# Patient Record
Sex: Female | Born: 1985 | Race: White | Hispanic: No | Marital: Married | Smoking: Never smoker
Health system: Southern US, Community
[De-identification: ages and names within clinical notes are randomized; demographics above are authoritative.]

## PROBLEM LIST (undated history)

## (undated) ENCOUNTER — Inpatient Hospital Stay: Payer: Self-pay

## (undated) DIAGNOSIS — G43909 Migraine, unspecified, not intractable, without status migrainosus: Secondary | ICD-10-CM

## (undated) DIAGNOSIS — Z9889 Other specified postprocedural states: Secondary | ICD-10-CM

## (undated) DIAGNOSIS — T7840XA Allergy, unspecified, initial encounter: Secondary | ICD-10-CM

## (undated) DIAGNOSIS — F419 Anxiety disorder, unspecified: Secondary | ICD-10-CM

## (undated) DIAGNOSIS — K219 Gastro-esophageal reflux disease without esophagitis: Secondary | ICD-10-CM

## (undated) DIAGNOSIS — C801 Malignant (primary) neoplasm, unspecified: Secondary | ICD-10-CM

## (undated) DIAGNOSIS — Z8614 Personal history of Methicillin resistant Staphylococcus aureus infection: Secondary | ICD-10-CM

## (undated) HISTORY — DX: Allergy, unspecified, initial encounter: T78.40XA

## (undated) HISTORY — PX: CHOLECYSTECTOMY: SHX55

## (undated) HISTORY — DX: Other specified postprocedural states: Z98.890

## (undated) HISTORY — PX: TONSILLECTOMY: SUR1361

---

## 2007-02-02 ENCOUNTER — Emergency Department: Payer: Self-pay | Admitting: Emergency Medicine

## 2007-05-24 ENCOUNTER — Observation Stay: Payer: Self-pay | Admitting: Obstetrics and Gynecology

## 2007-05-31 ENCOUNTER — Inpatient Hospital Stay (HOSPITAL_COMMUNITY): Admission: AD | Admit: 2007-05-31 | Discharge: 2007-05-31 | Payer: Self-pay | Admitting: Obstetrics and Gynecology

## 2007-05-31 ENCOUNTER — Encounter: Payer: Self-pay | Admitting: Emergency Medicine

## 2007-06-01 ENCOUNTER — Observation Stay: Payer: Self-pay | Admitting: Obstetrics and Gynecology

## 2007-06-25 ENCOUNTER — Observation Stay: Payer: Self-pay | Admitting: Obstetrics and Gynecology

## 2007-07-03 ENCOUNTER — Observation Stay: Payer: Self-pay

## 2007-07-05 ENCOUNTER — Observation Stay: Payer: Self-pay | Admitting: Certified Nurse Midwife

## 2007-07-06 ENCOUNTER — Observation Stay: Payer: Self-pay | Admitting: Certified Nurse Midwife

## 2007-07-10 ENCOUNTER — Inpatient Hospital Stay: Payer: Self-pay

## 2008-05-22 ENCOUNTER — Emergency Department: Payer: Self-pay | Admitting: Emergency Medicine

## 2008-05-24 ENCOUNTER — Emergency Department: Payer: Self-pay | Admitting: Emergency Medicine

## 2008-07-02 ENCOUNTER — Inpatient Hospital Stay: Payer: Self-pay | Admitting: Internal Medicine

## 2008-07-17 ENCOUNTER — Emergency Department: Payer: Self-pay | Admitting: Emergency Medicine

## 2008-09-14 IMAGING — CR DG KNEE 1-2V*L*
2 series · 2 of 2 positions shown · non-contrast
Comparison: none

CLINICAL DATA: Multiple trauma.
 LEFT KNEE ? 2 VIEW:

[view not recorded (1 of 2)]
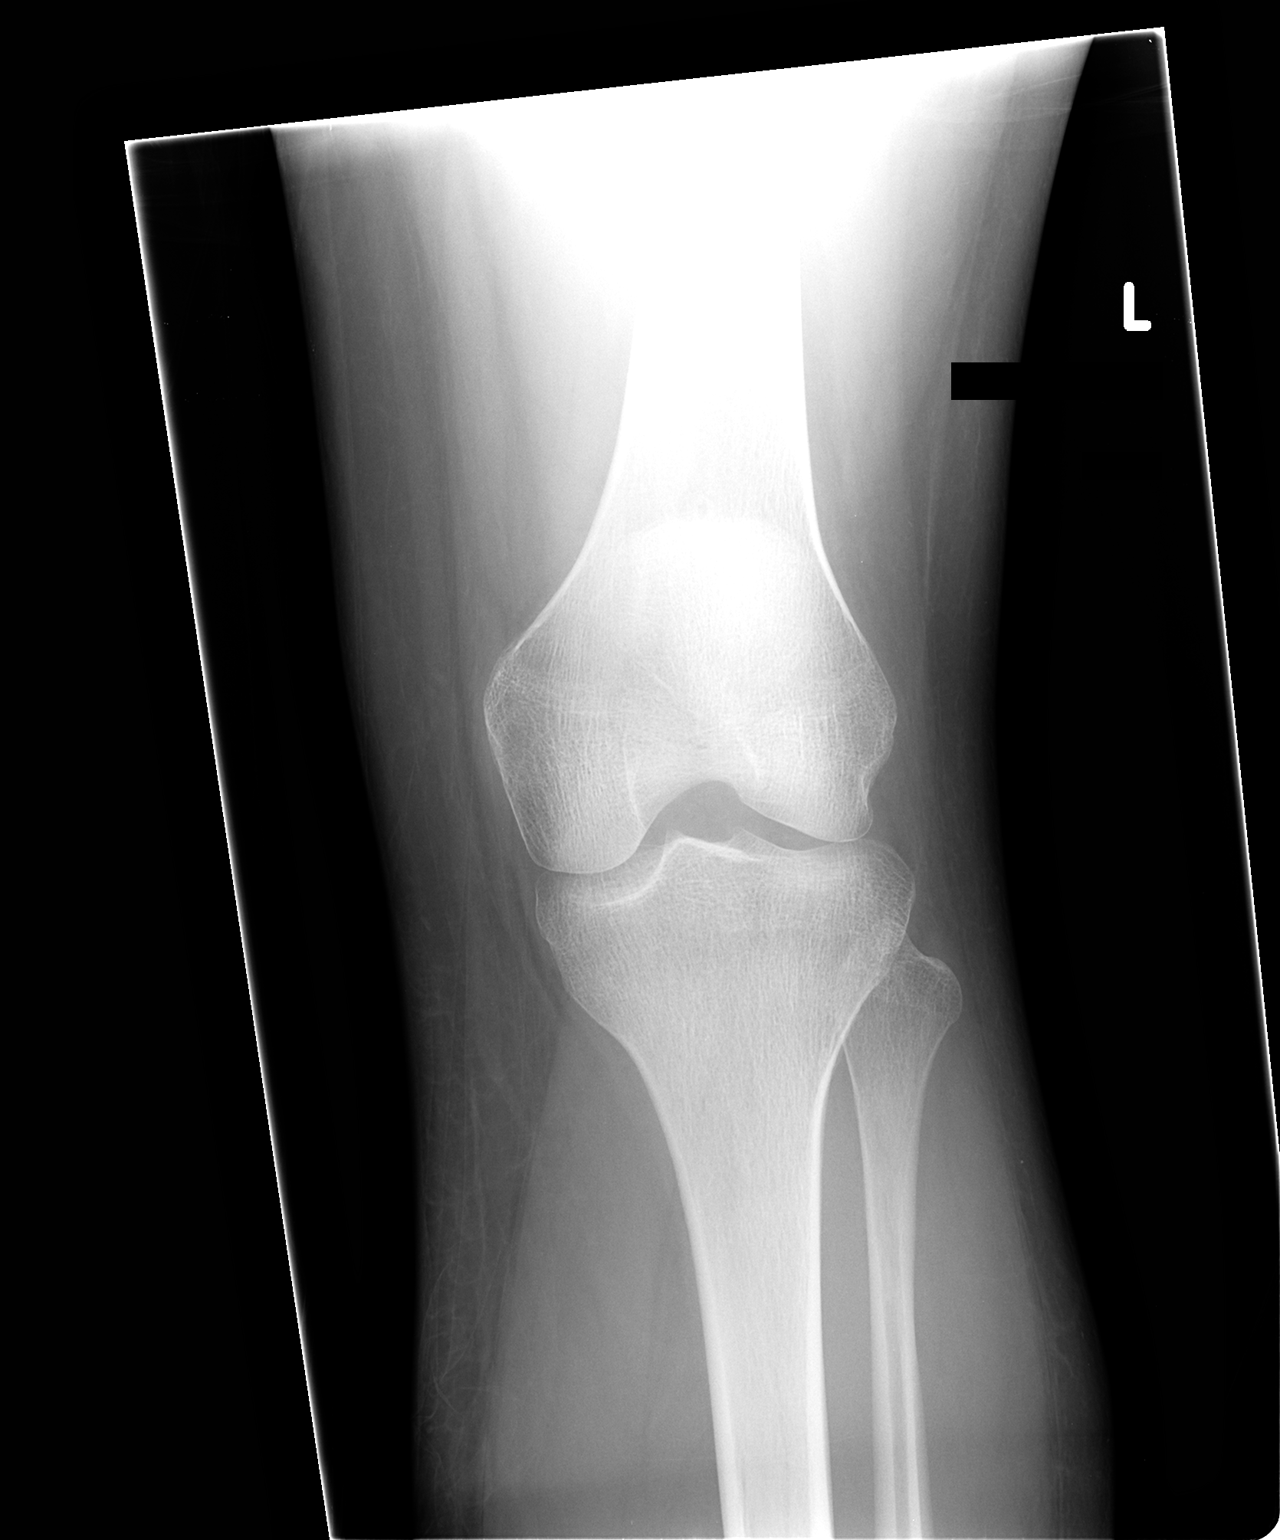

[view not recorded (2 of 2)]
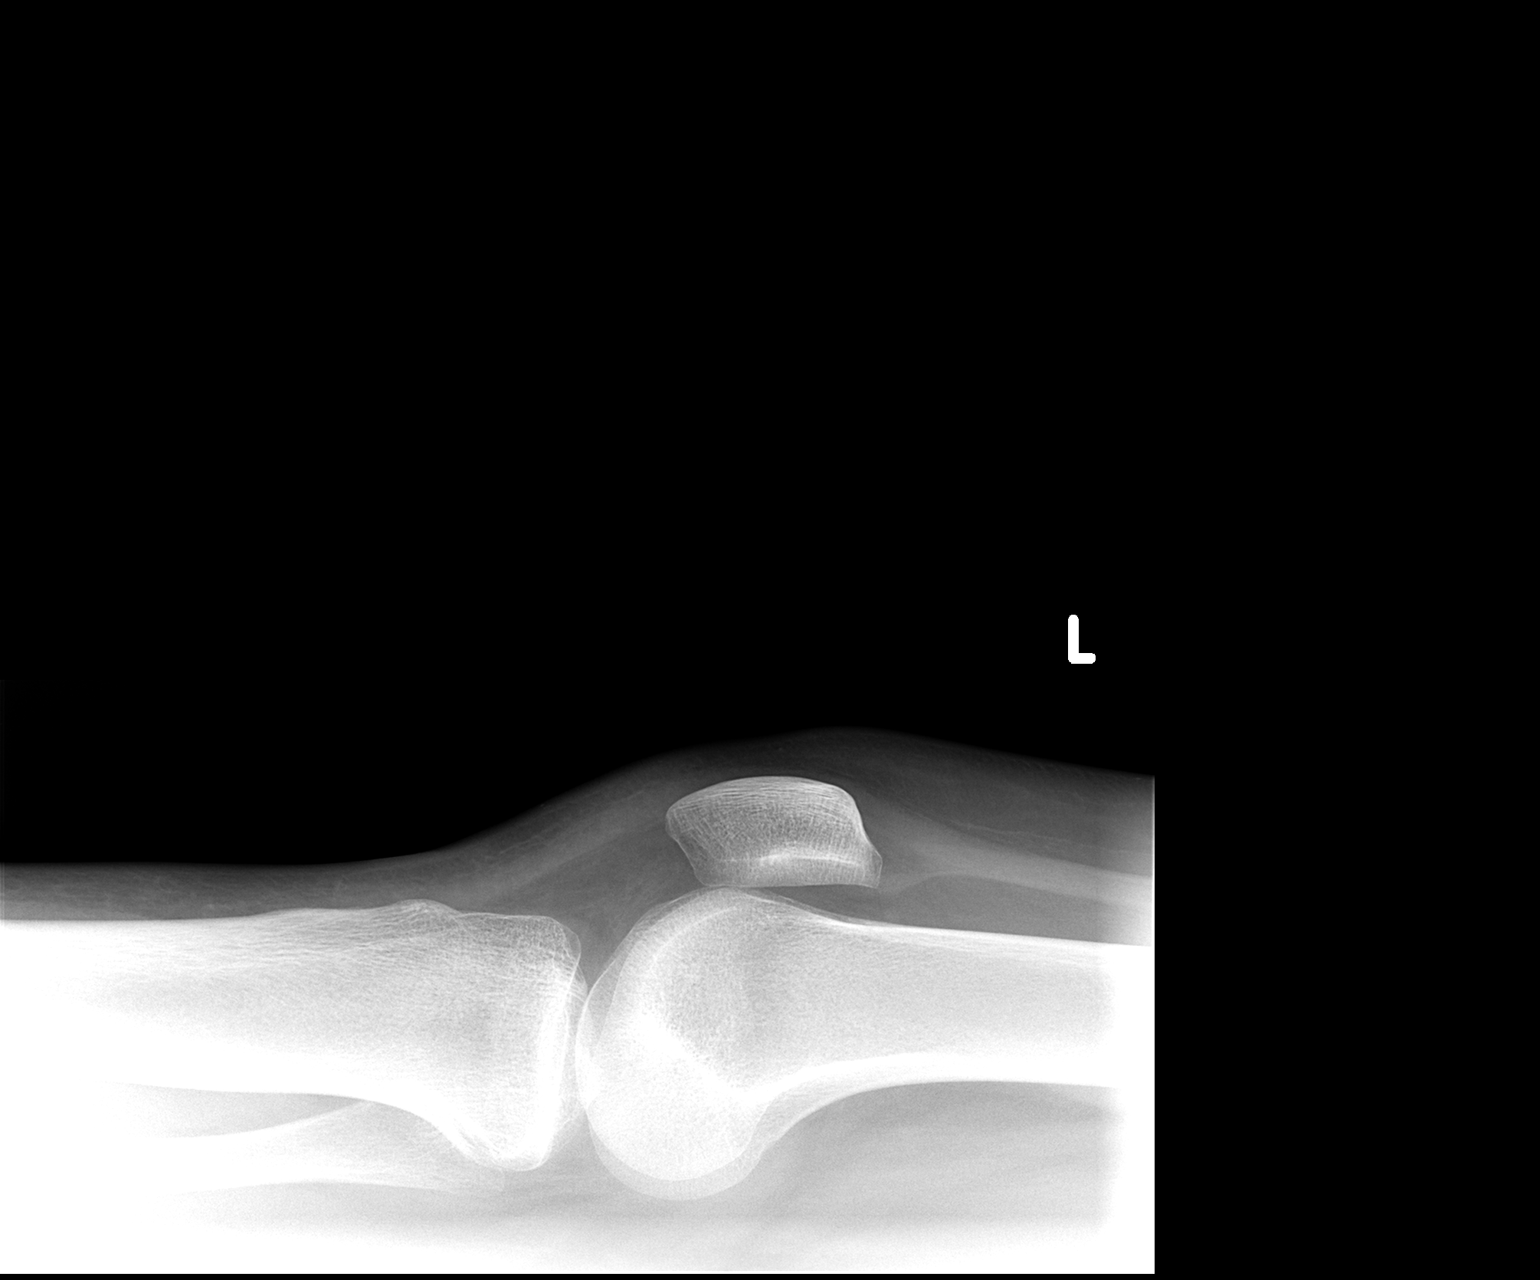

[2 of 2 positions shown; findings below may reference images not displayed]

FINDINGS: Two portable views of the right knee demonstrate joint spaces to be maintained.  No fractures are seen.  No joint effusion.
IMPRESSION: No acute bony findings.

## 2008-10-30 ENCOUNTER — Inpatient Hospital Stay: Payer: Self-pay | Admitting: Internal Medicine

## 2009-02-16 ENCOUNTER — Emergency Department: Payer: Self-pay | Admitting: Emergency Medicine

## 2011-06-28 LAB — CBC
MCHC: 34.4
Platelets: 303
RDW: 12.6

## 2011-06-28 LAB — I-STAT 8, (EC8 V) (CONVERTED LAB)
Acid-base deficit: 4 — ABNORMAL HIGH
Bicarbonate: 19.7 — ABNORMAL LOW
TCO2: 21
pCO2, Ven: 31.5 — ABNORMAL LOW
pH, Ven: 7.404 — ABNORMAL HIGH

## 2011-06-28 LAB — POCT I-STAT CREATININE: Operator id: 151321

## 2011-06-28 LAB — PROTIME-INR
INR: 0.9
Prothrombin Time: 12.5

## 2011-08-02 ENCOUNTER — Emergency Department: Payer: Self-pay | Admitting: Emergency Medicine

## 2012-03-29 ENCOUNTER — Emergency Department: Payer: Self-pay | Admitting: *Deleted

## 2014-02-04 ENCOUNTER — Emergency Department: Payer: Self-pay | Admitting: Emergency Medicine

## 2014-02-07 LAB — BETA STREP CULTURE(ARMC)

## 2014-10-20 ENCOUNTER — Emergency Department: Payer: Self-pay | Admitting: Emergency Medicine

## 2014-11-06 ENCOUNTER — Inpatient Hospital Stay: Payer: Self-pay | Admitting: Internal Medicine

## 2015-01-15 NOTE — Consult Note (Signed)
PATIENT NAME:  Alexandria Orozco, Alexandria Orozco MR#:  [id number] DATE OF BIRTH:  09-30-1985  DATE OF CONSULTATION:  11/06/2014  REFERRING PHYSICIAN:   CONSULTING PHYSICIAN:  Lupita Dawn. Koralyn Prestage, MD  REASON FOR REFERRAL: Acute abdominal pain with dilated common bile duct.   DESCRIPTION: The patient is a 29 year old white female with a known history of gallstone pancreatitis in 2009 followed by gallbladder surgery, then by ERCP after gallbladder surgery because of retained common bile duct stones. Biliary sphincterotomy with multiple stone extractions were done. The patient has been stable until now when she developed epigastric pain with nausea and vomiting for the past 2-3 days. Because of the worsening of symptoms the patient came to the Emergency Room for evaluation. During the workup ultrasound was done which showed a dilated common bile duct at 13 mm. White count was elevated at 11.7. I was asked to see the patient because of the symptoms. The patient remains nauseous and having persistent pain.   PAST MEDICAL HISTORY: Notable for gallstone pancreatitis, history of migraine headaches.   PAST SURGICAL HISTORY: Cholecystectomy and tonsillectomy.   ALLERGIES: SHE IS ALLERGIC TO PENICILLIN, SULFA, AND MORPHINE.   HOME MEDICATIONS: Imitrex.   FAMILY HISTORY: Negative for diabetes and heart condition.   SOCIAL HISTORY: Negative.   REVIEW OF SYSTEMS: No change from the admission. Please refer to this.   PHYSICAL EXAMINATION:  GENERAL: The patient is in no acute distress, but she is uncomfortable because of the pain.  VITAL SIGNS:  She is afebrile. Vital signs are stable except her heart rate is a little tachycardic. HEAD AND NECK: Within normal limits.  CARDIAC: Regular rhythm and rate.  LUNGS: Clear bilaterally.  ABDOMEN: Normoactive bowel sounds, soft. There is tenderness in the epigastrium. She had active bowel sounds. There is no hepatomegaly.  EXTREMITIES: No clubbing, cyanosis, or edema.     LABORATORY DATA:  In terms of her laboratories, electrolytes were completely normal except for chloride of 109 and glucose 119. Liver enzymes were completely normal. Troponin level is normal. Lipase was normal at 110. White count is slightly elevated at 11.7, hemoglobin 13.2, platelet count is 258,000.  An ultrasound did show dilated bile duct at 13 mm.   ASSESSMENT AND PLAN: This is a patient with acute epigastric pain, nausea, vomiting, who has had gallstones in the common bile duct before. The bile duct is dilated at 13 mm. Even though the liver enzymes are normal, I am concerned that there may be a retained stone present in the bile duct that is causing her symptoms. We will schedule an MRCP for tomorrow morning. If the MRCP is abnormal then we will proceed with ERCP.  I discussed the ERCP in detail with the patient.  Also discussed potential risks including recurrent pancreatitis, bleeding, reaction to sedation, etc.   Thank you for the referral.    ____________________________ Lupita Dawn. Candace Cruise, MD pyo:bu D: 11/09/2014 13:16:00 ET T: 11/09/2014 13:46:51 ET JOB#: [id number]  cc: Lupita Dawn. Candace Cruise, MD, <Dictator> Lupita Dawn Idonna Heeren MD ELECTRONICALLY SIGNED 11/14/2014 7:54

## 2015-01-15 NOTE — Consult Note (Signed)
Pt seen and examined. Full consult to follow. Pt with hx of gallstone pancreatitis in 2009, S/P GB surgery, and S/P ERCP after surgery to remove multiple CBD stones. Stable until few days ago, started having worsening epigastric pain with nausea. Thought LFT normal, CDB dilated to 41mm without obvious stones. Abdomen tender. Concern for possible retained CBD stone. Will order MRCP. Plan ERCP tomorrow afternoon if MRCP abnormal. Pt with PCN/sulfa allergy. Having pruritis from dilaudid? Will follow. thanks.  Electronic Signatures: Verdie Shire (MD)  (Signed on 21-Feb-16 10:29)  Authored  Last Updated: 21-Feb-16 10:29 by Verdie Shire (MD)

## 2015-01-15 NOTE — Consult Note (Signed)
MRCP showed no stone. However, pain persists. ERCP done. 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 Previous sphinterotomy  site has closed. No stone. Sphinterotomy done with good bile flow. Will see if pain subsides. Full liquid diet ordered. thanks.  Electronic Signatures: Verdie Shire (MD)  (Signed on 22-Feb-16 14:39)  Authored  Last Updated: 22-Feb-16 14:39 by Verdie Shire (MD)

## 2015-01-15 NOTE — Discharge Summary (Signed)
PATIENT NAME:  Alexandria Orozco, Alexandria Orozco MR#:  [id number] DATE OF BIRTH:  09/16/1986  DATE OF ADMISSION:  11/06/2014 DATE OF DISCHARGE:  11/12/2014  ADMITTING PHYSICIAN:  Juluis Mire, MD  DISCHARGING PHYSICIAN:   Gladstone Lighter, MD  PRIMARY CARE PHYSICIAN: None.   CONSULTATIONS IN THE HOSPITAL: GI consultation by Lupita Dawn. Candace Cruise, MD  DISCHARGE DIAGNOSES: 1.  Sepsis.  2.  Cholangitis.  3.  Post ERCP pancreatitis.  4.  Narrowed common bile duct, status post sphincterotomy.  5.  Migraine headaches.   DISCHARGE HOME MEDICATIONS:  1.  Imitrex 50 mg p.o. once a day as needed for migraines.  2.  Fioricet 1 tablet q. 8 hours p.r.n. for migraine prophylaxis.  3.  Levaquin 500 mg p.o. daily for 4 more days.  4.  Tramadol 50 mg p.o. q. 6 hours as needed for pain.   DISCHARGE DIET: Advised to start full liquid diet for the next 1-2 days before advancing to a regular diet.   DISCHARGE ACTIVITY: As tolerated.   FOLLOWUP INSTRUCTIONS: 1.  GI followup in 2-3 weeks if any concerns. 2.  PCP followup in 2 weeks.   LABORATORIES AND IMAGING STUDIES PRIOR TO DISCHARGE: Lipase was 526 prior to discharge.  WBC was normalized at 9.9, hemoglobin 10.6, hematocrit 31.8, platelet count 213,000. Sodium 143, potassium 3.5, chloride 107, bicarbonate 25, BUN 8, creatinine 0.68, glucose of 69, calcium of 7.9. ALT of 12, AST 24, alkaline phosphatase 49, total bilirubin 0.4, albumin of 2.8. Blood cultures remain negative. The patient had an MRCP done on 11/07/2014 showing no findings explaining the patient's extrahepatic biliary distention. No stones were seen, though the common bile duct was dilated at 13 mm and gradual tapering near the head of the pancreas noted. Lipase went as high as greater than 10,000 after her ERCP.   BRIEF HOSPITAL COURSE: Ms. Alexandria Orozco is a 28 year old young Caucasian female with past medical history significant for only migraine headaches, who presents to the hospital secondary to  worsening nausea, vomiting, and epigastric pain.  1.  CBD dilatation noted on ultrasound and also MRCP. The patient was status post cholecystectomy in the past. So, she was seen by GI and had an ERCP done on 11/07/2014, which showed that of unknown etiology, the bile duct was dilated and narrowed near the head of the pancreas, so she had sphincterotomy done without any stent placement. No stones were noted. Cholangiogram was normal.  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  Her symptoms, however, got worse after her procedure secondary to her pancreatitis from the procedure. She never had obstructive symptoms and her bilirubin and LFTs were within normal limits.  2.  Post ERCP pancreatitis. Developed pancreatitis post ERCP procedure. Lipase went as high as greater than 10,000 and now improved. The patient was able to tolerate a full liquid diet prior to discharge.  Lipase down to 500, but clinically much better.  3.  Possible sepsis secondary to cholangitis. The patient developed post ERCP pancreatitis while she was being treated.  She developed fevers, chills, tachycardia, and elevated white count. She was started on antibiotics, which improved her symptoms along with treatment of her pancreatitis. She developed likely cholangitis. Blood cultures remained negative. She is being discharged on Levaquin to finish up the course. 4.  Migraine headaches on Imitrex and Fioricet p.r.n.   Her course has been otherwise uneventful in the hospital.   DISCHARGE CONDITION: Stable.   DISCHARGE DISPOSITION: Home.   TIME SPENT ON DISCHARGE: 45 minutes.  ____________________________ Gladstone Lighter, MD rk:LT D: 11/13/2014 11:53:00 ET T: 11/13/2014 21:10:30 ET JOB#: [id number]  cc: Gladstone Lighter, MD, <Dictator> Lupita Dawn. Candace Cruise, MD Gladstone Lighter MD ELECTRONICALLY SIGNED 12/01/2014 17:50

## 2015-01-15 NOTE — Consult Note (Signed)
Pt improving, still mildly elevated lipase, some epigastric abd pain, not as bad as yesterday.  Starting clear liquids today, using incentive spirometry well.  I discussed recommended things she should do and not do after discharge.  I think she likely could go home tomorrow on full liquid diet and oral pain meds and nausea meds and call Monday for follow up appt with Dr. Candace Cruise.  She has seen Dr. Allen Norris in the past and I mentioned she could go with either doctor and she will see Dr. Candace Cruise since he is the last one she saw.  Electronic Signatures: Manya Silvas (MD)  (Signed on 26-Feb-16 14:14)  Authored  Last Updated: 26-Feb-16 14:14 by Manya Silvas (MD)

## 2015-01-15 NOTE — Consult Note (Signed)
Pt with post ERCP pancreatitis with drop in lipase from 10,000 to 1000.  Still with pain but trying not to take pain meds.  She has abd pain with deep breathing or coughing.  PE with clear lung base and nl heart exam, tender in upper abdomen.  Still not taking anything except sips of water.  I would not be too quick about discharge until she can eat without pain.  Doubt very much she will be ready for discharge tomorrow .Marland KitchenI am covering for Dr. Candace Cruise today.  Electronic Signatures: Manya Silvas (MD)  (Signed on 25-Feb-16 15:43)  Authored  Last Updated: 25-Feb-16 15:43 by Manya Silvas (MD)

## 2015-01-15 NOTE — Consult Note (Signed)
Chief Complaint:  Subjective/Chief Complaint Still with abdominal pain and nausea. Fever as well.   VITAL SIGNS/ANCILLARY NOTES: **Vital Signs.:   24-Feb-16 08:10  Vital Signs Type Q 8hr  Temperature Temperature (F) 102.7  Celsius 39.2  Temperature Source oral  Pulse Pulse 122  Respirations Respirations 22  Systolic BP Systolic BP 462  Diastolic BP (mmHg) Diastolic BP (mmHg) 70  Mean BP 80  Pulse Ox % Pulse Ox % 64  Pulse Ox Activity Level  At rest  Oxygen Delivery Room Air/ 21 %    10:32  Temperature Temperature (F) 98.8  Celsius 37.1  Temperature Source oral  Pulse Pulse 93  Pulse Ox % Pulse Ox % 95  Pulse Ox Activity Level  At rest  Oxygen Delivery 2L; Nasal Cannula   Brief Assessment:  GEN no acute distress   Cardiac Regular   Respiratory clear BS   Gastrointestinal decreased bowel sounds. Mildly tender.   Lab Results: Routine Chem:  23-Feb-16 20:03   Lipase  > 10000 (Result(s) reported on 08 Nov 2014 at 08:37PM.)  24-Feb-16 05:20   Lipase  9922 (Result(s) reported on 09 Nov 2014 at 06:36AM.)   Assessment/Plan:  Assessment/Plan:  Assessment Post ERCP pancreatitis. Unusual since pancreatic duct was never cannulated.   Plan Anyway, continue NPO. Increase IVF. Pain meds. Moniter lipase. Consider CT of pancreas if no clinical improvement over next few days. I have to be out next few days. Will have Dr. Vira Agar check on patient tomorrow. Thanks.   Electronic Signatures: Verdie Shire (MD)  (Signed 24-Feb-16 11:50)  Authored: Chief Complaint, VITAL SIGNS/ANCILLARY NOTES, Brief Assessment, Lab Results, Assessment/Plan   Last Updated: 24-Feb-16 11:50 by Verdie Shire (MD)

## 2015-01-15 NOTE — H&P (Signed)
PATIENT NAME:  Alexandria Orozco, Alexandria Orozco MR#:  [id number] DATE OF BIRTH:  23-Aug-1986  DATE OF ADMISSION:  11/06/2014  REFERRING PHYSICIAN: Elta Guadeloupe R. Jacqualine Code, M.D.   PRIMARY CARE PRACTITIONER: Nonlocal.   ADMITTING DOCTOR: Juluis Mire, M.D.   CHIEF COMPLAINT: Epigastric pain with associated nausea and vomiting of 2 days' duration.   HISTORY OF PRESENT ILLNESS: A 29 year old Caucasian female with a past medical history of gallstone pancreatitis status post cholecystectomy in the remote past, presents with the complaints of ongoing epigastric pain with associated nausea and vomiting of 2 days' duration. No history of any blood in the vomitus. No fever. No chills. Denies any chest pain. No shortness of breath. No dysuria, frequency, or urgency. In the Emergency Room, the patient was evaluated by the ED physician and was found to have  blood work which was essentially negative  except for a mildly elevated white blood cell count of 11.7. She underwent an ultrasound of the abdomen, which revealed dilated common bile duct and dilated at extrahepatic bile ducts. Surgery on call was contacted by the ED physician and advised recommendation to admit the patient to the medical team, and have a gastroenterology consultation. Hence, the hospitalist was consulted for further evaluation and management. The patient received  IV pain medications in the Emergency Room and currently her pain is under control. Her nausea is also under control with IV Zofran.   PAST MEDICAL HISTORY:  1.  Gallstone pancreatitis status post cholecystectomy in the past.  2.  Migraine headaches.   PAST SURGICAL HISTORY:  1.  Cholecystectomy.  2.  Tonsillectomy.   ALLERGIES: PENICILLIN, MORPHINE, AND SULFA.   HOME MEDICATIONS: Imitrex as needed for migraine headaches.   FAMILY HISTORY: Mom with diabetes mellitus and father with heart problems.   SOCIAL HISTORY: She is single and works at [employer]. She denies any history of smoking,  alcohol, or substance abuse.   REVIEW OF SYSTEMS:  CONSTITUTIONAL: Negative for fever, chills, fatigue, or generalized weakness.  EYES: Negative for blurred vision, double vision. No pain. No redness. No discharge.  EARS, NOSE, AND THROAT: Negative for any pain, hearing loss, epistaxis, or nasal discharge. RESPIRATORY: Negative for cough, wheezing, dyspnea, hemoptysis, or painful respiration.  CARDIOVASCULAR: Negative for chest pain, palpitations, dizziness, syncopal episodes, orthopnea, dyspnea on exertion, or pedal edema.  GASTROINTESTINAL: Positive for nausea, vomiting with epigastric pain as noted in the history of present illness of 2 day duration. No hematemesis. No diarrhea. No rectal bleeding.  GENITOURINARY: Negative for dysuria, frequency, urgency, or hematuria.  ENDOCRINE: Negative for polyuria, nocturia, heat or cold intolerance.  HEMATOLOGIC AND LYMPHATIC: Negative for anemia, easy bruising, bleeding, or swollen glands.  INTEGUMENTARY: Negative for acne, skin lesions, or rash.  MUSCULOSKELETAL: Negative for arthritis and gout.  NEUROLOGICAL: Negative for focal weakness, numbness. No history of CVA, transient ischemic attack, or seizure disorder.  PSYCHIATRIC: Negative for anxiety, insomnia, or depression.   PHYSICAL EXAMINATION:  VITAL SIGNS: Temperature 99.2, pulse rate 108 per minute, respirations 18 per minute, blood pressure 140/70.  GENERAL: Well-developed, well-nourished, alert, in no acute distress, comfortably resting in the bed at this time.  HEENT: Head atraumatic and normocephalic.  EYES: Pupils are equal, react to light and accommodation. No conjunctival pallor. No icterus. Extraocular movements intact.  NOSE: No drainage. No lesions.  EARS: No drainage. No external lesions.  ORAL CAVITY: No mucositis. No exudates.  NECK: Supple. No JVD. No thyromegaly. No carotid bruit. Range of motion of neck within normal limits.  RESPIRATORY:  Good respiratory effort. Not using  accessory muscles of respiration. Bilateral vesicular breath sounds. No rales or rhonchi.  CARDIOVASCULAR: S1, S2 regular. No murmurs, gallops, or clicks. Peripheral pulses are equal.  Carotid femoral pedal pulses, no peripheral edema.  GASTROINTESTINAL: Abdomen soft. Mild epigastric tenderness present. No guarding. No rigidity. Bowel sounds present and now clean and equal in all 4 quadrants.  GENITOURINARY: Deferred.  MUSCULOSKELETAL: No joint tenderness or effusion. Range of motion is adequate. Strength and tone equal bilaterally.  SKIN: Inspection within normal limits. No obvious wounds.  LYMPHATIC: No cervical lymphadenopathy.  VASCULAR: Good dorsalis pedis and posterior tibial pulses.  NEUROLOGICAL: Alert, awake, and oriented x3. Cranial nerves II-XII grossly intact. No sensory deficits.  Motor strength 5/5 in both upper and lower extremities. Plantars downgoing.  PSYCHIATRIC: Alert, awake, and oriented x3. Judgment and insight adequate. Memory and mood within normal limits.   ANCILLARY DATA:  LABORATORY DATA: Serum glucose 119, BUN 14, creatinine 0.93, sodium 141, potassium 3.6, chloride 109, bicarbonate 24, total calcium 8.4, lipase 110, total protein 7.5, albumin 3.6, total bilirubin 0.2, alkaline phosphatase 67, AST 26, ALT 18. Troponin less than 0.02. WBC 11.7, hemoglobin 13.2, hematocrit 38.1, platelet count 258,000. Urinalysis: Bacteria none, leukocyte esterase 1+, nitrite negative. WBC is 2.  IMAGING STUDIES: Ultrasound of the abdomen and right upper quadrant shows cholecystectomy, common bile duct enlargement of 13 mm, dilated extrahepatic bile duct of 13 mm.    EKG: Normal sinus rhythm with ventricular rate of 94 beats per minute, incomplete right bundle branch block.  ASSESSMENT AND PLAN: The patient is a 29 year old Caucasian female with a history of gallstone pancreatitis status post cholecystectomy in the remote past, now presents with the complaints of nausea, vomiting,  epigastric pain of 2 day duration, found to have common bile duct dilatation on ultrasound of abdomen 1.  Epigastric pain secondary to common bile duct stones with  dilation found on ultrasound of the abdomen.  Plan: Admit to medical floor, keep her n.p.o. except medications, IV fluids, IV pain control medications, and GI consultation for further evaluation.  2.  Deep vein thrombosis prophylaxis. Subcutaneous heparin. 3.  Gastrointestinal prophylaxis: Proton pump inhibitor.   CODE STATUS: Full code.   TIME SPENT: 45 minutes.    ____________________________ Juluis Mire, MD enr:mc D: 11/06/2014 07:13:40 ET T: 11/06/2014 11:15:45 ET JOB#: [id number]  cc: Juluis Mire, MD, <Dictator> Juluis Mire MD ELECTRONICALLY SIGNED 11/06/2014 14:27

## 2015-09-01 ENCOUNTER — Ambulatory Visit
Admission: EM | Admit: 2015-09-01 | Discharge: 2015-09-01 | Disposition: A | Discharge status: Home / Self Care | Payer: 59 | Attending: Family Medicine | Admitting: Family Medicine

## 2015-09-01 DIAGNOSIS — J069 Acute upper respiratory infection, unspecified: Secondary | ICD-10-CM | POA: Diagnosis not present

## 2015-09-01 LAB — HCG, QUANTITATIVE, PREGNANCY

## 2015-09-01 LAB — PREGNANCY, URINE: PREG TEST UR: NEGATIVE

## 2015-09-01 MED ORDER — BENZONATATE 100 MG PO CAPS: 100.0000 mg | ORAL_CAPSULE | Freq: Three times a day (TID) | ORAL | Status: DC | PRN

## 2015-09-01 MED ORDER — LORATADINE-PSEUDOEPHEDRINE ER 5-120 MG PO TB12: 1.0000 | ORAL_TABLET | Freq: Two times a day (BID) | ORAL | Status: AC

## 2015-09-01 NOTE — ED Notes (Signed)
Started Tuesday with cough. Yesterday noticed intermittent shooting pain just below left ear.

## 2015-09-01 NOTE — Discharge Instructions (Signed)
Rest, eat and drink regularly.   Follow up with your primary care physician this week as needed. Return to Urgent care for new or worsening concerns.   Upper Respiratory Infection, Adult Most upper respiratory infections (URIs) are a viral infection of the air passages leading to the lungs. A URI affects the nose, throat, and upper air passages. The most common type of URI is nasopharyngitis and is typically referred to as "the common cold." URIs run their course and usually go away on their own. Most of the time, a URI does not require medical attention, but sometimes a bacterial infection in the upper airways can follow a viral infection. This is called a secondary infection. Sinus and middle ear infections are common types of secondary upper respiratory infections. Bacterial pneumonia can also complicate a URI. A URI can worsen asthma and chronic obstructive pulmonary disease (COPD). Sometimes, these complications can require emergency medical care and may be life threatening.  CAUSES Almost all URIs are caused by viruses. A virus is a type of germ and can spread from one person to another.  RISKS FACTORS You may be at risk for a URI if:   You smoke.   You have chronic heart or lung disease.  You have a weakened defense (immune) system.   You are very young or very old.   You have nasal allergies or asthma.  You work in crowded or poorly ventilated areas.  You work in health care facilities or schools. SIGNS AND SYMPTOMS  Symptoms typically develop 2-3 days after you come in contact with a cold virus. Most viral URIs last 7-10 days. However, viral URIs from the influenza virus (flu virus) can last 14-18 days and are typically more severe. Symptoms may include:   Runny or stuffy (congested) nose.   Sneezing.   Cough.   Sore throat.   Headache.   Fatigue.   Fever.   Loss of appetite.   Pain in your forehead, behind your eyes, and over your cheekbones (sinus  pain).  Muscle aches.  DIAGNOSIS  Your health care provider may diagnose a URI by:  Physical exam.  Tests to check that your symptoms are not due to another condition such as:  Strep throat.  Sinusitis.  Pneumonia.  Asthma. TREATMENT  A URI goes away on its own with time. It cannot be cured with medicines, but medicines may be prescribed or recommended to relieve symptoms. Medicines may help:  Reduce your fever.  Reduce your cough.  Relieve nasal congestion. HOME CARE INSTRUCTIONS   Take medicines only as directed by your health care provider.   Gargle warm saltwater or take cough drops to comfort your throat as directed by your health care provider.  Use a warm mist humidifier or inhale steam from a shower to increase air moisture. This may make it easier to breathe.  Drink enough fluid to keep your urine clear or pale yellow.   Eat soups and other clear broths and maintain good nutrition.   Rest as needed.   Return to work when your temperature has returned to normal or as your health care provider advises. You may need to stay home longer to avoid infecting others. You can also use a face mask and careful hand washing to prevent spread of the virus.  Increase the usage of your inhaler if you have asthma.   Do not use any tobacco products, including cigarettes, chewing tobacco, or electronic cigarettes. If you need help quitting, ask your health care  provider. PREVENTION  The best way to protect yourself from getting a cold is to practice good hygiene.   Avoid oral or hand contact with people with cold symptoms.   Wash your hands often if contact occurs.  There is no clear evidence that vitamin C, vitamin E, echinacea, or exercise reduces the chance of developing a cold. However, it is always recommended to get plenty of rest, exercise, and practice good nutrition.  SEEK MEDICAL CARE IF:   You are getting worse rather than better.   Your symptoms are  not controlled by medicine.   You have chills.  You have worsening shortness of breath.  You have brown or red mucus.  You have yellow or brown nasal discharge.  You have pain in your face, especially when you bend forward.  You have a fever.  You have swollen neck glands.  You have pain while swallowing.  You have white areas in the back of your throat. SEEK IMMEDIATE MEDICAL CARE IF:   You have severe or persistent:  Headache.  Ear pain.  Sinus pain.  Chest pain.  You have chronic lung disease and any of the following:  Wheezing.  Prolonged cough.  Coughing up blood.  A change in your usual mucus.  You have a stiff neck.  You have changes in your:  Vision.  Hearing.  Thinking.  Mood. MAKE SURE YOU:   Understand these instructions.  Will watch your condition.  Will get help right away if you are not doing well or get worse.   This information is not intended to replace advice given to you by your health care provider. Make sure you discuss any questions you have with your health care provider.   Document Released: 02/26/2001 Document Revised: 01/17/2015 Document Reviewed: 12/08/2013 Elsevier Interactive Patient Education [employer].

## 2015-09-01 NOTE — ED Provider Notes (Signed)
[facility]  ____________________________________________  Time seen: Approximately 1:22 PM  I have reviewed the triage vital signs and the nursing notes.   HISTORY  Chief Complaint URI   HPI Alexandria Orozco is a 29 y.o. female  presents with spouse at bedside for the complaints of 3 days of runny nose, and nasal congestion, cough. Patient reports continues to eat and drink well. Denies and fevers. Denies current pain. Patient does report intermittent pain in bilateral ears left greater than right. States ear pain at max is 5 out of 10. Reports has not taken any over-the-counter medications for this.  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 Denies chest pain, shortness breath, headache, dizziness, neck or back pain. Denies dysuria or vaginal complaints. Denies abdominal pain.  Patient does report that her last menstrual was October 21 of this year, and reports not currently on any means to also control menstruals. Patient does report her and her spouse are actively trying to conceive. Reports she normally has a menstrual cycle every month but occasionally in the past her menstruals will be "off" and will not have a menstrual every month. Reports that she has had negative home pregnancy test.  History reviewed. No pertinent past medical history.  There are no active problems to display for this patient.  PCP: [facility]  Past Surgical History  Procedure Laterality Date  . Cholecystectomy    . Tonsillectomy      No current outpatient prescriptions on file.  Allergies Morphine and related; Penicillins; and Sulfa antibiotics  History reviewed. No pertinent family history.  Social History Social History  Substance Use Topics  . Smoking status: Never Smoker   . Smokeless tobacco: None  . Alcohol Use: No     Review of Systems Constitutional: No fever/chills Eyes: No visual changes. ENT:  positive runny nose, nasal congestion, cough. Denies sore throat.    Cardiovascular: Denies chest pain. Respiratory: Denies shortness of breath. Gastrointestinal: No abdominal pain.  No nausea, no vomiting.  No diarrhea.  No constipation. Genitourinary: Negative for dysuria. Musculoskeletal: Negative for back pain. Skin: Negative for rash. Neurological: Negative for headaches, focal weakness or numbness.  10-point ROS otherwise negative.  ____________________________________________   PHYSICAL EXAM:  VITAL SIGNS: ED Triage Vitals  Enc Vitals Group     BP 09/01/15 1251 127/80 mmHg     Pulse Rate 09/01/15 1251 98     Resp 09/01/15 1251 16     Temp 09/01/15 1251 99.4 F (37.4 C)     Temp Source 09/01/15 1251 Tympanic     SpO2 09/01/15 1251 100 %     Weight 09/01/15 1251 165 lb (74.844 kg)     Height 09/01/15 1251 5' (1.524 m)     Head Cir --      Peak Flow --      Pain Score 09/01/15 1255 5     Pain Loc --      Pain Edu? --      Excl. in [place]? --     Constitutional: Alert and oriented. Well appearing and in no acute distress. Eyes: Conjunctivae are normal. PERRL. EOMI. Head: Atraumatic. No frontal or maxillary sinus tenderness to palpation. No swelling. No erythema.  Ears: no erythema, normal TMs bilaterally. No exudate or drainage bilaterally.  Nose: Mild clear rhinorrhea with bilateral nasal turbinate erythema. Nares patent.  Mouth/Throat: Mucous membranes are moist.  Oropharynx non-erythematous. No swelling. No exudate.  No uvular shift or deviation. Neck: No stridor.  No cervical spine tenderness to palpation. Hematological/Lymphatic/Immunilogical: No cervical lymphadenopathy. Cardiovascular: Normal rate, regular rhythm. Grossly normal heart sounds.  Good peripheral circulation. Respiratory: Normal respiratory effort.  No retractions. Lungs CTAB. No wheezes, rales or rhonchi. Good air movement. Gastrointestinal: Soft and nontender. No distention. Normal Bowel sounds.    Musculoskeletal: No lower or upper extremity tenderness nor edema. Bilateral pedal pulses equal and easily palpated.  Neurologic:  Normal speech and language. No gross focal neurologic deficits are appreciated. No gait instability. Skin:  Skin is warm, dry and intact. No rash noted. Psychiatric: Mood and affect are normal. Speech and behavior are normal.  ____________________________________________   LABS (all labs ordered are listed, but only abnormal results are displayed)  Labs Reviewed  PREGNANCY, URINE  HCG, QUANTITATIVE, PREGNANCY     INITIAL IMPRESSION / [place] / ED COURSE  Pertinent labs & imaging results that were available during my care of the patient were reviewed by me and considered in my medical decision making (see chart for details).  Very well-appearing patient. No acute distress. Presents for complaint of 3 days of runny nose, nasal congestion and intermittent cough. Denies chest pain, shortness of breath or other complaints. Moist mucous membranes. Denies fevers. Lungs clear throughout. Abdomen soft and nontender. Suspect viral upper respiratory infection. Will plan to treat supportively and symptomatically. Patient also reports no menstrual since October and states that this is atypical for her. Will also evaluate hCG as patient reports she is actively trying to conceive.  Patient reports that she has to return to work prior to being able to wait for results of hCG. Patient states that she will call back for result.  Discussed follow up with Primary care physician this week. Discussed follow up and return parameters including no resolution or any worsening concerns. Encouraged patient to follow-up with her PCP or OB/GYN regarding abnormal menstrual. Patient verbalized understanding and agreed to plan. .  ____________________________________________   FINAL CLINICAL IMPRESSION(S) / ED DIAGNOSES  Final diagnoses:  URI (upper respiratory infection)     Addendum: 09/01/2015 1500  Serum hCG less than 1.   Patient called to follow-up on lab results, discuss lab results with patient. Also as discussed suspect viral upper respiratory infection and will treat symptomatically with oral Claritin D as well as when necessary Tessalon Perles. Encouraged food, fluids, rest and PCP follow-up.Discussed follow up with Primary care physician this week. Discussed follow up and return parameters including no resolution or any worsening concerns. Patient verbalized understanding and agreed to plan.     Marylene Land, NP 09/01/15 [place], NP 09/01/15 1536

## 2015-09-17 NOTE — L&D Delivery Note (Signed)
Delivery Note At 6:09 PM a viable female was delivered via Vaginal, Spontaneous Delivery (Presentation:ROA  ).  APGAR:pending; weight  pending.   Placenta status: spontaneous, intact.  Cord: 3VC without complications: .  Cord pH: N/A  Anesthesia:  Epidural Episiotomy:  none Lacerations:  none Suture Repair: N/A Est. Blood Loss (mL):  430mL  828mcg of cytotec placed rectally for some mild atony  Mom to postpartum.  Baby to Couplet care / Skin to Skin.  Alexandria Orozco, [place] 08/08/2016, 6:26 PM

## 2015-12-27 LAB — OB RESULTS CONSOLE HIV ANTIBODY (ROUTINE TESTING): HIV: NONREACTIVE

## 2015-12-28 LAB — OB RESULTS CONSOLE VARICELLA ZOSTER ANTIBODY, IGG: VARICELLA IGG: IMMUNE

## 2015-12-28 LAB — OB RESULTS CONSOLE ANTIBODY SCREEN: ANTIBODY SCREEN: NEGATIVE

## 2015-12-28 LAB — OB RESULTS CONSOLE ABO/RH: RH TYPE: POSITIVE

## 2015-12-28 LAB — OB RESULTS CONSOLE RUBELLA ANTIBODY, IGM: RUBELLA: IMMUNE

## 2015-12-28 LAB — OB RESULTS CONSOLE HEPATITIS B SURFACE ANTIGEN: HEP B S AG: NEGATIVE

## 2016-03-05 ENCOUNTER — Encounter: Payer: Self-pay | Admitting: Emergency Medicine

## 2016-03-05 ENCOUNTER — Emergency Department
Admission: EM | Admit: 2016-03-05 | Discharge: 2016-03-05 | Disposition: A | Discharge status: Home / Self Care | Payer: 59 | Attending: Emergency Medicine | Admitting: Emergency Medicine

## 2016-03-05 DIAGNOSIS — O2390 Unspecified genitourinary tract infection in pregnancy, unspecified trimester: Secondary | ICD-10-CM | POA: Diagnosis not present

## 2016-03-05 DIAGNOSIS — O26811 Pregnancy related exhaustion and fatigue, first trimester: Secondary | ICD-10-CM | POA: Diagnosis present

## 2016-03-05 DIAGNOSIS — B9689 Other specified bacterial agents as the cause of diseases classified elsewhere: Secondary | ICD-10-CM | POA: Insufficient documentation

## 2016-03-05 DIAGNOSIS — R8271 Bacteriuria: Secondary | ICD-10-CM

## 2016-03-05 DIAGNOSIS — Z3A17 17 weeks gestation of pregnancy: Secondary | ICD-10-CM | POA: Insufficient documentation

## 2016-03-05 DIAGNOSIS — O9989 Other specified diseases and conditions complicating pregnancy, childbirth and the puerperium: Secondary | ICD-10-CM

## 2016-03-05 DIAGNOSIS — R42 Dizziness and giddiness: Secondary | ICD-10-CM | POA: Insufficient documentation

## 2016-03-05 HISTORY — DX: Migraine, unspecified, not intractable, without status migrainosus: G43.909

## 2016-03-05 LAB — URINALYSIS COMPLETE WITH MICROSCOPIC (ARMC ONLY)
BILIRUBIN URINE: NEGATIVE
Glucose, UA: NEGATIVE mg/dL
Hgb urine dipstick: NEGATIVE
KETONES UR: NEGATIVE mg/dL
NITRITE: NEGATIVE
PH: 8 (ref 5.0–8.0)
PROTEIN: NEGATIVE mg/dL
SPECIFIC GRAVITY, URINE: 1.006 (ref 1.005–1.030)

## 2016-03-05 LAB — BASIC METABOLIC PANEL
ANION GAP: 6 (ref 5–15)
BUN: 7 mg/dL (ref 6–20)
CALCIUM: 9.4 mg/dL (ref 8.9–10.3)
CO2: 23 mmol/L (ref 22–32)
CREATININE: 0.6 mg/dL (ref 0.44–1.00)
Chloride: 108 mmol/L (ref 101–111)
Glucose, Bld: 103 mg/dL — ABNORMAL HIGH (ref 65–99)
Potassium: 3.4 mmol/L — ABNORMAL LOW (ref 3.5–5.1)
SODIUM: 137 mmol/L (ref 135–145)

## 2016-03-05 LAB — CBC
HCT: 35.4 % (ref 35.0–47.0)
HEMOGLOBIN: 12.5 g/dL (ref 12.0–16.0)
MCH: 31.6 pg (ref 26.0–34.0)
MCHC: 35.4 g/dL (ref 32.0–36.0)
MCV: 89.1 fL (ref 80.0–100.0)
PLATELETS: 274 10*3/uL (ref 150–440)
RBC: 3.97 MIL/uL (ref 3.80–5.20)
RDW: 13 % (ref 11.5–14.5)
WBC: 7.7 10*3/uL (ref 3.6–11.0)

## 2016-03-05 MED ORDER — DEXTROSE 5 % IV SOLN
1.0000 g | Freq: Once | INTRAVENOUS | Status: AC
  Administered 2016-03-05: 1 g via INTRAVENOUS
  Filled 2016-03-05: qty 10

## 2016-03-05 MED ORDER — CEPHALEXIN 500 MG PO CAPS: 500.0000 mg | ORAL_CAPSULE | Freq: Three times a day (TID) | ORAL | Status: AC

## 2016-03-05 MED ORDER — SODIUM CHLORIDE 0.9 % IV BOLUS (SEPSIS)
1000.0000 mL | Freq: Once | INTRAVENOUS | Status: AC
Start: 2016-03-05 — End: 2016-03-05
  Administered 2016-03-05: 1000 mL via INTRAVENOUS

## 2016-03-05 NOTE — ED Provider Notes (Signed)
CSN: [id number]     Arrival date & time 03/05/16  [id number] History   First MD Initiated Contact with Patient 03/05/16 1211     Chief Complaint  Patient presents with  . Dizziness     (Consider location/radiation/quality/duration/timing/severity/associated sxs/prior Treatment) The history is provided by the patient.  Alexandria Orozco is a 30 y.o. female [redacted] weeks pregnant here presenting with dizziness. She states that she feels dizzy like she is on pass out since yesterday. She also has some lower abdominal cramps but denies any vaginal bleeding. Denies any chest pain or shortness breath. She states that she has been eating and drinking well and has not have any vomiting. Denies any fevers or chills or urinary symptoms.   Past Medical History  Diagnosis Date  . Migraines    Past Surgical History  Procedure Laterality Date  . Cholecystectomy    . Tonsillectomy     History reviewed. No pertinent family history. Social History  Substance Use Topics  . Smoking status: Never Smoker   . Smokeless tobacco: None  . Alcohol Use: No   OB History    Gravida Para Term Preterm AB TAB SAB Ectopic Multiple Living   1              Review of Systems  Neurological: Positive for dizziness.  All other systems reviewed and are negative.     Allergies  Morphine and related; Penicillins; and Sulfa antibiotics  Home Medications   Prior to Admission medications   Medication Sig Start Date End Date Taking? Authorizing Provider  benzonatate (TESSALON PERLES) 100 MG capsule Take 1 capsule (100 mg total) by mouth 3 (three) times daily as needed for cough. 09/01/15   Marylene Land, NP   BP 122/71 mmHg  Pulse 73  Resp 18  SpO2 98%  LMP 07/07/2015 (Exact Date) Physical Exam  Constitutional: She is oriented to person, place, and time. She appears well-developed and well-nourished.  HENT:  Head: Normocephalic.  MM slightly dry   Eyes: Conjunctivae are normal. Pupils are equal, round, and  reactive to light.  Neck: Normal range of motion. Neck supple.  Cardiovascular: Normal rate, regular rhythm and normal heart sounds.   Pulmonary/Chest: Effort normal and breath sounds normal. No respiratory distress. She has no wheezes. She has no rales.  Abdominal: Soft. Bowel sounds are normal. She exhibits no distension. There is no tenderness.  Gravid uterus, nontender. No CVAT   Musculoskeletal: Normal range of motion.  Neurological: She is alert and oriented to person, place, and time. No cranial nerve deficit. Coordination normal.  Skin: Skin is warm and dry.  Psychiatric: She has a normal mood and affect. Her behavior is normal. Judgment and thought content normal.  Nursing note and vitals reviewed.   ED Course  Procedures (including critical care time)  EMERGENCY DEPARTMENT [place] PREGNANCY "Study: Limited Ultrasound of the Pelvis"  INDICATIONS:Pregnancy(required) Multiple views of the uterus and pelvic cavity are obtained with a multi-frequency probe.  APPROACH:Transabdominal   PERFORMED BY: Myself  IMAGES ARCHIVED?: Yes  LIMITATIONS: Body habitus  PREGNANCY FREE FLUID: None  PREGNANCY UTERUS FINDINGS:Uterus enlarged and Gestational sac noted ADNEXAL FINDINGS:Left ovary not seen and Right ovary not seen  PREGNANCY FINDINGS: Intrauterine gestational sac noted, Yolk sac noted, Fetal pole present and Fetal heart activity seen  INTERPRETATION: Viable intrauterine pregnancy  GESTATIONAL AGE, ESTIMATE: 17 weeks  FETAL HEART RATE: 167  COMMENT(Estimate of Gestational Age):  Normal IUP      Labs Review Labs Reviewed  BASIC METABOLIC PANEL - Abnormal; Notable for the following:    Potassium 3.4 (*)    Glucose, Bld 103 (*)    All other components within normal limits  URINALYSIS COMPLETEWITH MICROSCOPIC ([facility] ONLY) - Abnormal; Notable for the following:    Color, Urine STRAW (*)    APPearance CLEAR (*)    Leukocytes, UA 2+ (*)    Bacteria, UA RARE (*)     Squamous Epithelial / LPF 0-5 (*)    All other components within normal limits  URINE CULTURE  CBC  CBG MONITORING, ED    Imaging Review No results found. I have personally reviewed and evaluated these images and lab results as part of my medical decision-making.   EKG Interpretation None       ED ECG REPORT I, Tressa Maldonado, the attending physician, personally viewed and interpreted this ECG.   Date: 03/05/2016  EKG Time: 10:04 am  Rate: 72  Rhythm: normal EKG, normal sinus rhythm  Axis: normal  Intervals:right bundle branch block  ST&T Change: nonspecific, unchanged 11/06/14    MDM   Final diagnoses:  None    Alexandria Orozco is a 30 y.o. female here with dizziness, lightheadedness. Bedside US confirmed IUP. Has no vaginal bleeding or vomiting. Will check orthostatics, hydrate and reassess.   2:14 PM Patient borderline orthostatic initially, given IVF. UA  2+ leuks with some bacteria. Recent finished zpack for sinusitis but still has sinus congestion. WBC nl. Well appearing, doesn't appear septic. No CVA tenderness to suggest pyelo. Will dc home with keflex.     Wandra Arthurs, MD 03/05/16 1415

## 2016-03-05 NOTE — Discharge Instructions (Signed)
Take keflex three times daily for a week.   Stay hydrated.   See your OB doctor.   Return to ER if you have worse abdominal pain, vomiting, fever, vaginal bleeding

## 2016-03-05 NOTE — ED Notes (Signed)
Pt to ed with c/o dizziness and lightheadedness that started yesterday.  Pt reports she is approximately [redacted] weeks pregnant.  Pt states "I feel like I am going to pass out, so my dr said I should come and be checked out"

## 2016-03-07 LAB — URINE CULTURE: Culture: 50000 — AB

## 2016-04-10 ENCOUNTER — Observation Stay
Admission: EM | Admit: 2016-04-10 | Discharge: 2016-04-10 | Disposition: A | Discharge status: Home / Self Care | Payer: 59 | Attending: Obstetrics and Gynecology | Admitting: Obstetrics and Gynecology

## 2016-04-10 ENCOUNTER — Observation Stay
Admission: EM | Admit: 2016-04-10 | Discharge: 2016-04-11 | Disposition: A | Discharge status: Home / Self Care | Payer: 59 | Source: Home / Self Care | Admitting: Obstetrics and Gynecology

## 2016-04-10 DIAGNOSIS — O99412 Diseases of the circulatory system complicating pregnancy, second trimester: Principal | ICD-10-CM | POA: Insufficient documentation

## 2016-04-10 DIAGNOSIS — Z880 Allergy status to penicillin: Secondary | ICD-10-CM | POA: Diagnosis not present

## 2016-04-10 DIAGNOSIS — R55 Syncope and collapse: Secondary | ICD-10-CM | POA: Diagnosis not present

## 2016-04-10 DIAGNOSIS — Z885 Allergy status to narcotic agent status: Secondary | ICD-10-CM | POA: Insufficient documentation

## 2016-04-10 DIAGNOSIS — Z3A22 22 weeks gestation of pregnancy: Secondary | ICD-10-CM | POA: Diagnosis not present

## 2016-04-10 DIAGNOSIS — Z882 Allergy status to sulfonamides status: Secondary | ICD-10-CM | POA: Diagnosis not present

## 2016-04-10 DIAGNOSIS — O9989 Other specified diseases and conditions complicating pregnancy, childbirth and the puerperium: Secondary | ICD-10-CM | POA: Diagnosis present

## 2016-04-10 DIAGNOSIS — R109 Unspecified abdominal pain: Secondary | ICD-10-CM

## 2016-04-10 DIAGNOSIS — O26899 Other specified pregnancy related conditions, unspecified trimester: Secondary | ICD-10-CM

## 2016-04-10 LAB — GLUCOSE, CAPILLARY: GLUCOSE-CAPILLARY: 87 mg/dL (ref 65–99)

## 2016-04-10 NOTE — Discharge Summary (Signed)
Obstetric History and Physical  Alexandria Orozco is a 30 y.o. G2P0101 with Estimated Date of Delivery: 08/13/16  who presents at [redacted]w[redacted]d  presenting for passing out x 2 today, once while standing and once while sitting. Reports h/o dizzy episodes earlier in pregnancy and episodes of rapid heartrate. Pt ate poptart, chocolate milk and lemonade plus a large glass or water this am. Patient states she has been having no contractions, no vaginal bleeding, intact membranes.     Prenatal Course Source of Care: [facility]    Pregnancy complications or risks: Patient Active Problem List   Diagnosis Date Noted  . Syncope and collapse 04/10/2016      Prenatal Transfer Tool   Past Medical History:  Diagnosis Date  . Migraines     Past Surgical History:  Procedure Laterality Date  . CHOLECYSTECTOMY    . TONSILLECTOMY      OB History  Gravida Para Term Preterm AB Living  2 1   1   1   SAB TAB Ectopic Multiple Live Births          1    # Outcome Date GA Lbr Len/2nd Weight Sex Delivery Anes PTL Lv  2 Current           1 Preterm 07/10/07    M Vag-Spont  Y LIV      Social History   Social History  . Marital status: Married    Spouse name: N/A  . Number of children: N/A  . Years of education: N/A   Social History Main Topics  . Smoking status: Never Smoker  . Smokeless tobacco: Never Used  . Alcohol use No  . Drug use: No  . Sexual activity: Yes   Other Topics Concern  . None   Social History Narrative  . None    History reviewed. No pertinent family history.  Prescriptions Prior to Admission  Medication Sig Dispense Refill Last Dose  . butalbital-acetaminophen-caffeine (FIORICET, ESGIC) 50-325-40 MG tablet Take 2 tablets by mouth every 4 (four) hours as needed for headache.   Past Week at Unknown time  . Prenatal Vit-Fe Fumarate-FA (PRENATAL MULTIVITAMIN) TABS tablet Take 1 tablet by mouth daily at 12 noon.       Allergies  Allergen Reactions  . Morphine And Related  Itching  . Penicillins Rash  . Sulfa Antibiotics Rash    Review of Systems: Negative except for what is mentioned in HPI.  Physical Exam: Vitals:   04/10/16 1227 04/10/16 1247 04/10/16 1302 04/10/16 1307  BP: (!) 105/49 (!) 111/59 (!) 95/28 114/88  Pulse: 88 80 (!) 142 65  *values from 1302 likely inaccurate, pt states cuff was on her elbow  BP 114/88   Pulse 65   LMP 11/07/2015  GENERAL: Well-developed, well-nourished female in no acute distress.  LUNGS: Clear to auscultation bilaterally.  HEART: Regular rate and rhythm. ABDOMEN: Soft, nontender, nondistended, gravid. EXTREMITIES: Nontender, no edema Cervical Exam: deferred  FHT: + FHTs     Pertinent Labs/Studies:   Results for orders placed or performed during the hospital encounter of 04/10/16 (from the past 24 hour(s))  Glucose, capillary     Status: None   Collection Time: 04/10/16 12:33 PM  Result Value Ref Range   Glucose-Capillary 87 65 - 99 mg/dL    EKG - reviewed by hospitalist Dr Vianne Bulls, normal sinus rhythm  Assessment : IUP at [redacted]w[redacted]d with syncopal episode  Plan:  Reviewed diet this am low in protein - encouraged to eat balanced  diet with protein and regular snacks. Continue po fluids.   Discharge Home

## 2016-04-10 NOTE — Discharge Summary (Signed)
Patient discharged home, discharge instructions given, patient states understanding. Patient left floor in stable condition, denies any other needs at this time. Patient to keep next scheduled OB appointment 

## 2016-04-10 NOTE — Progress Notes (Signed)
Called by labor and delivery register nurse to review the EKG. Patient came in for syncope. Patient's EKG shows normal sinus rhythm without any ST-T changes no prolongation of QT intervals. Rate 74 bpm.

## 2016-04-10 NOTE — Discharge Instructions (Signed)
Drink plenty of fluids, get plenty of rest, call your provider for any other concerns.

## 2016-04-10 NOTE — OB Triage Note (Signed)
Presents to BP for c/o dizziness and feeling lightheaded and then blacked out x2 today.  Patient denies any vaginal bleeding or leaking of fluid today, however does report spotting yesterday.  Patient reports cramping today, denies any pain with urination, urgency, or retention. Patient reports fetal movement.

## 2016-04-11 ENCOUNTER — Emergency Department
Admission: EM | Admit: 2016-04-11 | Discharge: 2016-04-11 | Disposition: A | Discharge status: Home / Self Care | Payer: 59 | Attending: Emergency Medicine | Admitting: Emergency Medicine

## 2016-04-11 ENCOUNTER — Emergency Department: Payer: 59

## 2016-04-11 DIAGNOSIS — R42 Dizziness and giddiness: Secondary | ICD-10-CM | POA: Diagnosis not present

## 2016-04-11 DIAGNOSIS — O99412 Diseases of the circulatory system complicating pregnancy, second trimester: Secondary | ICD-10-CM | POA: Diagnosis not present

## 2016-04-11 DIAGNOSIS — O26892 Other specified pregnancy related conditions, second trimester: Secondary | ICD-10-CM | POA: Insufficient documentation

## 2016-04-11 DIAGNOSIS — R55 Syncope and collapse: Secondary | ICD-10-CM

## 2016-04-11 DIAGNOSIS — R109 Unspecified abdominal pain: Secondary | ICD-10-CM

## 2016-04-11 DIAGNOSIS — O26899 Other specified pregnancy related conditions, unspecified trimester: Secondary | ICD-10-CM

## 2016-04-11 DIAGNOSIS — Z3A22 22 weeks gestation of pregnancy: Secondary | ICD-10-CM | POA: Diagnosis not present

## 2016-04-11 DIAGNOSIS — R102 Pelvic and perineal pain: Secondary | ICD-10-CM | POA: Diagnosis not present

## 2016-04-11 LAB — CBC
HCT: 38 % (ref 35.0–47.0)
Hemoglobin: 13.2 g/dL (ref 12.0–16.0)
MCH: 31.6 pg (ref 26.0–34.0)
MCHC: 34.6 g/dL (ref 32.0–36.0)
MCV: 91.3 fL (ref 80.0–100.0)
PLATELETS: 284 10*3/uL (ref 150–440)
RBC: 4.17 MIL/uL (ref 3.80–5.20)
RDW: 13.5 % (ref 11.5–14.5)
WBC: 11.8 10*3/uL — AB (ref 3.6–11.0)

## 2016-04-11 LAB — URINALYSIS COMPLETE WITH MICROSCOPIC (ARMC ONLY)
Bilirubin Urine: NEGATIVE
GLUCOSE, UA: NEGATIVE mg/dL
HGB URINE DIPSTICK: NEGATIVE
KETONES UR: NEGATIVE mg/dL
Nitrite: NEGATIVE
PH: 7 (ref 5.0–8.0)
PROTEIN: NEGATIVE mg/dL
SPECIFIC GRAVITY, URINE: 1.008 (ref 1.005–1.030)

## 2016-04-11 LAB — BASIC METABOLIC PANEL
Anion gap: 8 (ref 5–15)
BUN: 10 mg/dL (ref 6–20)
CALCIUM: 8.7 mg/dL — AB (ref 8.9–10.3)
CO2: 22 mmol/L (ref 22–32)
CREATININE: 0.5 mg/dL (ref 0.44–1.00)
Chloride: 105 mmol/L (ref 101–111)
GFR calc Af Amer: >60 mL/min (ref >60)
GLUCOSE: 93 mg/dL (ref 65–99)
Potassium: 3.5 mmol/L (ref 3.5–5.1)
SODIUM: 135 mmol/L (ref 135–145)

## 2016-04-11 LAB — TROPONIN I: Troponin I: <0.03 ng/mL (ref <0.03)

## 2016-04-11 NOTE — Discharge Instructions (Signed)
Please get plenty of water and rest.  Discharge instructions given.

## 2016-04-11 NOTE — ED Notes (Signed)
Patient transported to Ultrasound 

## 2016-04-11 NOTE — OB Triage Note (Signed)
Pt arrived to obs rm 3 with c/o lightheadedness and lower abdominal cramps lasting all afternoon.  Was having some vaginal spotting yesterday but none today.  Also having leg numbness. Pt placed on monitors and oriented to room.

## 2016-04-11 NOTE — ED Triage Notes (Signed)
Pt was seen earlier today and was up in L&D all day.  Husband reports that [place] the [place] and blacked out.  Pt was driving back to work and then had to pull over and was getting dizzy and sick all over again.  Husband reports that pt has not been acting right over the past 24 hours.  Pt reports no actual vomiting, but nausea associated with the dizziness.

## 2016-04-11 NOTE — ED Notes (Signed)
Pt back from US

## 2016-04-11 NOTE — ED Provider Notes (Signed)
[facility] Emergency Department Provider Note  ____________________________________________  Time seen: 3:30 AM  I have reviewed the triage vital signs and the nursing notes.   HISTORY  Chief Complaint Dizziness     HPI Alexandria Orozco is a 30 y.o. female [redacted] weeks pregnant presents from labor and delivery with history of 2 syncopal episodes today. Patient states first episode occurred at approximately 8 AM this morning. Patient states no preceding symptoms. She denies any chest pain no nausea no vomiting or diarrhea. Patient denies any tremulousness or palpitations. Patient states approximately 30 minutes after the event she checked her glucose level which was 83. Patient states that she ate 2 Pop tarts 5 minutes before syncopal event.    Past Medical History:  Diagnosis Date  . Migraines     Patient Active Problem List   Diagnosis Date Noted  . Abdominal pain affecting pregnancy 04/11/2016  . Syncope and collapse 04/10/2016    Past Surgical History:  Procedure Laterality Date  . CHOLECYSTECTOMY    . TONSILLECTOMY      Current Outpatient Rx  . Order #: [id number] Class: Normal  . Order #: [id number] Class: Historical Med  . Order #: [id number] Class: Historical Med    Allergies Morphine and related; Penicillins; and Sulfa antibiotics  No family history on file.  Social History Social History  Substance Use Topics  . Smoking status: Never Smoker  . Smokeless tobacco: Never Used  . Alcohol use No    Review of Systems  Constitutional: Negative for fever. Eyes: Negative for visual changes. ENT: Negative for sore throat. Cardiovascular: Negative for chest pain. Respiratory: Negative for shortness of breath. Gastrointestinal: Negative for abdominal pain, vomiting and diarrhea. Genitourinary: Negative for dysuria. Musculoskeletal: Negative for back pain. Skin: Negative for rash. Neurological: Negative for headaches, focal weakness or  numbness.Positive for syncope   10-point ROS otherwise negative.  ____________________________________________   PHYSICAL EXAM:  VITAL SIGNS: ED Triage Vitals  Enc Vitals Group     BP 04/11/16 0300 103/64     Pulse Rate 04/11/16 0300 68     Resp 04/11/16 0300 19     Temp 04/11/16 0238 98.4 F (36.9 C)     Temp Source 04/11/16 0238 Oral     SpO2 04/11/16 0300 99 %     Weight --      Height --      Head Circumference --      Peak Flow --      Pain Score 04/11/16 0239 5     Pain Loc --      Pain Edu? --      Excl. in [place]? --      Constitutional: Alert and oriented. Well appearing and in no distress. Eyes: Conjunctivae are normal. PERRL. Normal extraocular movements. ENT   Head: Normocephalic and atraumatic.   Nose: No congestion/rhinnorhea.   Mouth/Throat: Mucous membranes are moist.   Neck: No stridor. Hematological/Lymphatic/Immunilogical: No cervical lymphadenopathy. Cardiovascular: Normal rate, regular rhythm. Normal and symmetric distal pulses are present in all extremities. No murmurs, rubs, or gallops. Respiratory: Normal respiratory effort without tachypnea nor retractions. Breath sounds are clear and equal bilaterally. No wheezes/rales/rhonchi. Gastrointestinal: Soft and nontender. No distention. There is no CVA tenderness. Genitourinary: deferred Musculoskeletal: Nontender with normal range of motion in all extremities. No joint effusions.  No lower extremity tenderness nor edema. Neurologic:  Normal speech and language. No gross focal neurologic deficits are appreciated. Speech is normal.  Skin:  Skin is warm, dry and intact.  No rash noted. Psychiatric: Mood and affect are normal. Speech and behavior are normal. Patient exhibits appropriate insight and judgment.  ____________________________________________    LABS (pertinent positives/negatives)  Labs Reviewed  BASIC METABOLIC PANEL - Abnormal; Notable for the following:       Result Value    Calcium 8.7 (*)    All other components within normal limits  CBC - Abnormal; Notable for the following:    WBC 11.8 (*)    All other components within normal limits  TROPONIN I    ____________________________________________   EKG  ED ECG REPORT I, [place] N Jamelle Noy, the attending physician, personally viewed and interpreted this ECG.   Date: 04/11/2016  EKG Time:   Rate: 75  Rhythm: Normal sinus rhythm  Axis: Normal  Intervals: Normal  ST&T Change: None   ____________________________________________    RADIOLOGY  Result   CLINICAL DATA:  30 year old pregnant female presenting with pelvic pain and dizziness. Gestational age by LMP and first ultrasound is 22 weeks, 2 days. EXAM: LIMITED OBSTETRIC ULTRASOUND FINDINGS: Number of Fetuses:  Single Heart Rate:  123 bpm Movement:  Detected Presentation: Breech Placental Location: Posterior Previa: No Amniotic Fluid (Subjective):  Within normal limits. BPD:  5cm 21w  1d MATERNAL FINDINGS: Cervix:  Appears closed. Uterus/Adnexae:  No abnormality visualized. IMPRESSION: Single live intrauterine pregnancy. No acute abnormality identified. This exam is performed on an emergent basis and does not comprehensively evaluate fetal size, dating, or anatomy; follow-up complete OB US should be considered if further fetal assessment is warranted. Electronically Signed   By: Anner Crete M.D.   On: 04/11/2016 05:30      Procedures    INITIAL IMPRESSION / ASSESSMENT AND PLAN / ED COURSE  Pertinent labs & imaging results that were available during my care of the patient were reviewed by me and considered in my medical decision making (see chart for details).  No clear etiology for the patient's syncope at this time however suspect possible hypoglycemia given history obtained.  ____________________________________________   FINAL CLINICAL IMPRESSION(S) / ED DIAGNOSES  Final diagnoses:  Syncope, unspecified  syncope type      Gregor Hams, MD 04/11/16 [phone]

## 2016-04-11 NOTE — Discharge Summary (Signed)
Pt presents for c/o dizzyness and low abdominal cramping. She was evaluated 7/26 in the afternoon after passing out x 2. FSBS and EKG was normal, pt discharged home. Pt reports she is still feeling lightheaded and was giving her husband unclear answers to his questions earlier in the evening. Pt reports having cramping and spotting off & on this pregnancy. Cramping is described as constant low pain.   S: see above  O: cervix closed, +FHTs, toco: no contractions, VSS  A: IUP at [redacted]w[redacted]d, no evidence of preterm labor  P; Will send to ED for evaluation of continued lightheadedness.

## 2016-04-11 NOTE — ED Notes (Signed)
Discharge instructions reviewed with patient. Questions fielded by this RN. Patient verbalizes understanding of instructions. Patient discharged home in stable condition per Brown MD . No acute distress noted at time of discharge.   

## 2016-05-22 LAB — OB RESULTS CONSOLE HIV ANTIBODY (ROUTINE TESTING): HIV: NONREACTIVE

## 2016-05-22 LAB — OB RESULTS CONSOLE RPR: RPR: NONREACTIVE

## 2016-05-30 ENCOUNTER — Observation Stay
Admission: EM | Admit: 2016-05-30 | Discharge: 2016-05-30 | Disposition: A | Discharge status: Home / Self Care | Payer: 59 | Attending: Obstetrics & Gynecology | Admitting: Obstetrics & Gynecology

## 2016-05-30 DIAGNOSIS — Z3493 Encounter for supervision of normal pregnancy, unspecified, third trimester: Principal | ICD-10-CM | POA: Insufficient documentation

## 2016-05-30 NOTE — Discharge Summary (Signed)
Physician Final Progress Note  Patient ID: Alexandria Orozco MRN: [id number] DOB/AGE: 28-02-1986 28 y.o.  Admit date: 05/30/2016 Admitting provider: Rod Can, CNM Discharge date: 05/30/2016   Admission Diagnoses: G2P1 at [redacted]w[redacted]d with complaint of vaginal bleeding when wiping last night and with an increase in amount this morning. She states the bleeding has stopped and then was followed by clear fluid discharge. She admits to recent intercourse before the bleeding began. She admits positive fetal movement. She says that she has crampy contractions q day but nothing regular.  Discharge Diagnoses:  Active Problems:   IUP at [redacted]w[redacted]d with positive fetal heart tones- 10x10 accelerations noted. No evidence of continued bleeding or rupture of membranes  Hospital Course: Pt was admitted and placed on monitors, sterile speculum exam to rule out bleeding/ROM  Past Medical History:  Diagnosis Date  . Migraines     Past Surgical History:  Procedure Laterality Date  . CHOLECYSTECTOMY    . TONSILLECTOMY      No current facility-administered medications on file prior to encounter.    Current Outpatient Prescriptions on File Prior to Encounter  Medication Sig Dispense Refill  . benzonatate (TESSALON PERLES) 100 MG capsule Take 1 capsule (100 mg total) by mouth 3 (three) times daily as needed for cough. 15 capsule 0  . butalbital-acetaminophen-caffeine (FIORICET, ESGIC) 50-325-40 MG tablet Take 2 tablets by mouth every 4 (four) hours as needed for headache.    . Prenatal Vit-Fe Fumarate-FA (PRENATAL MULTIVITAMIN) TABS tablet Take 1 tablet by mouth daily at 12 noon.      Allergies  Allergen Reactions  . Morphine And Related Itching  . Penicillins Rash  . Sulfa Antibiotics Rash    Social History   Social History  . Marital status: Married    Spouse name: N/A  . Number of children: N/A  . Years of education: N/A   Occupational History  . Not on file.   Social History Main Topics   . Smoking status: Never Smoker  . Smokeless tobacco: Never Used  . Alcohol use No  . Drug use: No  . Sexual activity: Yes   Other Topics Concern  . Not on file   Social History Narrative  . No narrative on file    Physical Exam: Temp 98.7 F (37.1 C) (Oral)   Resp 16   LMP 11/07/2015   Gen: NAD CV: RRR Pulm: CTAB Pelvic: cervix visually closed, thin white discharge with light pink/tan color indicative of old blood on q tip Nitrazine neg, Pooling neg, Ferning neg Toco: negative Abdomen: mild to palpation, non tender Fetal Well Being: 140 bpm, moderate variability, + accelerations, - decelerations Ext: no evidence of DVT on exam  Consults: None  Significant Findings/ Diagnostic Studies: none  Procedures: Sterile speculum exam  Discharge Condition: good  Disposition: 01-Home or Self Care  Diet: Regular diet  Discharge Activity: Activity as tolerated  Discharge Instructions    Discharge activity:  No Restrictions    Complete by:  As directed    Discharge diet:  No restrictions    Complete by:  As directed    Notify physician for a general feeling that "something is not right"    Complete by:  As directed    Notify physician for increase or change in vaginal discharge    Complete by:  As directed    Notify physician for intestinal cramps, with or without diarrhea, sometimes described as "gas pain"    Complete by:  As directed  Notify physician for leaking of fluid    Complete by:  As directed    Notify physician for low, dull backache, unrelieved by heat or Tylenol    Complete by:  As directed    Notify physician for menstrual like cramps    Complete by:  As directed    Notify physician for pelvic pressure    Complete by:  As directed    Notify physician for uterine contractions.  These may be painless and feel like the uterus is tightening or the baby is  "balling up"    Complete by:  As directed    Notify physician for vaginal bleeding    Complete by:   As directed    PRETERM LABOR:  Includes any of the follwing symptoms that occur between 20 - [redacted] weeks gestation.  If these symptoms are not stopped, preterm labor can result in preterm delivery, placing your baby at risk    Complete by:  As directed    Sexual Activity:      Complete by:  As directed    Do not have intercourse if your membranes rupture or if you have cramping       Medication List    STOP taking these medications   benzonatate 100 MG capsule Commonly known as:  TESSALON PERLES     TAKE these medications   butalbital-acetaminophen-caffeine 50-325-40 MG tablet Commonly known as:  FIORICET, ESGIC Take 2 tablets by mouth every 4 (four) hours as needed for headache.   prenatal multivitamin Tabs tablet Take 1 tablet by mouth daily at 12 noon.      Follow-up Information    [facility], [place] .   Why:  go to regular scheduled prenatal appointment Contact information: [address] [place] [address] [phone]           Total time spent taking care of this patient: 20 minutes  Signed: Charlsie Merles  05/30/2016, 1:42 PM

## 2016-05-30 NOTE — Discharge Instructions (Signed)
If you have continued bleeding and/or leaking of fluid, please call your OB office or the [facility].

## 2016-05-30 NOTE — OB Triage Note (Signed)
Ms. Rajagopal here with c/o LOF, bleeding and cramping. Positive fetal movement.

## 2016-06-09 ENCOUNTER — Observation Stay
Admission: EM | Admit: 2016-06-09 | Discharge: 2016-06-10 | Disposition: A | Discharge status: Home / Self Care | Payer: 59 | Attending: Obstetrics & Gynecology | Admitting: Obstetrics & Gynecology

## 2016-06-09 DIAGNOSIS — O26893 Other specified pregnancy related conditions, third trimester: Secondary | ICD-10-CM | POA: Insufficient documentation

## 2016-06-09 DIAGNOSIS — Z880 Allergy status to penicillin: Secondary | ICD-10-CM | POA: Insufficient documentation

## 2016-06-09 DIAGNOSIS — O2343 Unspecified infection of urinary tract in pregnancy, third trimester: Secondary | ICD-10-CM | POA: Insufficient documentation

## 2016-06-09 DIAGNOSIS — Z882 Allergy status to sulfonamides status: Secondary | ICD-10-CM | POA: Insufficient documentation

## 2016-06-09 DIAGNOSIS — Z885 Allergy status to narcotic agent status: Secondary | ICD-10-CM | POA: Insufficient documentation

## 2016-06-09 DIAGNOSIS — G43909 Migraine, unspecified, not intractable, without status migrainosus: Secondary | ICD-10-CM | POA: Insufficient documentation

## 2016-06-09 DIAGNOSIS — O4703 False labor before 37 completed weeks of gestation, third trimester: Principal | ICD-10-CM | POA: Insufficient documentation

## 2016-06-09 DIAGNOSIS — Z3A30 30 weeks gestation of pregnancy: Secondary | ICD-10-CM | POA: Insufficient documentation

## 2016-06-09 DIAGNOSIS — Z9889 Other specified postprocedural states: Secondary | ICD-10-CM | POA: Insufficient documentation

## 2016-06-09 DIAGNOSIS — Z9049 Acquired absence of other specified parts of digestive tract: Secondary | ICD-10-CM | POA: Insufficient documentation

## 2016-06-10 DIAGNOSIS — O26893 Other specified pregnancy related conditions, third trimester: Secondary | ICD-10-CM | POA: Diagnosis not present

## 2016-06-10 DIAGNOSIS — Z9889 Other specified postprocedural states: Secondary | ICD-10-CM | POA: Diagnosis not present

## 2016-06-10 DIAGNOSIS — Z880 Allergy status to penicillin: Secondary | ICD-10-CM | POA: Diagnosis not present

## 2016-06-10 DIAGNOSIS — Z882 Allergy status to sulfonamides status: Secondary | ICD-10-CM | POA: Diagnosis not present

## 2016-06-10 DIAGNOSIS — O2343 Unspecified infection of urinary tract in pregnancy, third trimester: Secondary | ICD-10-CM | POA: Diagnosis not present

## 2016-06-10 DIAGNOSIS — G43909 Migraine, unspecified, not intractable, without status migrainosus: Secondary | ICD-10-CM | POA: Diagnosis not present

## 2016-06-10 DIAGNOSIS — O4703 False labor before 37 completed weeks of gestation, third trimester: Secondary | ICD-10-CM | POA: Diagnosis not present

## 2016-06-10 DIAGNOSIS — Z3A30 30 weeks gestation of pregnancy: Secondary | ICD-10-CM | POA: Diagnosis present

## 2016-06-10 DIAGNOSIS — Z885 Allergy status to narcotic agent status: Secondary | ICD-10-CM | POA: Diagnosis not present

## 2016-06-10 DIAGNOSIS — Z9049 Acquired absence of other specified parts of digestive tract: Secondary | ICD-10-CM | POA: Diagnosis not present

## 2016-06-10 LAB — URINALYSIS COMPLETE WITH MICROSCOPIC (ARMC ONLY)
Bilirubin Urine: NEGATIVE
Glucose, UA: NEGATIVE mg/dL
HGB URINE DIPSTICK: NEGATIVE
KETONES UR: NEGATIVE mg/dL
NITRITE: NEGATIVE
PH: 6 (ref 5.0–8.0)
PROTEIN: NEGATIVE mg/dL
SPECIFIC GRAVITY, URINE: 1.017 (ref 1.005–1.030)

## 2016-06-10 LAB — FETAL FIBRONECTIN: FETAL FIBRONECTIN: NEGATIVE

## 2016-06-10 MED ORDER — NITROFURANTOIN MONOHYD MACRO 100 MG PO CAPS: 100.0000 mg | ORAL_CAPSULE | Freq: Two times a day (BID) | ORAL | 0 refills | Status: DC

## 2016-06-10 NOTE — Progress Notes (Signed)
Patient given discharge instructions including preterm labor precautions and follow up information. Pt. Verbalized understanding. Patient left ambulatory in stable condition, accompanied by spouse and family member.

## 2016-06-10 NOTE — OB Triage Note (Signed)
Patient arrived to triage with c/o ctx's "all afternoon" q 5-42mins and vaginal pressure.  Denies leaking of fluid and vaginal bleeding. States positive fetal movement.  EFM initiated. Discussed POC.

## 2016-06-10 NOTE — Discharge Instructions (Signed)
Return to hospital for increase in painful contractions, leaking of fluid, vaginal bleeding, or decreased fetal movement.

## 2016-06-10 NOTE — Discharge Summary (Signed)
Physician Final Progress Note  Patient ID: Alexandria Orozco MRN: [id number] DOB/AGE: 11-21-1985 30 y.o.  Admit date: 06/09/2016 Admitting provider: Rod Can, CNM Discharge date: 06/10/2016   Admission Diagnoses: G2P0101 at [redacted]w[redacted]d with complaint of contractions all day on 9/24. Pt admits positive fetal movement. She denies LOF or VB  Discharge Diagnoses:  Active Problems: IUP with reactive NST 10x10 accelerations, not in labor Urinary tract infection   Hospital Course: Pt was admitted and placed on monitors, labs done, cervical exam.   Past Medical History:  Diagnosis Date  . Migraines     Past Surgical History:  Procedure Laterality Date  . CHOLECYSTECTOMY    . TONSILLECTOMY      No current facility-administered medications on file prior to encounter.    Current Outpatient Prescriptions on File Prior to Encounter  Medication Sig Dispense Refill  . Prenatal Vit-Fe Fumarate-FA (PRENATAL MULTIVITAMIN) TABS tablet Take 1 tablet by mouth daily at 12 noon.    . butalbital-acetaminophen-caffeine (FIORICET, ESGIC) 50-325-40 MG tablet Take 2 tablets by mouth every 4 (four) hours as needed for headache.      Allergies  Allergen Reactions  . Morphine And Related Itching  . Penicillins Rash  . Sulfa Antibiotics Rash    Social History   Social History  . Marital status: Married    Spouse name: N/A  . Number of children: N/A  . Years of education: N/A   Occupational History  . Not on file.   Social History Main Topics  . Smoking status: Never Smoker  . Smokeless tobacco: Never Used  . Alcohol use No  . Drug use: No  . Sexual activity: Yes   Other Topics Concern  . Not on file   Social History Narrative  . No narrative on file    Physical Exam: BP 114/72 (BP Location: Left Arm)   Pulse 83   Temp 98.1 F (36.7 C) (Oral)   Resp 18   Ht 5' (1.524 m)   Wt 176 lb (79.8 kg)   LMP 11/07/2015   BMI 34.37 kg/m   Gen: NAD CV: RRR Pulm: CTAB Pelvic:  FT/thick/high Toco: uterine irritability Fetal Well Being: 140 bpm, moderate variability, + accelerations, - decelerations Ext: no evidence of DVT  Consults: None  Significant Findings/ Diagnostic Studies: labs:   Results for Alexandria, Orozco (MRN [id number]) as of 06/10/2016 02:23  Ref. Range 06/10/2016 00:37  Fetal Fibronectin Latest Ref Range: NEGATIVE  NEGATIVE  Appearance Latest Ref Range: CLEAR  HAZY (A)  Bacteria, UA Latest Ref Range: NONE SEEN  RARE (A)  Bilirubin Urine Latest Ref Range: NEGATIVE  NEGATIVE  Color, Urine Latest Ref Range: YELLOW  YELLOW (A)  Glucose Latest Ref Range: NEGATIVE mg/dL NEGATIVE  Hgb urine dipstick Latest Ref Range: NEGATIVE  NEGATIVE  Ketones, ur Latest Ref Range: NEGATIVE mg/dL NEGATIVE  Leukocytes, UA Latest Ref Range: NEGATIVE  3+ (A)  Mucous Unknown PRESENT  Nitrite Latest Ref Range: NEGATIVE  NEGATIVE  pH Latest Ref Range: 5.0 - 8.0  6.0  Protein Latest Ref Range: NEGATIVE mg/dL NEGATIVE  RBC / HPF Latest Ref Range: 0 - 5 RBC/hpf 0-5  Specific Gravity, Urine Latest Ref Range: 1.005 - 1.030  1.017  Squamous Epithelial / LPF Latest Ref Range: NONE SEEN  6-30 (A)  WBC, UA Latest Ref Range: 0 - 5 WBC/hpf 0-5  URINE CULTURE Unknown Rpt    Procedures: NST  Discharge Condition: good  Disposition: 01-Home or Self Care  Diet: Regular diet  Discharge Activity: Activity as tolerated  Discharge Instructions    Discharge activity:  No Restrictions    Complete by:  As directed    Discharge diet:  No restrictions    Complete by:  As directed    No sexual activity restrictions    Complete by:  As directed    Notify physician for a general feeling that "something is not right"    Complete by:  As directed    Notify physician for increase or change in vaginal discharge    Complete by:  As directed    Notify physician for intestinal cramps, with or without diarrhea, sometimes described as "gas pain"    Complete by:  As directed    Notify  physician for leaking of fluid    Complete by:  As directed    Notify physician for low, dull backache, unrelieved by heat or Tylenol    Complete by:  As directed    Notify physician for menstrual like cramps    Complete by:  As directed    Notify physician for pelvic pressure    Complete by:  As directed    Notify physician for uterine contractions.  These may be painless and feel like the uterus is tightening or the baby is  "balling up"    Complete by:  As directed    Notify physician for vaginal bleeding    Complete by:  As directed    PRETERM LABOR:  Includes any of the follwing symptoms that occur between 20 - [redacted] weeks gestation.  If these symptoms are not stopped, preterm labor can result in preterm delivery, placing your baby at risk    Complete by:  As directed        Medication List    TAKE these medications   butalbital-acetaminophen-caffeine 50-325-40 MG tablet Commonly known as:  FIORICET, ESGIC Take 2 tablets by mouth every 4 (four) hours as needed for headache.   nitrofurantoin (macrocrystal-monohydrate) 100 MG capsule Commonly known as:  MACROBID Take 1 capsule (100 mg total) by mouth 2 (two) times daily.   prenatal multivitamin Tabs tablet Take 1 tablet by mouth daily at 12 noon.      Follow-up Information    [facility], [place] .   Why:  go to regular scheduled prenatal appointment Contact information: [address] [place] [address] [phone]           Total time spent taking care of this patient: 20 minutes  Signed: Rod Can, CNM  06/10/2016, 2:17 AM

## 2016-06-10 NOTE — Progress Notes (Signed)
Difficulty continuously monitoring FHTs due to infant size/movement. RN to bedside to adjust U/S for approx. 20 mins. Maternal position changed to left side. FHR audible for moments, continuous fetal movement palpated and audible on monitor.

## 2016-06-11 LAB — URINE CULTURE: CULTURE: NO GROWTH

## 2016-06-13 ENCOUNTER — Inpatient Hospital Stay
Admission: EM | Admit: 2016-06-13 | Discharge: 2016-06-15 | DRG: 778 | Disposition: A | Discharge status: Home / Self Care | Payer: 59 | Attending: Obstetrics & Gynecology | Admitting: Obstetrics & Gynecology

## 2016-06-13 DIAGNOSIS — O09213 Supervision of pregnancy with history of pre-term labor, third trimester: Secondary | ICD-10-CM

## 2016-06-13 DIAGNOSIS — Z6835 Body mass index (BMI) 35.0-35.9, adult: Secondary | ICD-10-CM

## 2016-06-13 DIAGNOSIS — Z3A31 31 weeks gestation of pregnancy: Secondary | ICD-10-CM

## 2016-06-13 DIAGNOSIS — O99213 Obesity complicating pregnancy, third trimester: Secondary | ICD-10-CM | POA: Diagnosis present

## 2016-06-13 DIAGNOSIS — E669 Obesity, unspecified: Secondary | ICD-10-CM | POA: Diagnosis present

## 2016-06-13 DIAGNOSIS — Z8751 Personal history of pre-term labor: Secondary | ICD-10-CM | POA: Diagnosis present

## 2016-06-13 DIAGNOSIS — Z833 Family history of diabetes mellitus: Secondary | ICD-10-CM

## 2016-06-13 MED ORDER — TERBUTALINE SULFATE 1 MG/ML IJ SOLN
0.2500 mg | INTRAMUSCULAR | Status: AC
  Administered 2016-06-13: 0.25 mg via SUBCUTANEOUS
  Filled 2016-06-13: qty 1

## 2016-06-13 MED ORDER — LACTATED RINGERS IV BOLUS (SEPSIS)
1000.0000 mL | Freq: Once | INTRAVENOUS | Status: AC
  Administered 2016-06-13: 1000 mL via INTRAVENOUS

## 2016-06-13 NOTE — Progress Notes (Deleted)
This note also relates to the following rows which could not be included: SpO2 - Cannot attach notes to unvalidated device data  Difficulty distinguishing between maternal and fetal heart rate. Active fetal movement. RN to bedside.

## 2016-06-13 NOTE — OB Triage Note (Signed)
Pt. Arrived to triage with c/o lower back pain since last night throughout the entire day. Pain is constant, but feels occasional tightness through abdomen. Reports good fetal movement. Denies leaking of fluid or vaginal bleeding.  EFM applied. Discussed plan of care. Patient verbalized understanding.

## 2016-06-13 NOTE — Progress Notes (Signed)
Difficulty distinguishing between maternal and fetal heart rate at times.  Very active fetal movement. RN to bedside to adjust U/S and toco.

## 2016-06-13 NOTE — Progress Notes (Signed)
Difficulty continuously monitoring fetal heart tones due to fetal size and movement.

## 2016-06-14 ENCOUNTER — Encounter: Payer: Self-pay | Admitting: Obstetrics and Gynecology

## 2016-06-14 DIAGNOSIS — O99213 Obesity complicating pregnancy, third trimester: Secondary | ICD-10-CM | POA: Diagnosis present

## 2016-06-14 DIAGNOSIS — Z3A31 31 weeks gestation of pregnancy: Secondary | ICD-10-CM | POA: Diagnosis not present

## 2016-06-14 DIAGNOSIS — Z833 Family history of diabetes mellitus: Secondary | ICD-10-CM | POA: Diagnosis not present

## 2016-06-14 DIAGNOSIS — O09213 Supervision of pregnancy with history of pre-term labor, third trimester: Secondary | ICD-10-CM | POA: Diagnosis not present

## 2016-06-14 DIAGNOSIS — E669 Obesity, unspecified: Secondary | ICD-10-CM | POA: Diagnosis present

## 2016-06-14 DIAGNOSIS — Z6835 Body mass index (BMI) 35.0-35.9, adult: Secondary | ICD-10-CM | POA: Diagnosis not present

## 2016-06-14 DIAGNOSIS — Z8751 Personal history of pre-term labor: Secondary | ICD-10-CM | POA: Diagnosis present

## 2016-06-14 LAB — TYPE AND SCREEN
ABO/RH(D): O POS
Antibody Screen: NEGATIVE

## 2016-06-14 LAB — CBC
HCT: 36.2 % (ref 35.0–47.0)
HEMOGLOBIN: 12.9 g/dL (ref 12.0–16.0)
MCH: 32.7 pg (ref 26.0–34.0)
MCHC: 35.7 g/dL (ref 32.0–36.0)
MCV: 91.6 fL (ref 80.0–100.0)
PLATELETS: 312 10*3/uL (ref 150–440)
RBC: 3.95 MIL/uL (ref 3.80–5.20)
RDW: 12.9 % (ref 11.5–14.5)
WBC: 12.8 10*3/uL — AB (ref 3.6–11.0)

## 2016-06-14 MED ORDER — ONDANSETRON HCL 4 MG/2ML IJ SOLN: 4.0000 mg | Freq: Four times a day (QID) | INTRAMUSCULAR | Status: DC | PRN

## 2016-06-14 MED ORDER — OXYTOCIN BOLUS FROM INFUSION: 500.0000 mL | Freq: Once | INTRAVENOUS | Status: DC

## 2016-06-14 MED ORDER — ACETAMINOPHEN 325 MG PO TABS
650.0000 mg | ORAL_TABLET | Freq: Four times a day (QID) | ORAL | Status: DC | PRN
  Administered 2016-06-14 (×2): 650 mg via ORAL
  Filled 2016-06-14 (×2): qty 2

## 2016-06-14 MED ORDER — BUTALBITAL-APAP-CAFFEINE 50-325-40 MG PO TABS
1.0000 | ORAL_TABLET | ORAL | Status: DC | PRN
  Administered 2016-06-14 – 2016-06-15 (×2): 1 via ORAL
  Filled 2016-06-14: qty 1

## 2016-06-14 MED ORDER — OXYTOCIN 10 UNIT/ML IJ SOLN: 10.0000 [IU] | Freq: Once | INTRAMUSCULAR | Status: DC

## 2016-06-14 MED ORDER — OXYTOCIN 40 UNITS IN LACTATED RINGERS INFUSION - SIMPLE MED: 2.5000 [IU]/h | INTRAVENOUS | Status: DC

## 2016-06-14 MED ORDER — FAMOTIDINE 20 MG PO TABS
20.0000 mg | ORAL_TABLET | Freq: Two times a day (BID) | ORAL | Status: DC
  Administered 2016-06-14 – 2016-06-15 (×3): 20 mg via ORAL
  Filled 2016-06-14 (×3): qty 1

## 2016-06-14 MED ORDER — MAGNESIUM SULFATE 50 % IJ SOLN
2.0000 g/h | INTRAVENOUS | Status: AC
  Administered 2016-06-14 – 2016-06-15 (×2): 2 g/h via INTRAVENOUS
  Filled 2016-06-14 (×2): qty 80

## 2016-06-14 MED ORDER — CEFAZOLIN IN D5W 1 GM/50ML IV SOLN
1.0000 g | Freq: Three times a day (TID) | INTRAVENOUS | Status: DC
  Administered 2016-06-14 – 2016-06-15 (×3): 1 g via INTRAVENOUS
  Filled 2016-06-14 (×3): qty 50

## 2016-06-14 MED ORDER — CEFAZOLIN SODIUM-DEXTROSE 2-4 GM/100ML-% IV SOLN
2.0000 g | Freq: Once | INTRAVENOUS | Status: AC
  Administered 2016-06-14: 2 g via INTRAVENOUS
  Filled 2016-06-14: qty 100

## 2016-06-14 MED ORDER — LIDOCAINE HCL (PF) 1 % IJ SOLN: 30.0000 mL | INTRAMUSCULAR | Status: DC | PRN

## 2016-06-14 MED ORDER — LACTATED RINGERS IV SOLN
INTRAVENOUS | Status: DC
  Administered 2016-06-14: via INTRAVENOUS

## 2016-06-14 MED ORDER — HYDROMORPHONE HCL 1 MG/ML IJ SOLN
1.0000 mg | INTRAMUSCULAR | Status: DC | PRN
  Administered 2016-06-14 (×2): 1 mg via INTRAVENOUS
  Filled 2016-06-14 (×2): qty 1

## 2016-06-14 MED ORDER — ZOLPIDEM TARTRATE 5 MG PO TABS
5.0000 mg | ORAL_TABLET | Freq: Every evening | ORAL | Status: DC | PRN
  Administered 2016-06-14: 5 mg via ORAL
  Filled 2016-06-14: qty 1

## 2016-06-14 MED ORDER — SOD CITRATE-CITRIC ACID 500-334 MG/5ML PO SOLN: 30.0000 mL | ORAL | Status: DC | PRN

## 2016-06-14 MED ORDER — LACTATED RINGERS IV SOLN
INTRAVENOUS | Status: DC
  Administered 2016-06-14 – 2016-06-15 (×3): via INTRAVENOUS

## 2016-06-14 MED ORDER — LACTATED RINGERS IV SOLN: 500.0000 mL | INTRAVENOUS | Status: DC | PRN

## 2016-06-14 MED ORDER — BUTALBITAL-APAP-CAFFEINE 50-325-40 MG PO TABS
ORAL_TABLET | ORAL | Status: AC
  Administered 2016-06-14: 1 via ORAL
  Filled 2016-06-14: qty 1

## 2016-06-14 MED ORDER — MAGNESIUM SULFATE BOLUS VIA INFUSION
4.0000 g | Freq: Once | INTRAVENOUS | Status: DC
  Filled 2016-06-14: qty 500

## 2016-06-14 MED ORDER — BETAMETHASONE SOD PHOS & ACET 6 (3-3) MG/ML IJ SUSP
12.0000 mg | Freq: Every day | INTRAMUSCULAR | Status: AC
  Administered 2016-06-14 – 2016-06-15 (×2): 12 mg via INTRAMUSCULAR
  Filled 2016-06-14: qty 2

## 2016-06-14 MED ORDER — MAGNESIUM SULFATE 4 GM/100ML IV SOLN
INTRAVENOUS | Status: AC
  Administered 2016-06-14: 4 g
  Filled 2016-06-14: qty 100

## 2016-06-14 MED ORDER — BUTORPHANOL TARTRATE 1 MG/ML IJ SOLN
2.0000 mg | INTRAMUSCULAR | Status: DC | PRN
Start: 2016-06-14 — End: 2016-06-15

## 2016-06-14 MED ORDER — HYDROMORPHONE HCL 1 MG/ML IJ SOLN
0.5000 mg | Freq: Once | INTRAMUSCULAR | Status: AC
  Administered 2016-06-14: 0.5 mg via INTRAVENOUS
  Filled 2016-06-14: qty 1

## 2016-06-14 NOTE — Progress Notes (Signed)
Pt resting, describes some lower suprapubic pain, no further back pain now.    No CP, SOB, h/a.    Voids without difficulty    No VB or LOF AF, VSS    Abd ND, gravid, NT    SVE deferred    DTRs 1+    Extr no edema    A NST procedure involving FHR monitoring reveals a normal baseline established, appropriate time of 20-40 minutes of evaluation, and accels >2 seen w 10x10 characteristics.  Results show a REACTIVE NST for 31 weeks.   A/P: PTL at 31 3/[redacted] weeks EGA    BMZ 0600, second dose in am    MgSO4 started, tolerated so far    Analgesia, Sleep agents prn    Monitor for s/sx worsening PTL; counseled as to risks of labor and delivery at this point; desire for steroids and Magnesium neuroprotection (as well as toco effect); no clear etiology of her PTL (other than prior h/o PTD 36 weeks)    ABX; GBS sent    Patient has been counseled as to the risks of prematurity, including risks of respiratory depression or distress, jaundice, feeding or temperature regulation problems, neurologic concerns including hearing or visual problems, and brain complications. Patient understands the risks of tocolytic therapies along with their inherent failure rates, and the reasons for and against their use in individual circumstances.   Barnett Applebaum, MD [phone]

## 2016-06-14 NOTE — H&P (Signed)
OB History & Physical   History of Present Illness:  Chief Complaint: contractions  HPI:  Alexandria Orozco is a 30 y.o. G30P0101 female at [redacted]w[redacted]d dated by LMP consistent with 8 week ultrasound.  Her pregnancy has been complicated by Obesity with BMI in mid 30s, history of preterm delivery (about 36 weeks).    She reports contractions that have gotten worse since yesterday morning.   She denies leakage of fluid.   She denies vaginal bleeding.   She reports fetal movement.    Maternal Medical History:   Past Medical History:  Diagnosis Date  . Migraines     Past Surgical History:  Procedure Laterality Date  . CHOLECYSTECTOMY    . TONSILLECTOMY      Allergies  Allergen Reactions  . Morphine And Related Itching  . Penicillins Rash  . Sulfa Antibiotics Rash    Prior to Admission medications   Medication Sig Start Date End Date Taking? Authorizing Provider  Prenatal Vit-Fe Fumarate-FA (PRENATAL MULTIVITAMIN) TABS tablet Take 1 tablet by mouth daily at 12 noon.   Yes Historical Provider, MD    OB History  Gravida Para Term Preterm AB Living  2 1   1   1   SAB TAB Ectopic Multiple Live Births          1    # Outcome Date GA Lbr Len/2nd Weight Sex Delivery Anes PTL Lv  2 Current           1 Preterm 07/10/07    M Vag-Spont  Y LIV      Prenatal care site: [facility]  Social History: She  reports that she has never smoked. She has never used smokeless tobacco. She reports that she does not drink alcohol or use drugs.  Family History: Type 2 DM in her mother  Review of Systems: Negative x 10 systems reviewed except as noted in the HPI.    Physical Exam:  Vital Signs: BP 112/66   Pulse 83   Temp 97.9 F (36.6 C) (Oral)   Resp 18   Ht 5' (1.524 m)   Wt 176 lb (79.8 kg)   LMP 11/07/2015   SpO2 98%   BMI 34.37 kg/m  General: no acute distress.  HEENT: normocephalic, atraumatic Heart: regular rate & rhythm.  No murmurs/rubs/gallops Lungs: clear to auscultation  bilaterally Abdomen: soft, gravid, non-tender Pelvic: (female chaperone present during pelvic exam)  External: Normal external female genitalia  Cervix: Dilation: 1 / Effacement (%): 50 / Station: Ballotable  (changed from closed on presentation)  ROM: - pooling; - nitrazine; - ferning Extremities: non-tender, symmetric, no edema bilaterally.  DTRs: 2+  Neurologic: Alert & oriented x 3.    Pertinent Results:  Prenatal Labs: Blood type/Rh O positive  Antibody screen negative  Rubella Immune  Varicella Immune    RPR NR  HBsAg negative  HIV negative  GC negative  Chlamydia negative  Genetic screening NIPT diploid xx  1 hour GTT Early 1h gtt: 122, 28 weeks: 112  3 hour GTT n/a  GBS unknown   Baseline FHR: 125 beats/min   Variability: moderate   Accelerations: present   Decelerations: absent Contractions: present frequency: 2-3 q 10 min Overall assessment: category 1  Assessment:  Alexandria Orozco is a 30 y.o. G17P0101 female at [redacted]w[redacted]d with Preterm Labor.   Plan:  1. Admit to Labor & Delivery  2. CBC, T&S, Clrs, IVF 3. GBS unknown.   4. Fetal well-being: reassuring 5. Preterm labor: BMTZ  x 2, magnesium sulfate for tocolysis and fetal neuroprotection   Will Bonnet, MD 06/14/2016 4:46 AM

## 2016-06-14 NOTE — Progress Notes (Signed)
This note also relates to the following rows which could not be included: SpO2 - Cannot attach notes to unvalidated device data  Difficulty continuously monitoring FHT's due to fetal size and movement and maternal movement. Difficulty accurately assessing ctx's on toco due to maternal movement. RN to bedside to adjust u/s and palpate and adjust toco.

## 2016-06-15 LAB — CULTURE, BETA STREP (GROUP B ONLY)

## 2016-06-15 LAB — MAGNESIUM: MAGNESIUM: 5.4 mg/dL — AB (ref 1.7–2.4)

## 2016-06-15 LAB — SYPHILIS: RPR W/REFLEX TO RPR TITER AND TREPONEMAL ANTIBODIES, TRADITIONAL SCREENING AND DIAGNOSIS ALGORITHM: RPR Ser Ql: NONREACTIVE

## 2016-06-15 NOTE — Discharge Instructions (Signed)
Please keep appointment scheduled for Thursday Oct.5 at [place].  If you have any questions or concerns please call the on-call physician or you may call the nurses' desk at [facility] at [phone].  If you have urgent concerns please go to the nearest Emergency Department to be assessed.

## 2016-06-15 NOTE — Discharge Summary (Signed)
Physician Discharge Summary  Patient ID: REEDA HOLLERS MRN: [id number] DOB/AGE: 18-Sep-1985 30 y.o.  Admit date: 06/13/2016 Discharge date: 06/15/2016  Admission Diagnoses:  Discharge Diagnoses:  Active Problems:   Labor and delivery, indication for care   Preterm labor   Discharged Condition: good  Hospital Course: Patient presented with preterm contraction and preterm cervical dilation of 1cm.  She had a negative FFN, was tocolysed and started on magnesium sulfate, received a course of BMZ.  She did not make any further cervical change and contractions spaced out.  Patient was discharged in stable condition on modified bedrest.  Consults: None  Significant Diagnostic Studies:  Results for orders placed or performed during the hospital encounter of 06/13/16 (from the past 24 hour(s))  Magnesium     Status: Abnormal   Collection Time: 06/14/16 11:40 PM  Result Value Ref Range   Magnesium 5.4 (H) 1.7 - 2.4 mg/dL   Discharge Exam: Blood pressure 109/64, pulse 80, temperature 98.6 F (37 C), temperature source Oral, resp. rate 14, height 5' (1.524 m), weight 176 lb (79.8 kg), last menstrual period 11/07/2015, SpO2 98 %. General appearance: alert, cooperative, appears stated age and no distress GI: normal findings: gravid uterus, soft, non-tender Extremities: extremities normal, atraumatic, no cyanosis or edema  Disposition: 01-Home or Self Care  Discharge Instructions    Discharge activity:  No Restrictions    Complete by:  As directed    Discharge diet:  No restrictions    Complete by:  As directed    Fetal Kick Count:  Lie on our left side for one hour after a meal, and count the number of times your baby kicks.  If it is less than 5 times, get up, move around and drink some juice.  Repeat the test 30 minutes later.  If it is still less than 5 kicks in an hour, notify your doctor.    Complete by:  As directed    No sexual activity restrictions    Complete by:  As  directed    Notify physician for a general feeling that "something is not right"    Complete by:  As directed    Notify physician for increase or change in vaginal discharge    Complete by:  As directed    Notify physician for intestinal cramps, with or without diarrhea, sometimes described as "gas pain"    Complete by:  As directed    Notify physician for leaking of fluid    Complete by:  As directed    Notify physician for low, dull backache, unrelieved by heat or Tylenol    Complete by:  As directed    Notify physician for menstrual like cramps    Complete by:  As directed    Notify physician for pelvic pressure    Complete by:  As directed    Notify physician for uterine contractions.  These may be painless and feel like the uterus is tightening or the baby is  "balling up"    Complete by:  As directed    Notify physician for vaginal bleeding    Complete by:  As directed    PRETERM LABOR:  Includes any of the follwing symptoms that occur between 20 - [redacted] weeks gestation.  If these symptoms are not stopped, preterm labor can result in preterm delivery, placing your baby at risk    Complete by:  As directed        Medication List    TAKE these medications  prenatal multivitamin Tabs tablet Take 1 tablet by mouth daily at 12 noon.        SignedDorthula Nettles 06/15/2016, 11:45 AM

## 2016-07-07 ENCOUNTER — Encounter: Payer: Self-pay | Admitting: *Deleted

## 2016-07-07 ENCOUNTER — Observation Stay
Admission: EM | Admit: 2016-07-07 | Discharge: 2016-07-07 | Disposition: A | Discharge status: Home / Self Care | Payer: 59 | Attending: Obstetrics and Gynecology | Admitting: Obstetrics and Gynecology

## 2016-07-07 DIAGNOSIS — Z3A00 Weeks of gestation of pregnancy not specified: Secondary | ICD-10-CM | POA: Diagnosis not present

## 2016-07-07 LAB — URINALYSIS COMPLETE WITH MICROSCOPIC (ARMC ONLY)
BILIRUBIN URINE: NEGATIVE
GLUCOSE, UA: NEGATIVE mg/dL
HGB URINE DIPSTICK: NEGATIVE
Ketones, ur: NEGATIVE mg/dL
NITRITE: NEGATIVE
PH: 6 (ref 5.0–8.0)
Protein, ur: 30 mg/dL — AB
SPECIFIC GRAVITY, URINE: 1.02 (ref 1.005–1.030)

## 2016-07-07 MED ORDER — SODIUM CHLORIDE FLUSH 0.9 % IV SOLN
INTRAVENOUS | Status: AC
  Filled 2016-07-07: qty 10

## 2016-07-07 MED ORDER — LACTATED RINGERS IV SOLN
INTRAVENOUS | Status: DC
  Administered 2016-07-07: 16:00:00 via INTRAVENOUS

## 2016-07-07 MED ORDER — ACETAMINOPHEN 325 MG PO TABS: 650.0000 mg | ORAL_TABLET | ORAL | Status: DC | PRN

## 2016-07-07 MED ORDER — CALCIUM CARBONATE ANTACID 500 MG PO CHEW: 2.0000 | CHEWABLE_TABLET | ORAL | Status: DC | PRN

## 2016-07-07 MED ORDER — TERBUTALINE SULFATE 1 MG/ML IJ SOLN
0.2500 mg | Freq: Once | INTRAMUSCULAR | Status: DC | PRN
  Filled 2016-07-07: qty 1

## 2016-07-07 MED ORDER — BUTORPHANOL TARTRATE 1 MG/ML IJ SOLN: 1.0000 mg | Freq: Once | INTRAMUSCULAR | Status: DC

## 2016-07-07 MED ORDER — ZOLPIDEM TARTRATE 5 MG PO TABS: 5.0000 mg | ORAL_TABLET | Freq: Every evening | ORAL | 1 refills | Status: DC | PRN

## 2016-07-07 NOTE — OB Triage Note (Signed)
Pt complains of contractions that started Saturday through today with increasing intensity.She reports discharge that varies between pink and red in color.  She reports nausea but no vomiting.  States that she has been eating and drinking okay.

## 2016-07-07 NOTE — Final Progress Note (Signed)
Physician Final Progress Note  Patient ID: Alexandria Orozco MRN: [id number] DOB/AGE: 01/04/1986 30 y.o.  Admit date: 07/07/2016 Admitting provider: Malachy Mood, MD Discharge date: 07/07/2016   Admission Diagnoses: Preterm contractions  Discharge Diagnoses:  Preterm contractions  Consults: None  Significant Findings/ Diagnostic Studies:  Results for orders placed or performed during the hospital encounter of 07/07/16 (from the past 24 hour(s))  Urinalysis complete, with microscopic ([facility] only)     Status: Abnormal   Collection Time: 07/07/16  3:31 PM  Result Value Ref Range   Color, Urine YELLOW (A) YELLOW   APPearance CLOUDY (A) CLEAR   Glucose, UA NEGATIVE NEGATIVE mg/dL   Bilirubin Urine NEGATIVE NEGATIVE   Ketones, ur NEGATIVE NEGATIVE mg/dL   Specific Gravity, Urine 1.020 1.005 - 1.030   Hgb urine dipstick NEGATIVE NEGATIVE   pH 6.0 5.0 - 8.0   Protein, ur 30 (A) NEGATIVE mg/dL   Nitrite NEGATIVE NEGATIVE   Leukocytes, UA 3+ (A) NEGATIVE   RBC / HPF 0-5 0 - 5 RBC/hpf   WBC, UA 6-30 0 - 5 WBC/hpf   Bacteria, UA MANY (A) NONE SEEN   Squamous Epithelial / LPF 6-30 (A) NONE SEEN   Mucous PRESENT    Procedures: NST category I tracing and reactive  Discharge Condition: stable no cervical change over 4-hrs  Disposition: 01-Home or Self Care  Diet: general  Discharge Activity: no restrictions  Discharge Instructions    Discharge activity:  No Restrictions    Complete by:  As directed    Discharge diet:  No restrictions    Complete by:  As directed    Fetal Kick Count:  Lie on our left side for one hour after a meal, and count the number of times your baby kicks.  If it is less than 5 times, get up, move around and drink some juice.  Repeat the test 30 minutes later.  If it is still less than 5 kicks in an hour, notify your doctor.    Complete by:  As directed    LABOR:  When conractions begin, you should start to time them from the beginning of one  contraction to the beginning  of the next.  When contractions are 5 - 10 minutes apart or less and have been regular for at least an hour, you should call your health care provider.    Complete by:  As directed    No sexual activity restrictions    Complete by:  As directed    Notify physician for bleeding from the vagina    Complete by:  As directed    Notify physician for blurring of vision or spots before the eyes    Complete by:  As directed    Notify physician for chills or fever    Complete by:  As directed    Notify physician for fainting spells, "black outs" or loss of consciousness    Complete by:  As directed    Notify physician for increase in vaginal discharge    Complete by:  As directed    Notify physician for leaking of fluid    Complete by:  As directed    Notify physician for pain or burning when urinating    Complete by:  As directed    Notify physician for pelvic pressure (sudden increase)    Complete by:  As directed    Notify physician for severe or continued nausea or vomiting    Complete by:  As directed  Notify physician for sudden gushing of fluid from the vagina (with or without continued leaking)    Complete by:  As directed    Notify physician for sudden, constant, or occasional abdominal pain    Complete by:  As directed    Notify physician if baby moving less than usual    Complete by:  As directed        Medication List    TAKE these medications   prenatal multivitamin Tabs tablet Take 1 tablet by mouth daily at 12 noon. Notes to patient:  Take prenatal vitamins as recommended during pregnancy   zolpidem 5 MG tablet Commonly known as:  AMBIEN Take 1 tablet (5 mg total) by mouth at bedtime as needed for sleep.        Total time spent taking care of this patient: 15 minutes  Signed: Dorthula Nettles 07/07/2016, 7:35 PM

## 2016-07-07 NOTE — Progress Notes (Signed)
Dr Georgianne Fick in room to reassess pt. SVE performed, cervix unchanged from prior assessment, speaking with pt and s/o about exam and findings, experiencing preterm contractions but not preterm labor. Discussed with to monitor for and report or return to hospital if any changes in intensity. Next OB appt scheduled for Nov 1. Dr Georgianne Fick encouraged pt to call for f/u this week if she feels a need to be evaluated sooner than scheduled appt given her history. Spoke with pt about options for pain mgmt to allow for rest, Ambien. Questions addressed. Pt and s/o verbalized understanding.

## 2016-07-07 NOTE — Discharge Summary (Signed)
See final progress note. 

## 2016-07-07 NOTE — OB Triage Note (Signed)
FHT remains reactive, category 1 with occasional contractions, palpate mild-mod, pt c/o she still has same abdominal pain, has not improved much even after IVF. Rates pain 1/10 when not having contractions and 8/10 during contractions. VSS, PIV and EFM d'ced. Discharge instructions and teaching completed with pt and s/o, discharge paperwork including handouts related to preterm labor precautions provided to pt, encouraged pt to rest, stay well hydrated and f/u with primary OB next week as recommended per MD. Pt is aware she may return to hospital with any worsening symptoms. Pt reports active fetal movement prior to discharge.

## 2016-07-17 LAB — OB RESULTS CONSOLE GBS: GBS: POSITIVE

## 2016-07-19 ENCOUNTER — Encounter: Payer: Self-pay | Admitting: *Deleted

## 2016-07-19 ENCOUNTER — Observation Stay
Admission: EM | Admit: 2016-07-19 | Discharge: 2016-07-19 | Disposition: A | Discharge status: Home / Self Care | Payer: 59 | Attending: Obstetrics & Gynecology | Admitting: Obstetrics & Gynecology

## 2016-07-19 DIAGNOSIS — O26893 Other specified pregnancy related conditions, third trimester: Secondary | ICD-10-CM | POA: Diagnosis not present

## 2016-07-19 DIAGNOSIS — R109 Unspecified abdominal pain: Secondary | ICD-10-CM | POA: Insufficient documentation

## 2016-07-19 DIAGNOSIS — M545 Low back pain: Secondary | ICD-10-CM | POA: Diagnosis present

## 2016-07-19 DIAGNOSIS — O26899 Other specified pregnancy related conditions, unspecified trimester: Secondary | ICD-10-CM | POA: Diagnosis present

## 2016-07-19 MED ORDER — NIFEDIPINE 10 MG PO CAPS: 10.0000 mg | ORAL_CAPSULE | Freq: Three times a day (TID) | ORAL | 1 refills | Status: DC | PRN

## 2016-07-19 MED ORDER — NIFEDIPINE 10 MG PO CAPS
10.0000 mg | ORAL_CAPSULE | Freq: Three times a day (TID) | ORAL | Status: DC
  Administered 2016-07-19: 10 mg via ORAL
  Filled 2016-07-19: qty 1

## 2016-07-19 MED ORDER — ACETAMINOPHEN 325 MG PO TABS: 650.0000 mg | ORAL_TABLET | ORAL | Status: DC | PRN

## 2016-07-19 MED ORDER — ONDANSETRON HCL 4 MG/2ML IJ SOLN: 4.0000 mg | Freq: Four times a day (QID) | INTRAMUSCULAR | Status: DC | PRN

## 2016-07-19 NOTE — Discharge Summary (Signed)
Physician Discharge Summary  Patient ID: Alexandria Orozco MRN: [id number] DOB/AGE: 28/01/1986 28 y.o.  Admit date: 07/19/2016 Discharge date: 07/19/2016  Admission Diagnoses:  Discharge Diagnoses:  Active Problems:   Abdominal pain affecting pregnancy  Discharged Condition: good  Hospital Course: Monitored for sx's of labor; no cervical change. Procardia as an option discussed, one dose given and tolerated.  Consults: None  Significant Diagnostic Studies: A NST procedure was performed with FHR monitoring and a normal baseline established, appropriate time of 20-40 minutes of evaluation, and accels >2 seen w 15x15 characteristics.  Results show a REACTIVE NST.   Treatments: Fluids, rest, Procardia.  Discharge Exam: Blood pressure 110/84, pulse 99, temperature 98.1 F (36.7 C), temperature source Oral, resp. rate 18, height 5' (1.524 m), weight 178 lb (80.7 kg), last menstrual period 11/07/2015. no change in cervical exam (2-3/50/-3)  Disposition: 01-Home or Self Care     Medication List    TAKE these medications   NIFEdipine 10 MG capsule Commonly known as:  PROCARDIA Take 1 capsule (10 mg total) by mouth every 8 (eight) hours as needed.   prenatal multivitamin Tabs tablet Take 1 tablet by mouth daily at 12 noon.   zolpidem 5 MG tablet Commonly known as:  AMBIEN Take 1 tablet (5 mg total) by mouth at bedtime as needed for sleep.        Signed: Hoyt Koch 07/19/2016, 1:42 PM

## 2016-07-19 NOTE — Progress Notes (Signed)
Discharge reviewed with patient including follow up appointments, activity limitations, prescriptions and when to seek medical attention. All questions answered. Patient discharged home, ambulatory with steady gait, no signs of distress observed at time of discharge.

## 2016-07-19 NOTE — H&P (Signed)
Obstetrics Admission History & Physical   Contractions   HPI:  30 y.o. [id number] @ [redacted]w[redacted]d (08/13/2016, Date entered prior to episode creation). Admitted on 07/19/2016:   Patient Active Problem List   Diagnosis Date Noted  . Preterm labor 06/14/2016  . Labor and delivery, indication for care 06/13/2016  . Indication for care in labor and delivery, antepartum 05/30/2016  . Abdominal pain affecting pregnancy 04/11/2016  . Syncope and collapse 04/10/2016     Presents for pain in lower abd and back in addition to nausea.  Has been having ctxs for weeks, worse today, still intermittant.  No radiation.  Modifier- rest helps some.  Good FM.  No ROM or VB.  Prior tx fior PTL w Mag, BMZ.  [facility] at: at [facility]  PMHx:  Past Medical History:  Diagnosis Date  . Migraines    PSHx:  Past Surgical History:  Procedure Laterality Date  . CHOLECYSTECTOMY    . TONSILLECTOMY     Medications:  Prescriptions Prior to Admission  Medication Sig Dispense Refill Last Dose  . Prenatal Vit-Fe Fumarate-FA (PRENATAL MULTIVITAMIN) TABS tablet Take 1 tablet by mouth daily at 12 noon.   07/18/2016  . zolpidem (AMBIEN) 5 MG tablet Take 1 tablet (5 mg total) by mouth at bedtime as needed for sleep. 30 tablet 1 Unknown at Unknown time   Allergies: is allergic to morphine and related; penicillins; and sulfa antibiotics. OBHx:  OB History  Gravida Para Term Preterm AB Living  2 1   1   1   SAB TAB Ectopic Multiple Live Births          1    # Outcome Date GA Lbr Len/2nd Weight Sex Delivery Anes PTL Lv  2 Current           1 Preterm 07/10/07    M Vag-Spont  Y LIV     [id number] than listed in HPI remarkable for: no GYN cancers Soc Hx: Alcohol: none and Recreational drug use: none  Objective:   Vitals:   07/19/16 1206  BP: 110/84  Pulse: 99  Resp: 18  Temp: 98.1 F (36.7 C)   General: Well nourished, well developed female in no acute distress.  Skin: Warm and dry.  Cardiovascular:Regular rate  and rhythm. Respiratory: Clear to auscultation bilateral. Normal respiratory effort Abdomen: mild Neuro/Psych: Normal mood and affect.   Pelvic exam: is not limited by body habitus EGBUS: within normal limits Vagina: within normal limits and with normal mucosa blood in the vault Cervix: 2-3/50/high Uterus: Uterus demonstrates irritability pattern.  Adnexa: normal adnexa  EFM:FHR: 140 bpm, variability: moderate,  accelerations:  Present,  decelerations:  Absent Toco: ctxs/uterine activity q 3-7 min   Perinatal info:  Blood type: O positive Rubella- Immune Varicella -Immune TDaP Given during third trimester of this pregnancy RPR NR / HIV Neg/ HBsAg Neg   Assessment & Plan:   30 y.o. G2P0101 @ 106w3d, Admitted on 07/19/2016:  ABDOMINAL PAIN in PREGNANCY, LOW BACK PAIN, NAUSEA No S/SX PTL. Fluids, Rest.  Consider work reduction.   Procardia discussed as option for sx control (will not prevent PTL).  SE discussed.    Observe for cervical change and Fetal Wellbeing Reassuring

## 2016-08-07 ENCOUNTER — Inpatient Hospital Stay
Admission: EM | Admit: 2016-08-07 | Discharge: 2016-08-09 | DRG: 774 | Disposition: A | Discharge status: Home / Self Care | Payer: 59 | Attending: Obstetrics and Gynecology | Admitting: Obstetrics and Gynecology

## 2016-08-07 ENCOUNTER — Encounter: Payer: Self-pay | Admitting: *Deleted

## 2016-08-07 DIAGNOSIS — Z3493 Encounter for supervision of normal pregnancy, unspecified, third trimester: Secondary | ICD-10-CM | POA: Diagnosis present

## 2016-08-07 DIAGNOSIS — Z3A39 39 weeks gestation of pregnancy: Secondary | ICD-10-CM | POA: Diagnosis not present

## 2016-08-07 LAB — CBC
HEMATOCRIT: 34.9 % — AB (ref 35.0–47.0)
HEMOGLOBIN: 12.2 g/dL (ref 12.0–16.0)
MCH: 31.6 pg (ref 26.0–34.0)
MCHC: 35.1 g/dL (ref 32.0–36.0)
MCV: 90.1 fL (ref 80.0–100.0)
Platelets: 261 10*3/uL (ref 150–440)
RBC: 3.87 MIL/uL (ref 3.80–5.20)
RDW: 12.9 % (ref 11.5–14.5)
WBC: 9.5 10*3/uL (ref 3.6–11.0)

## 2016-08-07 LAB — TYPE AND SCREEN
ABO/RH(D): O POS
ANTIBODY SCREEN: NEGATIVE

## 2016-08-07 MED ORDER — OXYTOCIN 10 UNIT/ML IJ SOLN
INTRAMUSCULAR | Status: AC
  Filled 2016-08-07: qty 2

## 2016-08-07 MED ORDER — OXYTOCIN 40 UNITS IN LACTATED RINGERS INFUSION - SIMPLE MED
1.0000 m[IU]/min | INTRAVENOUS | Status: DC
  Administered 2016-08-07: 3 m[IU]/min via INTRAVENOUS
  Administered 2016-08-07: 4 m[IU]/min via INTRAVENOUS
  Administered 2016-08-07: 5 m[IU]/min via INTRAVENOUS
  Administered 2016-08-07: 1 m[IU]/min via INTRAVENOUS
  Administered 2016-08-08: 13 m[IU]/min via INTRAVENOUS
  Administered 2016-08-08: 12 m[IU]/min via INTRAVENOUS
  Administered 2016-08-08: 9 m[IU]/min via INTRAVENOUS
  Administered 2016-08-08: 14 m[IU]/min via INTRAVENOUS
  Administered 2016-08-08: 11 m[IU]/min via INTRAVENOUS
  Administered 2016-08-08: 8 m[IU]/min via INTRAVENOUS

## 2016-08-07 MED ORDER — VANCOMYCIN HCL 500 MG IV SOLR
500.0000 mg | Freq: Three times a day (TID) | INTRAVENOUS | Status: DC
  Administered 2016-08-07 – 2016-08-08 (×3): 500 mg via INTRAVENOUS
  Filled 2016-08-07 (×6): qty 500

## 2016-08-07 MED ORDER — LIDOCAINE HCL (PF) 1 % IJ SOLN
INTRAMUSCULAR | Status: AC
  Filled 2016-08-07: qty 30

## 2016-08-07 MED ORDER — ACETAMINOPHEN 325 MG PO TABS
650.0000 mg | ORAL_TABLET | ORAL | Status: DC | PRN
  Administered 2016-08-07: 650 mg via ORAL
  Filled 2016-08-07: qty 2

## 2016-08-07 MED ORDER — LACTATED RINGERS IV SOLN
INTRAVENOUS | Status: DC
  Administered 2016-08-07 – 2016-08-08 (×3): via INTRAVENOUS
  Administered 2016-08-08: 1000 mL via INTRAVENOUS

## 2016-08-07 MED ORDER — INFLUENZA VAC SPLIT QUAD 0.5 ML IM SUSY
0.5000 mL | PREFILLED_SYRINGE | INTRAMUSCULAR | Status: DC
  Filled 2016-08-07: qty 0.5

## 2016-08-07 MED ORDER — TERBUTALINE SULFATE 1 MG/ML IJ SOLN: 0.2500 mg | Freq: Once | INTRAMUSCULAR | Status: DC | PRN

## 2016-08-07 MED ORDER — LACTATED RINGERS IV SOLN: 500.0000 mL | INTRAVENOUS | Status: DC | PRN

## 2016-08-07 MED ORDER — BUTORPHANOL TARTRATE 1 MG/ML IJ SOLN: 1.0000 mg | INTRAMUSCULAR | Status: DC | PRN

## 2016-08-07 MED ORDER — OXYTOCIN BOLUS FROM INFUSION: 500.0000 mL | Freq: Once | INTRAVENOUS | Status: DC

## 2016-08-07 MED ORDER — OXYTOCIN 40 UNITS IN LACTATED RINGERS INFUSION - SIMPLE MED
INTRAVENOUS | Status: AC
  Administered 2016-08-07: 1 m[IU]/min via INTRAVENOUS
  Filled 2016-08-07: qty 1000

## 2016-08-07 MED ORDER — OXYTOCIN 40 UNITS IN LACTATED RINGERS INFUSION - SIMPLE MED: 2.5000 [IU]/h | INTRAVENOUS | Status: DC

## 2016-08-07 MED ORDER — ONDANSETRON HCL 4 MG/2ML IJ SOLN
4.0000 mg | Freq: Four times a day (QID) | INTRAMUSCULAR | Status: DC | PRN
Start: 2016-08-07 — End: 2016-08-08
  Administered 2016-08-08: 4 mg via INTRAVENOUS
  Filled 2016-08-07: qty 2

## 2016-08-07 MED ORDER — AMMONIA AROMATIC IN INHA
RESPIRATORY_TRACT | Status: AC
  Filled 2016-08-07: qty 10

## 2016-08-07 MED ORDER — MISOPROSTOL 200 MCG PO TABS
ORAL_TABLET | ORAL | Status: AC
  Filled 2016-08-07: qty 4

## 2016-08-07 NOTE — H&P (Addendum)
History and Physical Interval Note:  08/07/2016 4:43 PM  Alexandria Orozco  has presented today for INDUCTION OF LABOR (pitocin),  with the diagnosis of 39 weeks and advanced cervical dilation. The various methods of treatment have been discussed with the patient and family. After consideration of risks, benefits and other options for treatment, the patient has consented to  Labor induction .  The patient's history has been reviewed, patient examined, no change in status, and is stable for induction as planned.  See H&P. I have reviewed the patient's chart and labs.  Questions were answered to the patient's satisfaction.    Pt is still 3cm/70/-3, VTX on exam today. Plan Pitocin, later AROM and Epidural  Hoyt Koch

## 2016-08-08 ENCOUNTER — Inpatient Hospital Stay: Payer: 59 | Admitting: Anesthesiology

## 2016-08-08 ENCOUNTER — Encounter: Payer: Self-pay | Admitting: Anesthesiology

## 2016-08-08 LAB — RPR: RPR: NONREACTIVE

## 2016-08-08 MED ORDER — IBUPROFEN 600 MG PO TABS
600.0000 mg | ORAL_TABLET | Freq: Four times a day (QID) | ORAL | Status: DC
  Administered 2016-08-09: 600 mg via ORAL
  Filled 2016-08-08: qty 1

## 2016-08-08 MED ORDER — FENTANYL 2.5 MCG/ML W/ROPIVACAINE 0.2% IN NS 100 ML EPIDURAL INFUSION (ARMC-ANES)
EPIDURAL | Status: AC
  Filled 2016-08-08: qty 100

## 2016-08-08 MED ORDER — ACETAMINOPHEN 325 MG PO TABS: 650.0000 mg | ORAL_TABLET | ORAL | Status: DC | PRN

## 2016-08-08 MED ORDER — DIPHENHYDRAMINE HCL 25 MG PO CAPS: 25.0000 mg | ORAL_CAPSULE | Freq: Four times a day (QID) | ORAL | Status: DC | PRN

## 2016-08-08 MED ORDER — FENTANYL 2.5 MCG/ML W/ROPIVACAINE 0.2% IN NS 100 ML EPIDURAL INFUSION (ARMC-ANES)
EPIDURAL | Status: DC | PRN
  Administered 2016-08-08: 3 mL/h via EPIDURAL

## 2016-08-08 MED ORDER — WITCH HAZEL-GLYCERIN EX PADS: 1.0000 "application " | MEDICATED_PAD | CUTANEOUS | Status: DC | PRN

## 2016-08-08 MED ORDER — SIMETHICONE 80 MG PO CHEW
80.0000 mg | CHEWABLE_TABLET | ORAL | Status: DC | PRN
Start: 2016-08-08 — End: 2016-08-10

## 2016-08-08 MED ORDER — DIBUCAINE 1 % RE OINT: 1.0000 "application " | TOPICAL_OINTMENT | RECTAL | Status: DC | PRN

## 2016-08-08 MED ORDER — SENNOSIDES-DOCUSATE SODIUM 8.6-50 MG PO TABS
2.0000 | ORAL_TABLET | ORAL | Status: DC
  Administered 2016-08-09: 2 via ORAL
  Filled 2016-08-08 (×2): qty 2

## 2016-08-08 MED ORDER — ONDANSETRON HCL 4 MG/2ML IJ SOLN: 4.0000 mg | INTRAMUSCULAR | Status: DC | PRN

## 2016-08-08 MED ORDER — LIDOCAINE HCL (PF) 2 % IJ SOLN
INTRAMUSCULAR | Status: DC | PRN
  Administered 2016-08-08: 10 mL via INTRADERMAL

## 2016-08-08 MED ORDER — LIDOCAINE-EPINEPHRINE (PF) 1.5 %-1:200000 IJ SOLN
INTRAMUSCULAR | Status: DC | PRN
  Administered 2016-08-08: 3 mL via PERINEURAL

## 2016-08-08 MED ORDER — BENZOCAINE-MENTHOL 20-0.5 % EX AERO: 1.0000 "application " | INHALATION_SPRAY | CUTANEOUS | Status: DC | PRN

## 2016-08-08 MED ORDER — ONDANSETRON HCL 4 MG PO TABS: 4.0000 mg | ORAL_TABLET | ORAL | Status: DC | PRN

## 2016-08-08 MED ORDER — PRENATAL MULTIVITAMIN CH
1.0000 | ORAL_TABLET | Freq: Every day | ORAL | Status: DC
  Administered 2016-08-09: 1 via ORAL
  Filled 2016-08-08: qty 1

## 2016-08-08 MED ORDER — BUPIVACAINE HCL (PF) 0.25 % IJ SOLN
INTRAMUSCULAR | Status: DC | PRN
  Administered 2016-08-08: 9 mL via EPIDURAL

## 2016-08-08 MED ORDER — COCONUT OIL OIL: 1.0000 "application " | TOPICAL_OIL | Status: DC | PRN

## 2016-08-08 MED ORDER — ONDANSETRON HCL 4 MG/2ML IJ SOLN: 4.0000 mg | Freq: Once | INTRAMUSCULAR | Status: DC

## 2016-08-08 MED ORDER — LIDOCAINE HCL (PF) 1 % IJ SOLN
INTRAMUSCULAR | Status: DC | PRN
  Administered 2016-08-08: 3 mL

## 2016-08-08 MED ORDER — OXYCODONE-ACETAMINOPHEN 5-325 MG PO TABS
1.0000 | ORAL_TABLET | ORAL | Status: DC | PRN
  Administered 2016-08-09: 1 via ORAL
  Filled 2016-08-08: qty 1

## 2016-08-08 MED ORDER — OXYCODONE-ACETAMINOPHEN 5-325 MG PO TABS: 2.0000 | ORAL_TABLET | ORAL | Status: DC | PRN

## 2016-08-08 NOTE — Anesthesia Procedure Notes (Signed)
Epidural  Start time: 08/08/2016 2:46 PM End time: 08/08/2016 2:53 PM  Staffing Anesthesiologist: Alvin Critchley Performed: anesthesiologist   Preanesthetic Checklist Completed: patient identified, site marked, surgical consent, pre-op evaluation, timeout performed, IV checked, risks and benefits discussed and monitors and equipment checked  Epidural Patient position: sitting Prep: Betadine and site prepped and draped Patient monitoring: heart rate, cardiac monitor, continuous pulse ox and blood pressure Approach: midline Location: L3-L4 Injection technique: LOR air  Needle:  Needle type: Tuohy  Needle gauge: 18 G Needle length: 9 cm Catheter type: closed end Catheter size: 20 Guage Test dose: negative and 1.5% lidocaine with Epi 1:200 K  Assessment Sensory level: T8  Additional Notes Time out called.  Patient placed in sitting position.  Back prepped and draped in sterile fashion.  A skin wheal was made in the L3-L4 interspace with 1% Lidocaine plain.  An 18G Tuohy needle was guided into the epidural space by a loss of resistance technique X 1 . The epidural catheter was threraded 4cm into the space with negative test dose.  The epidural catheter was affixed to the back in sterile fashion.  The patient tolerated the procedure well.

## 2016-08-08 NOTE — Discharge Summary (Signed)
Obstetric Discharge Summary Reason for Admission: induction of labor Prenatal Procedures: none Intrapartum Procedures: spontaneous vaginal delivery Postpartum Procedures: none Complications-Operative and Postpartum: none Hemoglobin  Date Value Ref Range Status  08/07/2016 12.2 12.0 - 16.0 g/dL Final   HCT  Date Value Ref Range Status  08/07/2016 34.9 (L) 35.0 - 47.0 % Final    Physical Exam:  General: alert, appears stated age and no distress Lochia: appropriate Uterine Fundus: firm DVT Evaluation: No evidence of DVT seen on physical exam.  Discharge Diagnoses: Term Pregnancy-delivered  Discharge Information: Date: 08/08/2016 Activity: pelvic rest Diet: routine   Medication List    STOP taking these medications   NIFEdipine 10 MG capsule Commonly known as:  PROCARDIA   zolpidem 5 MG tablet Commonly known as:  AMBIEN     TAKE these medications   ibuprofen 600 MG tablet Commonly known as:  ADVIL,MOTRIN Take 1 tablet (600 mg total) by mouth every 6 (six) hours.   norethindrone 0.35 MG tablet Commonly known as:  MICRONOR,CAMILA,ERRIN Take 1 tablet (0.35 mg total) by mouth daily.   prenatal multivitamin Tabs tablet Take 1 tablet by mouth daily at 12 noon.       Condition: stable Discharge to: home Follow-up Information    Dorthula Nettles, MD Follow up in 6 week(s).   Specialty:  Obstetrics and Gynecology Why:  postpartum visit Contact information: [place] [address] [phone]           Newborn Data: Live born female  Birth Weight:   APGAR: ,   Home with mother.  Alexandria Orozco, [place] 08/08/2016, 6:25 PM

## 2016-08-08 NOTE — Anesthesia Preprocedure Evaluation (Signed)
Anesthesia Evaluation  Patient identified by MRN, date of birth, ID band Patient awake    Reviewed: Allergy & Precautions, NPO status , Patient's Chart, lab work & pertinent test results  Airway Mallampati: II       Dental no notable dental hx.    Pulmonary neg pulmonary ROS,    Pulmonary exam normal        Cardiovascular negative cardio ROS Normal cardiovascular exam     Neuro/Psych  Headaches, negative psych ROS   GI/Hepatic negative GI ROS, Neg liver ROS,   Endo/Other  negative endocrine ROS  Renal/GU negative Renal ROS  negative genitourinary   Musculoskeletal negative musculoskeletal ROS (+)   Abdominal Normal abdominal exam  (+)   Peds negative pediatric ROS (+)  Hematology negative hematology ROS (+)   Anesthesia Other Findings   Reproductive/Obstetrics (+) Pregnancy                             Anesthesia Physical Anesthesia Plan  ASA: II  Anesthesia Plan: Epidural   Post-op Pain Management:    Induction:   Airway Management Planned: Natural Airway  Additional Equipment:   Intra-op Plan:   Post-operative Plan:   Informed Consent:   Dental advisory given  Plan Discussed with: CRNA and Surgeon  Anesthesia Plan Comments:         Anesthesia Quick Evaluation  

## 2016-08-08 NOTE — Progress Notes (Signed)
Subjective:  Doing well no concerns  Objective:   Vitals: Blood pressure 108/68, pulse 74, temperature 98.5 F (36.9 C), temperature source Oral, resp. rate 17, height 5' (1.524 m), weight 178 lb (80.7 kg), last menstrual period 11/07/2015. General: NAD Abdomen: gravid, non-tender Cervical Exam:  Dilation: 4 Effacement (%): 70 Station: -2 Presentation: Vertex Exam by:: Georgianne Fick, MD   FHT: 145, moderate, +accels, no decels Toco: q59min  Results for orders placed or performed during the hospital encounter of 08/07/16 (from the past 24 hour(s))  CBC     Status: Abnormal   Collection Time: 08/07/16  4:11 PM  Result Value Ref Range   WBC 9.5 3.6 - 11.0 K/uL   RBC 3.87 3.80 - 5.20 MIL/uL   Hemoglobin 12.2 12.0 - 16.0 g/dL   HCT 34.9 (L) 35.0 - 47.0 %   MCV 90.1 80.0 - 100.0 fL   MCH 31.6 26.0 - 34.0 pg   MCHC 35.1 32.0 - 36.0 g/dL   RDW 12.9 11.5 - 14.5 %   Platelets 261 150 - 440 K/uL  Type and screen [place]     Status: None   Collection Time: 08/07/16  4:11 PM  Result Value Ref Range   ABO/RH(D) O POS    Antibody Screen NEG    Sample Expiration 08/10/2016   RPR     Status: None   Collection Time: 08/07/16  4:11 PM  Result Value Ref Range   RPR Ser Ql Non Reactive Non Reactive    Assessment:   30 y.o. G2P0101 [redacted]w[redacted]d   Plan:   1) Labor - continue pitocin, AROM performed clear fluid  2) Fetus - cat I tracing

## 2016-08-09 LAB — CBC
HCT: 31.6 % — ABNORMAL LOW (ref 35.0–47.0)
Hemoglobin: 10.8 g/dL — ABNORMAL LOW (ref 12.0–16.0)
MCH: 30.7 pg (ref 26.0–34.0)
MCHC: 34.1 g/dL (ref 32.0–36.0)
MCV: 90 fL (ref 80.0–100.0)
PLATELETS: 256 10*3/uL (ref 150–440)
RBC: 3.51 MIL/uL — ABNORMAL LOW (ref 3.80–5.20)
RDW: 12.8 % (ref 11.5–14.5)
WBC: 15.9 10*3/uL — ABNORMAL HIGH (ref 3.6–11.0)

## 2016-08-09 MED ORDER — IBUPROFEN 600 MG PO TABS
600.0000 mg | ORAL_TABLET | Freq: Four times a day (QID) | ORAL | Status: DC
  Administered 2016-08-09 (×2): 600 mg via ORAL
  Filled 2016-08-09 (×2): qty 1

## 2016-08-09 MED ORDER — NORETHINDRONE 0.35 MG PO TABS: 1.0000 | ORAL_TABLET | Freq: Every day | ORAL | 11 refills | Status: DC

## 2016-08-09 MED ORDER — IBUPROFEN 600 MG PO TABS: 600.0000 mg | ORAL_TABLET | Freq: Four times a day (QID) | ORAL | 0 refills | Status: DC

## 2016-08-09 NOTE — Progress Notes (Signed)
Pt discharged home with infant.  Discharge instructions and follow up appointment given to and reviewed with pt.  Pt verbalized understanding.  Escorted by auxillary. 

## 2016-08-09 NOTE — Anesthesia Postprocedure Evaluation (Signed)
Anesthesia Post Note  Patient: Alexandria Orozco  Procedure(s) Performed: * No procedures listed *  Patient location during evaluation: Mother Baby Anesthesia Type: Epidural Level of consciousness: awake and alert and oriented Pain management: pain level controlled Vital Signs Assessment: post-procedure vital signs reviewed and stable Respiratory status: spontaneous breathing Cardiovascular status: stable Postop Assessment: no headache, no signs of nausea or vomiting and adequate PO intake Anesthetic complications: no    Last Vitals:  Vitals:   08/09/16 0416 08/09/16 0754  BP: (!) 95/49 (!) 112/53  Pulse: 86 67  Resp: 18 20  Temp: 36.6 C 36.7 C    Last Pain:  Vitals:   08/09/16 0754  TempSrc: Oral  PainSc:                  Estill Batten

## 2016-09-16 DIAGNOSIS — Z8614 Personal history of Methicillin resistant Staphylococcus aureus infection: Secondary | ICD-10-CM

## 2016-09-16 HISTORY — DX: Personal history of Methicillin resistant Staphylococcus aureus infection: Z86.14

## 2016-11-26 ENCOUNTER — Telehealth: Payer: Self-pay

## 2016-11-26 NOTE — Telephone Encounter (Signed)
Patient states the OCP she was given isn't covered under her insurance. Patient requests something that is covered. OM#[phone].

## 2016-11-26 NOTE — Telephone Encounter (Signed)
Pt aware its easier for her to check her formulary on what her insurance will pay for. Advised to call back and let us know. KJ CMA

## 2016-12-03 ENCOUNTER — Telehealth: Payer: Self-pay

## 2016-12-03 ENCOUNTER — Other Ambulatory Visit: Payer: Self-pay | Admitting: Obstetrics and Gynecology

## 2016-12-03 MED ORDER — NORETHIN ACE-ETH ESTRAD-FE 1-20 MG-MCG(24) PO TABS: 1.0000 | ORAL_TABLET | Freq: Every day | ORAL | 11 refills | Status: DC

## 2016-12-03 NOTE — Telephone Encounter (Signed)
Pt aware of medication being called in 

## 2016-12-03 NOTE — Telephone Encounter (Signed)
Please advise 

## 2016-12-03 NOTE — Telephone Encounter (Signed)
Pt called stating ins will cover [employer]. If this is not okay for her take please call at [phone].

## 2016-12-03 NOTE — Telephone Encounter (Signed)
Rx has been sent  

## 2017-04-21 ENCOUNTER — Ambulatory Visit
Admission: EM | Admit: 2017-04-21 | Discharge: 2017-04-21 | Disposition: A | Discharge status: Home / Self Care | Payer: 59 | Attending: Family Medicine | Admitting: Family Medicine

## 2017-04-21 ENCOUNTER — Encounter: Payer: Self-pay | Admitting: Emergency Medicine

## 2017-04-21 DIAGNOSIS — B259 Cytomegaloviral disease, unspecified: Secondary | ICD-10-CM

## 2017-04-21 DIAGNOSIS — N9089 Other specified noninflammatory disorders of vulva and perineum: Secondary | ICD-10-CM

## 2017-04-21 DIAGNOSIS — B09 Unspecified viral infection characterized by skin and mucous membrane lesions: Secondary | ICD-10-CM | POA: Diagnosis not present

## 2017-04-21 DIAGNOSIS — R21 Rash and other nonspecific skin eruption: Secondary | ICD-10-CM

## 2017-04-21 MED ORDER — VALACYCLOVIR HCL 1 G PO TABS: 1000.0000 mg | ORAL_TABLET | Freq: Three times a day (TID) | ORAL | 0 refills | Status: DC

## 2017-04-21 MED ORDER — MUPIROCIN 2 % EX OINT: 1.0000 "application " | TOPICAL_OINTMENT | Freq: Two times a day (BID) | CUTANEOUS | 1 refills | Status: DC

## 2017-04-21 MED ORDER — IBUPROFEN 800 MG PO TABS
800.0000 mg | ORAL_TABLET | Freq: Once | ORAL | Status: AC
  Administered 2017-04-21: 800 mg via ORAL

## 2017-04-21 NOTE — ED Provider Notes (Signed)
[facility]    CSN: [id number] Arrival date & time: 04/21/17  1942     History   Chief Complaint Chief Complaint  Patient presents with  . Rash    HPI Alexandria Orozco is a 31 y.o. female.   Patient's 31 year old white female who started having a rash yesterday she broke a fever today temperature 102 and higher. She is chills aching all over and feels miserable. The rash is basically in the perineum area between the buttocks and over the vulvar area. Stasis weeping oozing and very painful. She denies any type and no relations last time she was sexually active about 3 weeks ago. She denies a chance of pregnancy. 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 She is also reports having a home mild to achiness and fever this time as well. Her daughter also been sick and has a rash and fever on Saturday. She will slap 4 and prefers to have his most labs done at [facility] as possible to reduce cost. She has a history of migraines. She also had tonsillectomy as well. Strong family history diabetes in mother and father and she's never smoked. She is allergic to morphine penicillins and sulfa antibiotics   The history is provided by the patient and a parent. No language interpreter was used.  Rash  Location:  Pelvis Quality: blistering, burning, draining, painful, redness and weeping   Associated symptoms: fever     Past Medical History:  Diagnosis Date  . Migraines     Patient Active Problem List   Diagnosis Date Noted  . [redacted] weeks gestation of pregnancy 08/07/2016  . Preterm labor 06/14/2016  . Labor and delivery, indication for care 06/13/2016  . Indication for care in labor and delivery, antepartum 05/30/2016  . Abdominal pain affecting pregnancy 04/11/2016  . Syncope and collapse 04/10/2016    Past Surgical History:  Procedure Laterality Date  . CHOLECYSTECTOMY    . TONSILLECTOMY      OB History    Gravida  Para Term Preterm AB Living   2 1   1   1    SAB TAB Ectopic Multiple Live Births           1       Home Medications    Prior to Admission medications   Medication Sig Start Date End Date Taking? Authorizing Provider  ibuprofen (ADVIL,MOTRIN) 600 MG tablet Take 1 tablet (600 mg total) by mouth every 6 (six) hours. 08/09/16   Malachy Mood, MD  Norethindrone Acetate-Ethinyl Estrad-FE (LOMEDIA 24 FE) 1-20 MG-MCG(24) tablet Take 1 tablet by mouth daily. 12/03/16   Malachy Mood, MD  Prenatal Vit-Fe Fumarate-FA (PRENATAL MULTIVITAMIN) TABS tablet Take 1 tablet by mouth daily at 12 noon.    [provider]  valACYclovir (VALTREX) 1000 MG tablet Take 1 tablet (1,000 mg total) by mouth 3 (three) times daily. 04/21/17   Frederich Cha, MD    Family History Family History  Problem Relation Age of Onset  . Diabetes Mother   . Diabetes Paternal Uncle   . Diabetes Maternal Grandmother     Social History Social History  Substance Use Topics  . Smoking status: Never Smoker  . Smokeless tobacco: Never Used  . Alcohol use No     Allergies   Morphine and related; Penicillins; and Sulfa antibiotics   Review of Systems Review of Systems  Constitutional: Positive for fever.  Genitourinary: Positive for genital sores and  vaginal discharge.  Skin: Positive for rash.  All other systems reviewed and are negative.    Physical Exam Triage Vital Signs ED Triage Vitals  Enc Vitals Group     BP 04/21/17 2050 115/67     Pulse Rate 04/21/17 2050 87     Resp 04/21/17 2050 16     Temp 04/21/17 2050 (!) 101.4 F (38.6 C)     Temp Source 04/21/17 2050 Oral     SpO2 04/21/17 2050 99 %     Weight 04/21/17 2048 161 lb (73 kg)     Height 04/21/17 2048 5\' 1"  (1.549 m)     Head Circumference --      Peak Flow --      Pain Score 04/21/17 2048 5     Pain Loc --      Pain Edu? --      Excl. in [place]? --    No data found.   Updated Vital Signs BP 115/67 (BP Location: Left Arm)    Pulse 87   Temp (!) 101.4 F (38.6 C) (Oral)   Resp 16   Ht 5\' 1"  (1.549 m)   Wt 161 lb (73 kg)   LMP 03/18/2017 (Approximate)   SpO2 99%   Breastfeeding? No   BMI 30.42 kg/m   Visual Acuity Right Eye Distance:   Left Eye Distance:   Bilateral Distance:    Right Eye Near:   Left Eye Near:    Bilateral Near:     Physical Exam  Constitutional: She is oriented to person, place, and time. She appears well-developed and well-nourished.  HENT:  Head: Normocephalic.  Right Ear: External ear normal.  Left Ear: External ear normal.  Eyes: Pupils are equal, round, and reactive to light. EOM are normal.  Neck: Normal range of motion. Neck supple.  Pulmonary/Chest: Effort normal.  Abdominal: Soft.  Genitourinary:     Genitourinary Comments: Patient is a very prominent ulceration and weeping lesions on both sides the labia and rectum. The area almost looks very similar to herpetic infection was very widespread over the whole perineum cultures obtained both wound culture and herpetic and CMV DNA was also obtained from the site. We'll get CMV CBC and CMP blood work drawn through [facility] tomorrow since they  are closed now.  Musculoskeletal: Normal range of motion.  Neurological: She is alert and oriented to person, place, and time.  Skin: Rash noted. There is erythema.  Psychiatric: She has a normal mood and affect.  Vitals reviewed.    UC Treatments / Results  Labs (all labs ordered are listed, but only abnormal results are displayed) Labs Reviewed  AEROBIC/ANAEROBIC CULTURE (SURGICAL/DEEP WOUND)  VIRAL CULTURE, RAPID, CMV  MISC [facility] TEST (SEND OUT)  HERPES SIMPLEX VIRUS(HSV) DNA BY PCR    EKG  EKG Interpretation None       Radiology No results found.  Procedures Procedures (including critical care time)  Medications Ordered in UC Medications  ibuprofen (ADVIL,MOTRIN) tablet 800 mg (800 mg Oral Given 04/21/17 2101)     Initial Impression / Assessment and  Plan / UC Course  I have reviewed the triage vital signs and the nursing notes.  Pertinent labs & imaging results that were available during my care of the patient were reviewed by me and considered in my medical decision making (see chart for details).     Assessment extensive time was taken trying to figure out which lab test obtained in to draw discussed patient in  detail my concern this may be stress CMV virus from her daughter may have infection to we'll place on Valtrex 1 g 3 times a day since the other antiviral agents may not be available here in the [place] she's not had a renal transplant. Hours things get worse she'll need to have further evaluation and be seen closely  Work note given for Tuesday Wednesday and Thursday   Final Clinical Impressions(s) / UC Diagnoses   Final diagnoses:  Viral exanthem  Rash  Cytomegalovirus infection, unspecified cytomegaloviral infection type (HCC)    New Prescriptions New Prescriptions   VALACYCLOVIR (VALTREX) 1000 MG TABLET    Take 1 tablet (1,000 mg total) by mouth 3 (three) times daily.     Controlled Substance Prescriptions [place] Controlled Substance Registry consulted? Not Applicable   Frederich Cha, MD 04/21/17 2154

## 2017-04-21 NOTE — ED Triage Notes (Signed)
Patient reports painful rash on her buttock that started on Friday.

## 2017-04-23 LAB — HERPES SIMPLEX VIRUS(HSV) DNA BY PCR
HSV 1 DNA: NEGATIVE
HSV 2 DNA: POSITIVE — AB

## 2017-04-27 LAB — AEROBIC/ANAEROBIC CULTURE W GRAM STAIN (SURGICAL/DEEP WOUND): Special Requests: NORMAL

## 2017-04-27 LAB — AEROBIC/ANAEROBIC CULTURE (SURGICAL/DEEP WOUND)

## 2017-04-28 ENCOUNTER — Telehealth: Payer: Self-pay

## 2017-04-28 MED ORDER — CLINDAMYCIN HCL 300 MG PO CAPS: 300.0000 mg | ORAL_CAPSULE | Freq: Three times a day (TID) | ORAL | 0 refills | Status: DC

## 2017-07-19 DIAGNOSIS — G43909 Migraine, unspecified, not intractable, without status migrainosus: Secondary | ICD-10-CM | POA: Insufficient documentation

## 2017-07-19 DIAGNOSIS — Z9049 Acquired absence of other specified parts of digestive tract: Secondary | ICD-10-CM | POA: Insufficient documentation

## 2017-07-19 DIAGNOSIS — R109 Unspecified abdominal pain: Secondary | ICD-10-CM | POA: Diagnosis present

## 2017-07-19 DIAGNOSIS — Z793 Long term (current) use of hormonal contraceptives: Secondary | ICD-10-CM | POA: Diagnosis not present

## 2017-07-19 DIAGNOSIS — R112 Nausea with vomiting, unspecified: Secondary | ICD-10-CM | POA: Insufficient documentation

## 2017-07-19 DIAGNOSIS — R1013 Epigastric pain: Principal | ICD-10-CM | POA: Insufficient documentation

## 2017-07-19 NOTE — ED Triage Notes (Signed)
Patient reports having abdominal pain since yesterday with nausea and vomiting.  Reports hx of "pancreatic issues".

## 2017-07-20 ENCOUNTER — Encounter: Payer: Self-pay | Admitting: Internal Medicine

## 2017-07-20 ENCOUNTER — Observation Stay
Admission: EM | Admit: 2017-07-20 | Discharge: 2017-07-21 | Disposition: A | Discharge status: Home / Self Care | Payer: 59 | Attending: Internal Medicine | Admitting: Internal Medicine

## 2017-07-20 ENCOUNTER — Emergency Department: Payer: 59

## 2017-07-20 ENCOUNTER — Other Ambulatory Visit: Payer: Self-pay

## 2017-07-20 DIAGNOSIS — K861 Other chronic pancreatitis: Secondary | ICD-10-CM

## 2017-07-20 DIAGNOSIS — R1013 Epigastric pain: Secondary | ICD-10-CM

## 2017-07-20 DIAGNOSIS — R52 Pain, unspecified: Secondary | ICD-10-CM

## 2017-07-20 DIAGNOSIS — R109 Unspecified abdominal pain: Secondary | ICD-10-CM | POA: Diagnosis present

## 2017-07-20 DIAGNOSIS — R112 Nausea with vomiting, unspecified: Secondary | ICD-10-CM

## 2017-07-20 LAB — COMPREHENSIVE METABOLIC PANEL
ALK PHOS: 58 U/L (ref 38–126)
ALT: 14 U/L (ref 14–54)
AST: 19 U/L (ref 15–41)
Albumin: 4.6 g/dL (ref 3.5–5.0)
Anion gap: 14 (ref 5–15)
BILIRUBIN TOTAL: 0.8 mg/dL (ref 0.3–1.2)
BUN: 11 mg/dL (ref 6–20)
CALCIUM: 10 mg/dL (ref 8.9–10.3)
CO2: 25 mmol/L (ref 22–32)
CREATININE: 0.7 mg/dL (ref 0.44–1.00)
Chloride: 99 mmol/L — ABNORMAL LOW (ref 101–111)
GFR calc Af Amer: >60 mL/min (ref >60)
GFR calc non Af Amer: >60 mL/min (ref >60)
Glucose, Bld: 116 mg/dL — ABNORMAL HIGH (ref 65–99)
Potassium: 3.8 mmol/L (ref 3.5–5.1)
SODIUM: 138 mmol/L (ref 135–145)
TOTAL PROTEIN: 8.5 g/dL — AB (ref 6.5–8.1)

## 2017-07-20 LAB — LIPASE, BLOOD: Lipase: 28 U/L (ref 11–51)

## 2017-07-20 LAB — URINALYSIS, COMPLETE (UACMP) WITH MICROSCOPIC
Bilirubin Urine: NEGATIVE
GLUCOSE, UA: NEGATIVE mg/dL
Hgb urine dipstick: NEGATIVE
Ketones, ur: NEGATIVE mg/dL
Nitrite: NEGATIVE
PH: 7 (ref 5.0–8.0)
Protein, ur: NEGATIVE mg/dL
Specific Gravity, Urine: 1.005 (ref 1.005–1.030)

## 2017-07-20 LAB — MRSA PCR SCREENING: MRSA by PCR: NEGATIVE

## 2017-07-20 LAB — CBC
HCT: 42.2 % (ref 35.0–47.0)
Hemoglobin: 14.2 g/dL (ref 12.0–16.0)
MCH: 29.9 pg (ref 26.0–34.0)
MCHC: 33.6 g/dL (ref 32.0–36.0)
MCV: 88.8 fL (ref 80.0–100.0)
PLATELETS: 296 10*3/uL (ref 150–440)
RBC: 4.75 MIL/uL (ref 3.80–5.20)
RDW: 12.8 % (ref 11.5–14.5)
WBC: 7.2 10*3/uL (ref 3.6–11.0)

## 2017-07-20 LAB — TROPONIN I: Troponin I: <0.03 ng/mL (ref <0.03)

## 2017-07-20 LAB — POCT PREGNANCY, URINE: Preg Test, Ur: NEGATIVE

## 2017-07-20 MED ORDER — KETOROLAC TROMETHAMINE 30 MG/ML IJ SOLN
30.0000 mg | Freq: Four times a day (QID) | INTRAMUSCULAR | Status: DC | PRN
  Administered 2017-07-20 – 2017-07-21 (×3): 30 mg via INTRAVENOUS
  Filled 2017-07-20 (×4): qty 1

## 2017-07-20 MED ORDER — ONDANSETRON HCL 4 MG PO TABS
4.0000 mg | ORAL_TABLET | Freq: Four times a day (QID) | ORAL | Status: DC | PRN
Start: 2017-07-20 — End: 2017-07-21

## 2017-07-20 MED ORDER — PANTOPRAZOLE SODIUM 40 MG IV SOLR
40.0000 mg | Freq: Two times a day (BID) | INTRAVENOUS | Status: DC
  Administered 2017-07-20 – 2017-07-21 (×3): 40 mg via INTRAVENOUS
  Filled 2017-07-20 (×3): qty 40

## 2017-07-20 MED ORDER — ZOLPIDEM TARTRATE 5 MG PO TABS
5.0000 mg | ORAL_TABLET | Freq: Every evening | ORAL | Status: DC | PRN
  Administered 2017-07-20: 5 mg via ORAL
  Filled 2017-07-20: qty 1

## 2017-07-20 MED ORDER — HYDROMORPHONE HCL 1 MG/ML IJ SOLN
0.5000 mg | Freq: Once | INTRAMUSCULAR | Status: AC
  Administered 2017-07-20: 0.5 mg via INTRAVENOUS
  Filled 2017-07-20: qty 1

## 2017-07-20 MED ORDER — HYDROMORPHONE HCL 1 MG/ML IJ SOLN
1.0000 mg | Freq: Once | INTRAMUSCULAR | Status: AC
  Administered 2017-07-20: 1 mg via INTRAVENOUS
  Filled 2017-07-20: qty 1

## 2017-07-20 MED ORDER — ONDANSETRON HCL 4 MG/2ML IJ SOLN
4.0000 mg | Freq: Once | INTRAMUSCULAR | Status: AC
  Administered 2017-07-20: 4 mg via INTRAVENOUS
  Filled 2017-07-20: qty 2

## 2017-07-20 MED ORDER — DIPHENHYDRAMINE HCL 50 MG/ML IJ SOLN
25.0000 mg | Freq: Once | INTRAMUSCULAR | Status: AC
  Administered 2017-07-20: 25 mg via INTRAVENOUS
  Filled 2017-07-20: qty 1

## 2017-07-20 MED ORDER — SODIUM CHLORIDE 0.9 % IV BOLUS (SEPSIS)
1000.0000 mL | Freq: Once | INTRAVENOUS | Status: AC
  Administered 2017-07-20: 1000 mL via INTRAVENOUS

## 2017-07-20 MED ORDER — IBUPROFEN 400 MG PO TABS: 400.0000 mg | ORAL_TABLET | Freq: Four times a day (QID) | ORAL | Status: DC | PRN

## 2017-07-20 MED ORDER — ONDANSETRON HCL 4 MG/2ML IJ SOLN: 4.0000 mg | Freq: Four times a day (QID) | INTRAMUSCULAR | Status: DC | PRN

## 2017-07-20 MED ORDER — ENOXAPARIN SODIUM 40 MG/0.4ML ~~LOC~~ SOLN
40.0000 mg | SUBCUTANEOUS | Status: DC
Start: 2017-07-20 — End: 2017-07-21

## 2017-07-20 MED ORDER — FAMOTIDINE IN NACL 20-0.9 MG/50ML-% IV SOLN
20.0000 mg | Freq: Once | INTRAVENOUS | Status: AC
  Administered 2017-07-20: 20 mg via INTRAVENOUS
  Filled 2017-07-20: qty 50

## 2017-07-20 MED ORDER — ACETAMINOPHEN 325 MG PO TABS: 650.0000 mg | ORAL_TABLET | Freq: Four times a day (QID) | ORAL | Status: DC | PRN

## 2017-07-20 MED ORDER — ACETAMINOPHEN 650 MG RE SUPP: 650.0000 mg | Freq: Four times a day (QID) | RECTAL | Status: DC | PRN

## 2017-07-20 MED ORDER — SODIUM CHLORIDE 0.9 % IV SOLN
INTRAVENOUS | Status: DC
  Administered 2017-07-20 (×2): via INTRAVENOUS

## 2017-07-20 NOTE — ED Provider Notes (Signed)
[facility] Emergency Department Provider Note   ____________________________________________   First MD Initiated Contact with Patient 07/20/17 [phone]     (approximate)  I have reviewed the triage vital signs and the nursing notes.   HISTORY  Chief Complaint Abdominal Pain    HPI Alexandria Orozco is a 31 y.o. female who presents to the ED from home with a chief complaint of abdominal pain, nausea and vomiting.  Patient has a history of gallstone pancreatitis in 2016.  Reports epigastric abdominal pain 2 nights ago associated with nausea and vomiting.  No emesis since yesterday afternoon.  Denies associated fever, chills, chest pain, shortness of breath, dysuria, diarrhea.  Last bowel movement yesterday which is normal for patient.  Denies recent travel or trauma.  Nothing makes her symptoms better or worse.   Past Medical History:  Diagnosis Date  . Migraines     Patient Active Problem List   Diagnosis Date Noted  . [redacted] weeks gestation of pregnancy 08/07/2016  . Preterm labor 06/14/2016  . Labor and delivery, indication for care 06/13/2016  . Indication for care in labor and delivery, antepartum 05/30/2016  . Abdominal pain affecting pregnancy 04/11/2016  . Syncope and collapse 04/10/2016    Past Surgical History:  Procedure Laterality Date  . CHOLECYSTECTOMY    . TONSILLECTOMY      Prior to Admission medications   Medication Sig Start Date End Date Taking? Authorizing Provider  Norethindrone Acetate-Ethinyl Estrad-FE (LOMEDIA 24 FE) 1-20 MG-MCG(24) tablet Take 1 tablet by mouth daily. 12/03/16  Yes Malachy Mood, MD  clindamycin (CLEOCIN) 300 MG capsule Take 1 capsule (300 mg total) by mouth 3 (three) times daily. Patient not taking: Reported on 07/20/2017 04/28/17   Sherlene Shams, MD  ibuprofen (ADVIL,MOTRIN) 600 MG tablet Take 1 tablet (600 mg total) by mouth every 6 (six) hours. Patient not taking: Reported on 07/20/2017 08/09/16    Malachy Mood, MD  mupirocin ointment (BACTROBAN) 2 % Apply 1 application topically 2 (two) times daily. Patient not taking: Reported on 07/20/2017 04/21/17   Frederich Cha, MD  valACYclovir (VALTREX) 1000 MG tablet Take 1 tablet (1,000 mg total) by mouth 3 (three) times daily. Patient not taking: Reported on 07/20/2017 04/21/17   Frederich Cha, MD    Allergies Morphine and related; Penicillins; and Sulfa antibiotics  Family History  Problem Relation Age of Onset  . Diabetes Mother   . Diabetes Paternal Uncle   . Diabetes Maternal Grandmother     Social History Social History   Tobacco Use  . Smoking status: Never Smoker  . Smokeless tobacco: Never Used  Substance Use Topics  . Alcohol use: No  . Drug use: No    Review of Systems  Constitutional: No fever/chills. Eyes: No visual changes. ENT: No sore throat. Cardiovascular: Denies chest pain. Respiratory: Denies shortness of breath. Gastrointestinal: Positive for abdominal pain, nausea and vomiting.  No diarrhea.  No constipation. Genitourinary: Negative for dysuria. Musculoskeletal: Negative for back pain. Skin: Negative for rash. Neurological: Negative for headaches, focal weakness or numbness.   ____________________________________________   PHYSICAL EXAM:  VITAL SIGNS: ED Triage Vitals  Enc Vitals Group     BP 07/19/17 2351 116/82     Pulse Rate 07/19/17 2351 76     Resp 07/19/17 2351 20     Temp 07/19/17 2351 98.5 F (36.9 C)     Temp Source 07/19/17 2351 Oral     SpO2 07/19/17 2351 99 %  Weight 07/19/17 2349 151 lb (68.5 kg)     Height 07/19/17 2349 5' (1.524 m)     Head Circumference --      Peak Flow --      Pain Score 07/19/17 2349 8     Pain Loc --      Pain Edu? --      Excl. in [place]? --      Constitutional: Alert and oriented. Well appearing and in no acute distress. Eyes: Conjunctivae are normal. PERRL. EOMI. Head: Atraumatic. Nose: No congestion/rhinnorhea. Mouth/Throat: Mucous  membranes are moist.  Oropharynx non-erythematous. Neck: No stridor.   Cardiovascular: Normal rate, regular rhythm. Grossly normal heart sounds.  Good peripheral circulation. Respiratory: Normal respiratory effort.  No retractions. Lungs CTAB. Gastrointestinal: Soft and mildly tender tender to palpation epigastrium without rebound or guarding. No distention. No abdominal bruits. No CVA tenderness. Musculoskeletal: No lower extremity tenderness nor edema.  No joint effusions. Neurologic:  Normal speech and language. No gross focal neurologic deficits are appreciated. No gait instability. Skin:  Skin is warm, dry and intact. No rash noted. Psychiatric: Mood and affect are normal. Speech and behavior are normal.  ____________________________________________   LABS (all labs ordered are listed, but only abnormal results are displayed)  Labs Reviewed  COMPREHENSIVE METABOLIC PANEL - Abnormal; Notable for the following components:      Result Value   Chloride 99 (*)    Glucose, Bld 116 (*)    Total Protein 8.5 (*)    All other components within normal limits  URINALYSIS, COMPLETE (UACMP) WITH MICROSCOPIC - Abnormal; Notable for the following components:   Color, Urine STRAW (*)    APPearance CLEAR (*)    Leukocytes, UA SMALL (*)    Bacteria, UA RARE (*)    Squamous Epithelial / LPF 0-5 (*)    All other components within normal limits  LIPASE, BLOOD  CBC  POCT PREGNANCY, URINE  POC URINE PREG, ED   ____________________________________________  EKG  None ____________________________________________  RADIOLOGY  No results found.  ____________________________________________   PROCEDURES  Procedure(s) performed: None  Procedures  Critical Care performed: No  ____________________________________________   INITIAL IMPRESSION / ASSESSMENT AND PLAN / ED COURSE  As part of my medical decision making, I reviewed the following data within the [facility]  History obtained from family, Nursing notes reviewed and incorporated, Labs reviewed, Old chart reviewed and Notes from prior ED visits.   31 year old female who presents with a 2-day history of epigastric pain, nausea and vomiting.  History of gallstone pancreatitis status post MRCP and cholecystectomy in 2016. Differential diagnosis includes, but is not limited to, biliary disease (biliary colic, acute cholecystitis, cholangitis, choledocholithiasis, etc), intrathoracic causes for epigastric abdominal pain including ACS, gastritis, duodenitis, pancreatitis, small bowel or large bowel obstruction, abdominal aortic aneurysm, hernia, and gastritis.  Laboratory and urinalysis results are unremarkable.  LFTs and lipase are within normal limits.  Will initiate IV fluid resuscitation, IV analgesia and reassess.  Will add IV Pepcid.  Clinical Course as of Jul 20 654  Sun Jul 20, 2017  6269 Patient's pain down to 3/10.  I reviewed patient's old chart from 2016 when she presented similarly with an essentially negative workup with the exception of an ultrasound which demonstrated dilated CBD.  Subsequently she had pancreatitis secondary to ERCP and her hospital course was complicated by cholangitis.  I discussed with the patient and her spouse; will obtain ultrasound while she is here. Care transferred to Dr. Reita Cliche.  [  JS]    Clinical Course User Index [JS] Paulette Blanch, MD     ____________________________________________   FINAL CLINICAL IMPRESSION(S) / ED DIAGNOSES  Final diagnoses:  Epigastric pain  Non-intractable vomiting with nausea, unspecified vomiting type      NEW MEDICATIONS STARTED DURING THIS VISIT:  This SmartLink is deprecated. Use AVSMEDLIST instead to display the medication list for a patient.   Note:  This document was prepared using Dragon voice recognition software and may include unintentional dictation errors.    Paulette Blanch, MD 07/20/17 [phone]

## 2017-07-20 NOTE — ED Notes (Signed)
IV placed and IV medications given. Patient tolerated well. Indicated to this RN after asking how many miligrams of Dilaudid she was receiving, that it "usually took 4 mg up on the floor and I was still hurting." Patient educated to call bell and need to use it before attempting to get out of bed to avoid a fall. Family member at bedside sound asleep. Primary RN notified of tasks performed.

## 2017-07-20 NOTE — ED Notes (Signed)
ED Provider at bedside. 

## 2017-07-20 NOTE — H&P (Signed)
[facility] [place] NAME: Alexandria Orozco    MR#:  [id number]  DATE OF BIRTH:  [birth year]  DATE OF ADMISSION:  07/20/2017  PRIMARY CARE PHYSICIAN: Patient, No Pcp Per   REQUESTING/REFERRING PHYSICIAN: Dr. Lisa Roca  CHIEF COMPLAINT:   Chief Complaint  Patient presents with  . Abdominal Pain    HISTORY OF PRESENT ILLNESS:  Alexandria Orozco  is a 31 y.o. female with a known history of intermittent migraines comes to the hospital secondary to worsening abdominal pain associated with nausea vomiting going on for 2 days now. Started spontaneously 2 days ago, in epigastric region, radiation to back. She is already s/p cholecystectomy. Did not eat anything unusual, no recent travel. Also has been nauseated and vomiting and unable to keep anything down. Had pancreatitis about 9 years ago which resulted in cholecystectomy- feels that since then, every 2-3 years, she gets these episodes. Lipase in normal today, lfts within normal limits. [place] abd with stable distended CBD at 38mm. She had ERCP done in 2016 for the same, no stones seen, ampullary dilatation done at that time. She developed post ERCP pancreatitis at that time.  PAST MEDICAL HISTORY:   Past Medical History:  Diagnosis Date  . Migraines     PAST SURGICAL HISTORY:   Past Surgical History:  Procedure Laterality Date  . CHOLECYSTECTOMY    . TONSILLECTOMY      SOCIAL HISTORY:   Social History   Tobacco Use  . Smoking status: Never Smoker  . Smokeless tobacco: Never Used  Substance Use Topics  . Alcohol use: No    FAMILY HISTORY:   Family History  Problem Relation Age of Onset  . Diabetes Mother   . Diabetes Paternal Uncle   . Diabetes Maternal Grandmother   . Pancreatitis Maternal Grandmother     DRUG ALLERGIES:   Allergies  Allergen Reactions  . Morphine And Related Itching  . Penicillins Rash  . Sulfa Antibiotics Rash    REVIEW OF SYSTEMS:   Review of  Systems  Constitutional: Negative for chills, fever, malaise/fatigue and weight loss.  HENT: Negative for ear discharge, ear pain, hearing loss, nosebleeds and tinnitus.   Eyes: Negative for blurred vision, double vision and photophobia.  Respiratory: Negative for cough, hemoptysis, shortness of breath and wheezing.   Cardiovascular: Negative for chest pain, palpitations, orthopnea and leg swelling.  Gastrointestinal: Positive for abdominal pain and nausea. Negative for constipation, diarrhea, heartburn, melena and vomiting.  Genitourinary: Negative for dysuria, frequency, hematuria and urgency.  Musculoskeletal: Negative for back pain, myalgias and neck pain.  Skin: Negative for rash.  Neurological: Negative for dizziness, tingling, tremors, sensory change, speech change, focal weakness and headaches.  Endo/Heme/Allergies: Does not bruise/bleed easily.  Psychiatric/Behavioral: Negative for depression.    MEDICATIONS AT HOME:   Prior to Admission medications   Medication Sig Start Date End Date Taking? Authorizing Provider  Norethindrone Acetate-Ethinyl Estrad-FE (LOMEDIA 24 FE) 1-20 MG-MCG(24) tablet Take 1 tablet by mouth daily. 12/03/16  Yes Malachy Mood, MD  clindamycin (CLEOCIN) 300 MG capsule Take 1 capsule (300 mg total) by mouth 3 (three) times daily. Patient not taking: Reported on 07/20/2017 04/28/17   Sherlene Shams, MD  ibuprofen (ADVIL,MOTRIN) 600 MG tablet Take 1 tablet (600 mg total) by mouth every 6 (six) hours. Patient not taking: Reported on 07/20/2017 08/09/16   Malachy Mood, MD  mupirocin ointment (BACTROBAN) 2 % Apply 1 application topically 2 (two) times daily. Patient not  taking: Reported on 07/20/2017 04/21/17   Frederich Cha, MD  valACYclovir (VALTREX) 1000 MG tablet Take 1 tablet (1,000 mg total) by mouth 3 (three) times daily. Patient not taking: Reported on 07/20/2017 04/21/17   Frederich Cha, MD      VITAL SIGNS:  Blood pressure 111/75, pulse 75,  temperature 98.5 F (36.9 C), temperature source Oral, resp. rate (!) 23, height 5' (1.524 m), weight 68.5 kg (151 lb), last menstrual period 07/02/2017, SpO2 100 %.  PHYSICAL EXAMINATION:   Physical Exam  GENERAL:  32 y.o.-year-old patient lying in the bed with no acute distress.  EYES: Pupils equal, round, reactive to light and accommodation. No scleral icterus. Extraocular muscles intact.  HEENT: Head atraumatic, normocephalic. Oropharynx and nasopharynx clear.  NECK:  Supple, no jugular venous distention. No thyroid enlargement, no tenderness.  LUNGS: Normal breath sounds bilaterally, no wheezing, rales,rhonchi or crepitation. No use of accessory muscles of respiration.  CARDIOVASCULAR: S1, S2 normal. No murmurs, rubs, or gallops.  ABDOMEN: Soft, minimally tender in epigastric region, no guarding or rigidity, nondistended. Bowel sounds present. No organomegaly or mass.  EXTREMITIES: No pedal edema, cyanosis, or clubbing.  NEUROLOGIC: Cranial nerves II through XII are intact. Muscle strength 5/5 in all extremities. Sensation intact. Gait not checked.  PSYCHIATRIC: The patient is alert and oriented x 3.  SKIN: No obvious rash, lesion, or ulcer.   LABORATORY PANEL:   CBC Recent Labs  Lab 07/19/17 2352  WBC 7.2  HGB 14.2  HCT 42.2  PLT 296   ------------------------------------------------------------------------------------------------------------------  Chemistries  Recent Labs  Lab 07/19/17 2352  NA 138  K 3.8  CL 99*  CO2 25  GLUCOSE 116*  BUN 11  CREATININE 0.70  CALCIUM 10.0  AST 19  ALT 14  ALKPHOS 58  BILITOT 0.8   ------------------------------------------------------------------------------------------------------------------  Cardiac Enzymes Recent Labs  Lab 07/19/17 2352  TROPONINI <0.03   ------------------------------------------------------------------------------------------------------------------  RADIOLOGY:  US Abdomen Limited  Ruq  Result Date: 07/20/2017 CLINICAL DATA:  Epigastric abdominal pain for the past 2 days. History of a dilated common bile duct following cholecystectomy. EXAM: ULTRASOUND ABDOMEN LIMITED RIGHT UPPER QUADRANT COMPARISON:  11/06/2014 and abdomen and pelvis CT dated 10/29/2008. FINDINGS: Gallbladder: Surgically absent. Common bile duct: Diameter: 8.8 mm proximally, larger in the midportion of the duct, unchanged. Liver: No focal lesion identified. Within normal limits in parenchymal echogenicity. No intrahepatic biliary ductal dilatation. Portal vein is patent on color Doppler imaging with normal direction of blood flow towards the liver. IMPRESSION: 1. Stable post cholecystectomy common duct dilatation without intrahepatic dilatation. 2. No acute abnormality. Electronically Signed   By: Claudie Revering M.D.   On: 07/20/2017 08:07    EKG:   Orders placed or performed during the hospital encounter of 07/20/17  . ED EKG  . ED EKG    IMPRESSION AND PLAN:   Alexandria Orozco  is a 31 y.o. female with a known history of intermittent migraines comes to the hospital secondary to worsening abdominal pain associated with nausea vomiting going on for 2 days now.  #1 Abdominal pain with nausea/vomiting- not sure if its pancreatitis - CT of abd is not done - s/p cholecystectomy, CBD not dilated on [place] abd - admit, IV fluids, NPO, IV toradol, anti emetics -- advance diet as tolerated - If worsens, consider CT abd and also GI consult - add IV protonix as well  #2 Migraines- stable now  #3 DVT Prophylaxis- lovenox   All the records are reviewed and case discussed with  ED provider. Management plans discussed with the patient, family and they are in agreement.  CODE STATUS: Full Code  TOTAL TIME TAKING CARE OF THIS PATIENT: 50 minutes.    Gladstone Lighter M.D on 07/20/2017 at 11:06 AM  Between 7am to 6pm - Pager - [phone]  After 6pm go to [email or web address] - password [email or web address]  [facility]  Office  [phone]  CC: Primary care physician; Patient, No Pcp Per

## 2017-07-20 NOTE — ED Provider Notes (Addendum)
[facility]  I accepted care from Dr. Beather Arbour ____________________________________________    LABS (pertinent positives/negatives)  I, Lisa Roca, MD have personally reviewed the lab reports noted below.  Labs Reviewed  COMPREHENSIVE METABOLIC PANEL - Abnormal; Notable for the following components:      Result Value   Chloride 99 (*)    Glucose, Bld 116 (*)    Total Protein 8.5 (*)    All other components within normal limits  URINALYSIS, COMPLETE (UACMP) WITH MICROSCOPIC - Abnormal; Notable for the following components:   Color, Urine STRAW (*)    APPearance CLEAR (*)    Leukocytes, UA SMALL (*)    Bacteria, UA RARE (*)    Squamous Epithelial / LPF 0-5 (*)    All other components within normal limits  URINE CULTURE  LIPASE, BLOOD  CBC  POC URINE PREG, ED  POCT PREGNANCY, URINE    EKG.  65 bpm normal sinus rhythm.  Narrow QRS.  Normal axis.  Normal ST and T wave. ____________________________________________    RADIOLOGY All xrays were viewed by me. Imaging interpreted by radiologist.  I, Lisa Roca MD have personally reviewed the imaging report noted below.  [place] ruq:  IMPRESSION: 1. Stable post cholecystectomy common duct dilatation without intrahepatic dilatation. 2. No acute abnormality.  ____________________________________________   PROCEDURES  Procedure(s) performed: None  Critical Care performed: None  ____________________________________________   INITIAL IMPRESSION / ASSESSMENT AND PLAN / ED COURSE   Pertinent labs & imaging results that were available during my care of the patient were reviewed by me and considered in my medical decision making (see chart for details).  Signed out by overnight physician, nonspecific, undetermined source of upper abdominal pain, awaiting right upper quadrant ultrasound given history of biliary colic, cholecystectomy, and apparently history of a retained stone causing pink otitis at one point  in the past.  Ultrasound is reassuring for no additional concerning findings.   When the back back to reevaluate patient around 830, patient was nearly in tears stating continued uncontrolled pain.  Patient states that she has a history of pancreatitis and chronic pancreatitis where her lipase does not elevate.  She states that this is happened to her before usually once per year, and she needs 2 days in the hospital with hydration and adequate symptomatic relief.  We discussed reassuring evaluation and offered whether or not she felt like she could tolerate liquid diet at home and potentially 1-2 days of hydrocodone.  Patient states that that will not help her and that she needs something stronger than that.  I did give patient additional dose of IV medication for uncontrolled pain under presumed diagnosis of potential chronic pancreatitis, and consult to the hospitalist service for likely observation versus admission.     Patient / Family / Caregiver informed of clinical course, medical decision-making process, and agree with plan.  Consultations: Hospitalist for admission.  Addended to include EKG interpretation.  ____________________________________________   FINAL CLINICAL IMPRESSION(S) / ED DIAGNOSES  Final diagnoses:  Epigastric pain  Non-intractable vomiting with nausea, unspecified vomiting type  Uncontrolled pain  Chronic pancreatitis, unspecified pancreatitis type ([place])        Lisa Roca, MD 07/20/17 5176    Lisa Roca, MD 07/20/17 [phone]

## 2017-07-21 LAB — CBC
HEMATOCRIT: 34.4 % — AB (ref 35.0–47.0)
Hemoglobin: 11.5 g/dL — ABNORMAL LOW (ref 12.0–16.0)
MCH: 30.2 pg (ref 26.0–34.0)
MCHC: 33.5 g/dL (ref 32.0–36.0)
MCV: 90.2 fL (ref 80.0–100.0)
PLATELETS: 227 10*3/uL (ref 150–440)
RBC: 3.82 MIL/uL (ref 3.80–5.20)
RDW: 12.9 % (ref 11.5–14.5)
WBC: 5.6 10*3/uL (ref 3.6–11.0)

## 2017-07-21 LAB — COMPREHENSIVE METABOLIC PANEL
ALBUMIN: 3 g/dL — AB (ref 3.5–5.0)
ALT: 11 U/L — AB (ref 14–54)
AST: 13 U/L — AB (ref 15–41)
Alkaline Phosphatase: 40 U/L (ref 38–126)
Anion gap: 6 (ref 5–15)
BUN: 9 mg/dL (ref 6–20)
CO2: 23 mmol/L (ref 22–32)
CREATININE: 0.67 mg/dL (ref 0.44–1.00)
Calcium: 8 mg/dL — ABNORMAL LOW (ref 8.9–10.3)
Chloride: 109 mmol/L (ref 101–111)
GFR calc Af Amer: >60 mL/min (ref >60)
GLUCOSE: 86 mg/dL (ref 65–99)
POTASSIUM: 3.7 mmol/L (ref 3.5–5.1)
SODIUM: 138 mmol/L (ref 135–145)
Total Bilirubin: 0.8 mg/dL (ref 0.3–1.2)
Total Protein: 5.8 g/dL — ABNORMAL LOW (ref 6.5–8.1)

## 2017-07-21 MED ORDER — TRAMADOL HCL 50 MG PO TABS: 50.0000 mg | ORAL_TABLET | Freq: Four times a day (QID) | ORAL | 0 refills | Status: DC | PRN

## 2017-07-21 MED ORDER — HYOSCYAMINE SULFATE 0.125 MG PO TBDP
0.1250 mg | ORAL_TABLET | Freq: Three times a day (TID) | ORAL | Status: DC
  Administered 2017-07-21: 0.125 mg via SUBLINGUAL
  Filled 2017-07-21 (×3): qty 1

## 2017-07-21 MED ORDER — PANTOPRAZOLE SODIUM 40 MG PO TBEC: 40.0000 mg | DELAYED_RELEASE_TABLET | Freq: Two times a day (BID) | ORAL | 0 refills | Status: DC

## 2017-07-21 MED ORDER — HYOSCYAMINE SULFATE 0.125 MG PO TABS: 0.1250 mg | ORAL_TABLET | Freq: Three times a day (TID) | ORAL | 0 refills | Status: DC

## 2017-07-21 NOTE — Progress Notes (Signed)
     Alexandria Orozco was admitted to the [facility] on 07/20/2017 for an acute medical condition and is being Discharged on  07/21/2017 .  She will need another 5-6 days for recovery and so advised to stay away from work until then. So please excuse her from work for the above  Days. Should be able to return to work without any restrictions from 07/28/17.  Call Gladstone Lighter  MD, [facility] at  [phone] with questions.  Gladstone Lighter M.D on 07/21/2017,at 11:36 AM  [facility] [address] [place] [address]

## 2017-07-21 NOTE — Progress Notes (Signed)
IV was removed. Discharge instructions, follow-up appointments, and prescriptions were provided to the pt. All questions answered. The pt was taken downstairs via wheelchair by volunteers.  

## 2017-07-21 NOTE — Discharge Summary (Signed)
[place] [facility] [place] NAME: Alexandria Orozco    MR#:  [id number]  DATE OF BIRTH:  1985/09/25  DATE OF ADMISSION:  07/20/2017   ADMITTING PHYSICIAN: Gladstone Lighter, MD  DATE OF DISCHARGE: 07/21/2017  3:10 PM  PRIMARY CARE PHYSICIAN: Patient, No Pcp Per   ADMISSION DIAGNOSIS:   Epigastric pain [R10.13] Uncontrolled pain [R52] Chronic pancreatitis, unspecified pancreatitis type ([place]) [K86.1] Non-intractable vomiting with nausea, unspecified vomiting type [R11.2]  DISCHARGE DIAGNOSIS:   Active Problems:   Abdominal pain   SECONDARY DIAGNOSIS:   Past Medical History:  Diagnosis Date  . Migraines     HOSPITAL COURSE:   Alexandria Orozco  is a 31 y.o. female with a known history of intermittent migraines comes to the hospital secondary to worsening abdominal pain associated with nausea vomiting going on for 2 days now.  #1 Abdominal pain with nausea/vomiting- likely acute gastritis - lots of stress at work, epigastric pain - started protonix bid, hyoscyamine for IBD abdominal pain for a week - less like;y to be pancreatitis - s/p cholecystectomy, CBD not dilated on [place] abd - received IV fluids, improved and tolerating solid diet -- outpatient gi follow up recommended - discharge today  #2 Migraines- stable now   DISCHARGE CONDITIONS:   Guarded  CONSULTS OBTAINED:   None  DRUG ALLERGIES:   Allergies  Allergen Reactions  . Morphine And Related Itching  . Penicillins Rash  . Sulfa Antibiotics Rash   DISCHARGE MEDICATIONS:   Allergies as of 07/21/2017      Reactions   Morphine And Related Itching   Penicillins Rash   Sulfa Antibiotics Rash      Medication List    STOP taking these medications   clindamycin 300 MG capsule Commonly known as:  CLEOCIN   ibuprofen 600 MG tablet Commonly known as:  ADVIL,MOTRIN   mupirocin ointment 2 % Commonly known as:  BACTROBAN   valACYclovir 1000 MG tablet Commonly  known as:  VALTREX     TAKE these medications   hyoscyamine 0.125 MG tablet Commonly known as:  LEVSIN, ANASPAZ Take 1 tablet (0.125 mg total) 3 (three) times daily by mouth.   Norethindrone Acetate-Ethinyl Estrad-FE 1-20 MG-MCG(24) tablet Commonly known as:  LOMEDIA 24 FE Take 1 tablet by mouth daily.   pantoprazole 40 MG tablet Commonly known as:  PROTONIX Take 1 tablet (40 mg total) 2 (two) times daily before a meal by mouth.   traMADol 50 MG tablet Commonly known as:  ULTRAM Take 1 tablet (50 mg total) every 6 (six) hours as needed by mouth.        DISCHARGE INSTRUCTIONS:   1. GI f/u in 2-3 days 2. PCP f/u in 1-2 weeks  DIET:   Regular diet  ACTIVITY:   Activity as tolerated  OXYGEN:   Home Oxygen: No.  Oxygen Delivery: room air  DISCHARGE LOCATION:   home   If you experience worsening of your admission symptoms, develop shortness of breath, life threatening emergency, suicidal or homicidal thoughts you must seek medical attention immediately by calling 911 or calling your MD immediately  if symptoms less severe.  You Must read complete instructions/literature along with all the possible adverse reactions/side effects for all the Medicines you take and that have been prescribed to you. Take any new Medicines after you have completely understood and accpet all the possible adverse reactions/side effects.   Please note  You were cared for by a hospitalist during your  hospital stay. If you have any questions about your discharge medications or the care you received while you were in the hospital after you are discharged, you can call the unit and asked to speak with the hospitalist on call if the hospitalist that took care of you is not available. Once you are discharged, your primary care physician will handle any further medical issues. Please note that NO REFILLS for any discharge medications will be authorized once you are discharged, as it is imperative that  you return to your primary care physician (or establish a relationship with a primary care physician if you do not have one) for your aftercare needs so that they can reassess your need for medications and monitor your lab values.    On the day of Discharge:  VITAL SIGNS:   Blood pressure 115/71, pulse 65, temperature 98.6 F (37 C), temperature source Oral, resp. rate 16, height 5' (1.524 m), weight 68.5 kg (151 lb), last menstrual period 07/02/2017, SpO2 99 %.  PHYSICAL EXAMINATION:    GENERAL:  31 y.o.-year-old patient lying in the bed with no acute distress.  EYES: Pupils equal, round, reactive to light and accommodation. No scleral icterus. Extraocular muscles intact.  HEENT: Head atraumatic, normocephalic. Oropharynx and nasopharynx clear.  NECK:  Supple, no jugular venous distention. No thyroid enlargement, no tenderness.  LUNGS: Normal breath sounds bilaterally, no wheezing, rales,rhonchi or crepitation. No use of accessory muscles of respiration.  CARDIOVASCULAR: S1, S2 normal. No murmurs, rubs, or gallops.  ABDOMEN: Soft, minimally tender in epigastric region, no guarding or rigidity, nondistended. Bowel sounds present. No organomegaly or mass.  EXTREMITIES: No pedal edema, cyanosis, or clubbing.  NEUROLOGIC: Cranial nerves II through XII are intact. Muscle strength 5/5 in all extremities. Sensation intact. Gait not checked.  PSYCHIATRIC: The patient is alert and oriented x 3.  SKIN: No obvious rash, lesion, or ulcer.   DATA REVIEW:   CBC Recent Labs  Lab 07/21/17 0453  WBC 5.6  HGB 11.5*  HCT 34.4*  PLT 227    Chemistries  Recent Labs  Lab 07/21/17 0453  NA 138  K 3.7  CL 109  CO2 23  GLUCOSE 86  BUN 9  CREATININE 0.67  CALCIUM 8.0*  AST 13*  ALT 11*  ALKPHOS 40  BILITOT 0.8     Microbiology Results  Results for orders placed or performed during the hospital encounter of 07/20/17  MRSA PCR Screening     Status: None   Collection Time: 07/20/17   6:00 PM  Result Value Ref Range Status   MRSA by PCR NEGATIVE NEGATIVE Final    Comment:        The GeneXpert MRSA Assay (FDA approved for NASAL specimens only), is one component of a comprehensive MRSA colonization surveillance program. It is not intended to diagnose MRSA infection nor to guide or monitor treatment for MRSA infections.     RADIOLOGY:  No results found.   Management plans discussed with the patient, family and they are in agreement.  CODE STATUS:     Code Status Orders  (From admission, onward)        Start     Ordered   07/20/17 1236  Full code  Continuous     07/20/17 1235    Code Status History    Date Active Date Inactive Code Status Order ID Comments User Context   08/08/2016 23:09 08/10/2016 02:12 Full Code [id number]  Malachy Mood, MD Inpatient   08/07/2016 16:50 08/08/2016 23:09  Full Code [id number]  Gae Dry, MD Inpatient   07/19/2016 12:49 07/19/2016 17:02 Full Code [id number]  Gae Dry, MD Inpatient   07/07/2016 15:27 07/07/2016 23:09 Full Code [id number]  Burlene Arnt, [place] Inpatient   06/14/2016 05:10 06/14/2016 05:34 Full Code [id number]  Will Bonnet, MD Inpatient   06/13/2016 22:24 06/14/2016 05:10 Full Code [id number]  Will Bonnet, MD Inpatient   06/10/2016 00:09 06/10/2016 06:05 Full Code [id number]  Rod Can, [place] Inpatient   05/30/2016 13:50 05/30/2016 16:51 Full Code [id number]  Rod Can, CNM Inpatient   04/11/2016 00:29 04/11/2016 02:29 Full Code [id number]  Burlene Arnt, [place] Inpatient   04/10/2016 12:15 04/10/2016 17:56 Full Code [id number]  Burlene Arnt, CNM Inpatient      TOTAL TIME TAKING CARE OF THIS PATIENT: 38 minutes.    Gladstone Lighter M.D on 07/21/2017 at 3:18 PM  Between 7am to 6pm - Pager - [phone]  After 6pm go to [email or web address] - Proofreader  [facility]  Office  [phone]  CC: Primary care physician; Patient, No Pcp Per   Note:  This dictation was prepared with Dragon dictation along with smaller phrase technology. Any transcriptional errors that result from this process are unintentional.

## 2017-07-22 LAB — URINE CULTURE: Culture: <10000 — AB

## 2017-07-22 LAB — HIV ANTIBODY (ROUTINE TESTING W REFLEX): HIV SCREEN 4TH GENERATION: NONREACTIVE

## 2017-07-27 IMAGING — US US OB LIMITED
1 series · 14 of 28 positions shown · non-contrast
Comparison: none

CLINICAL DATA: 30-year-old pregnant female presenting with pelvic
pain and dizziness. Gestational age by LMP and first ultrasound is
22 weeks, 2 days.

EXAM:
LIMITED OBSTETRIC ULTRASOUND

[Series 1: us ob limited · 0.22mm/px · 14 of 28 slices shown]
[im 2/28]
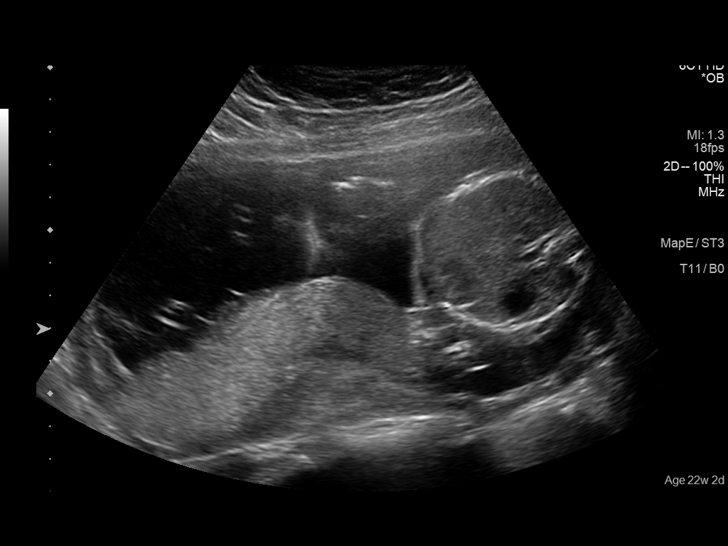
[im 4/28]
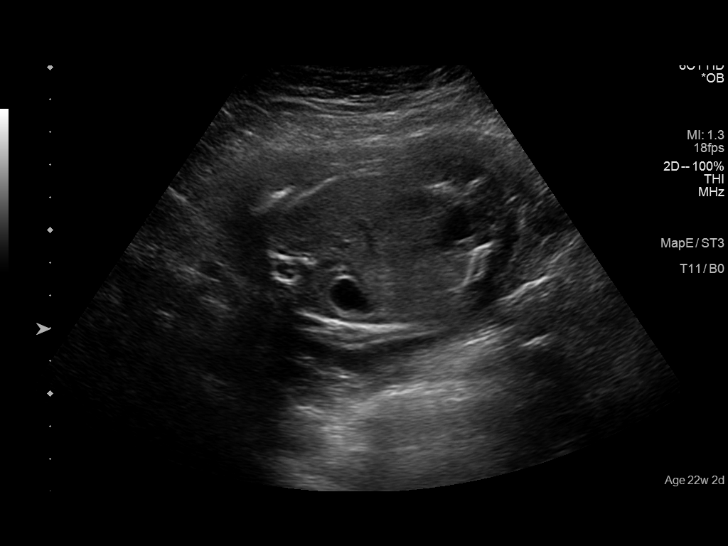
[im 6/28]
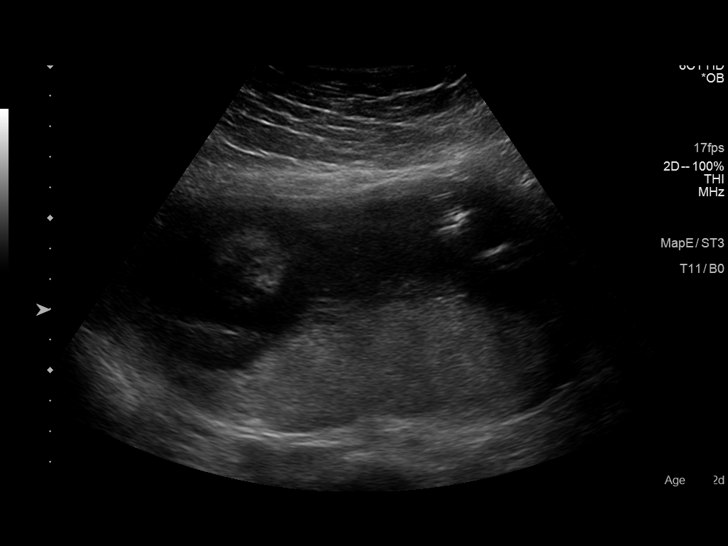
[im 8/28]
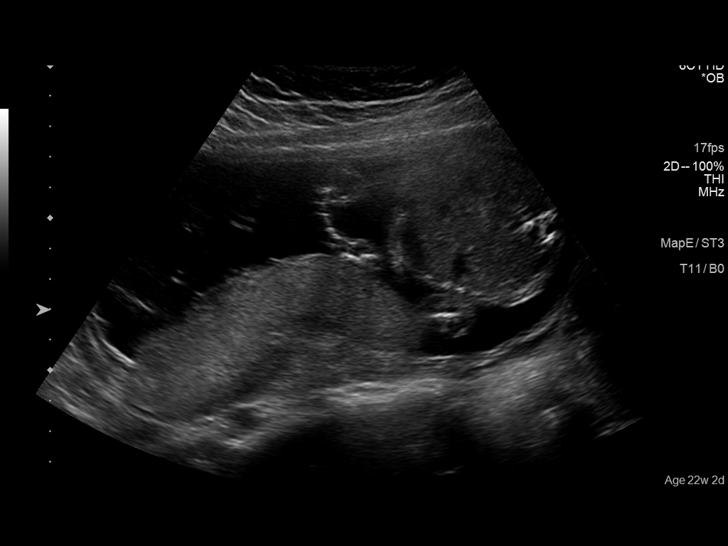
[im 10/28]
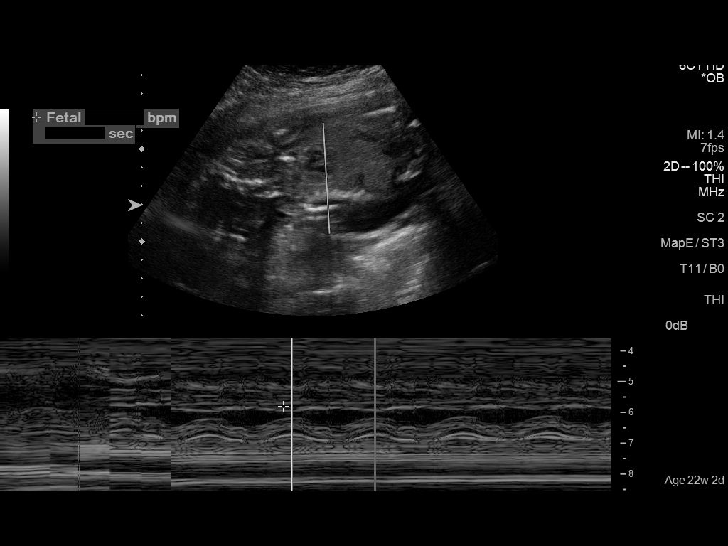
[im 12/28]
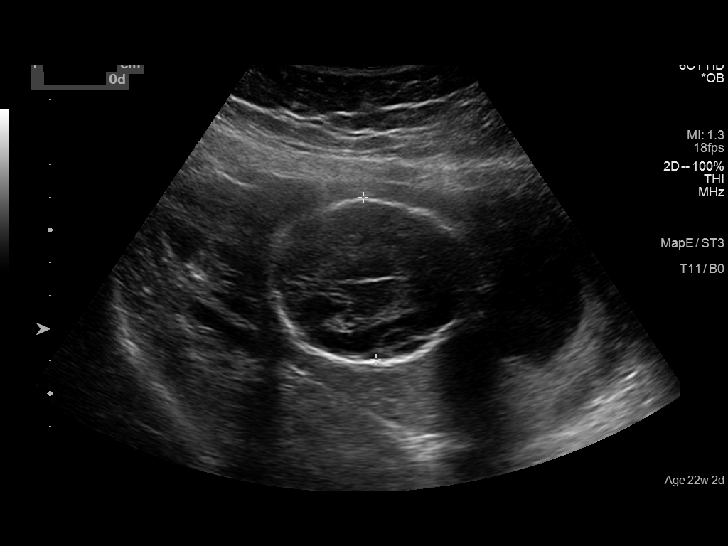
[im 14/28]
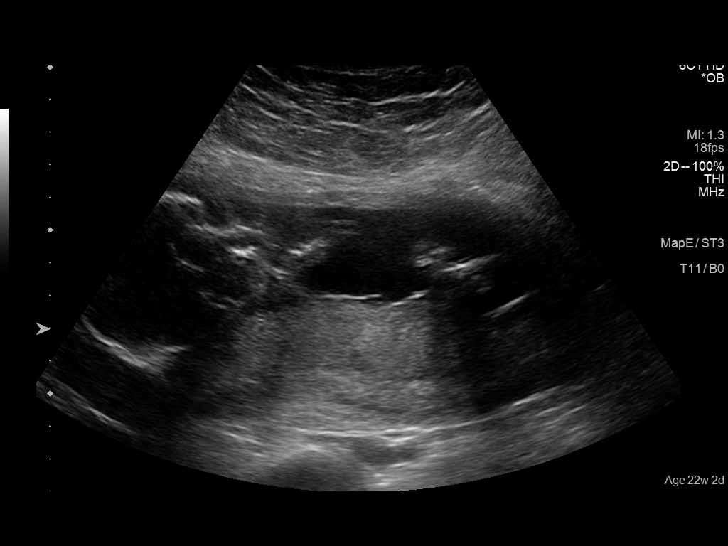
[im 16/28]
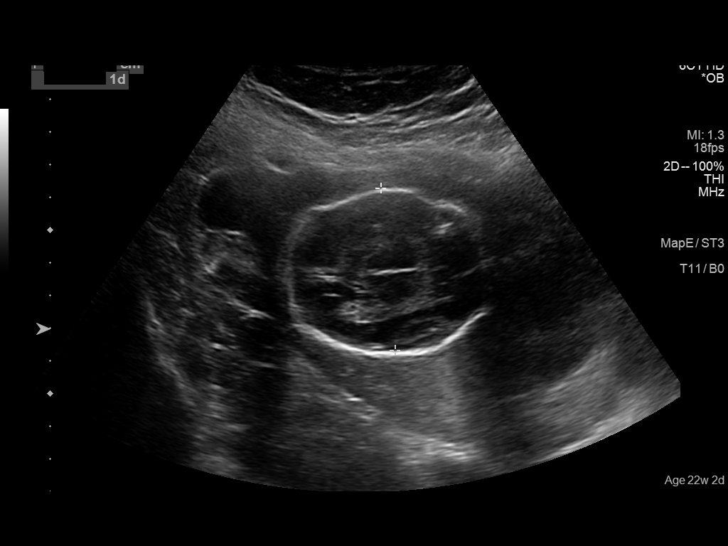
[im 18/28]
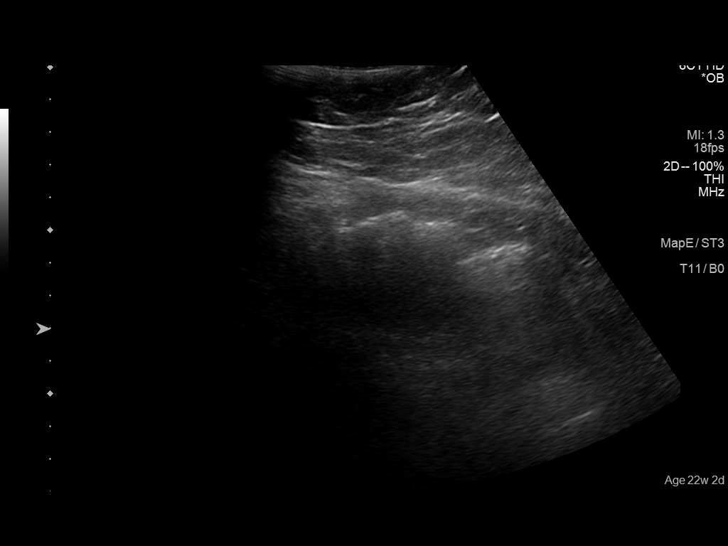
[im 20/28]
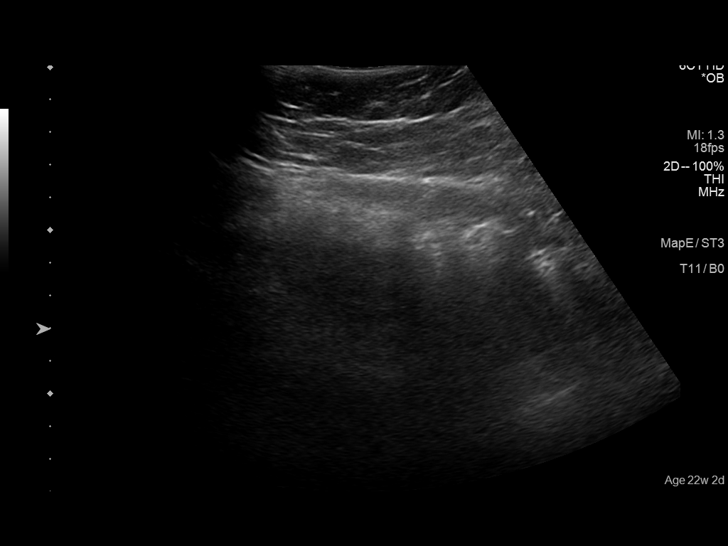
[im 22/28]
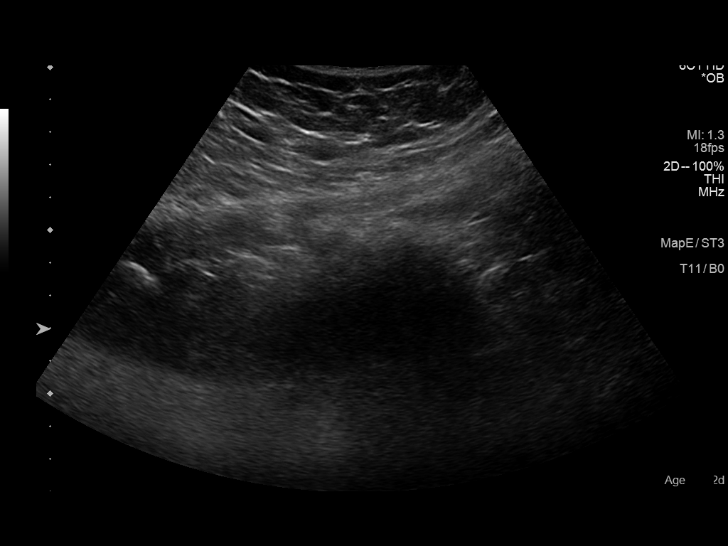
[im 24/28]
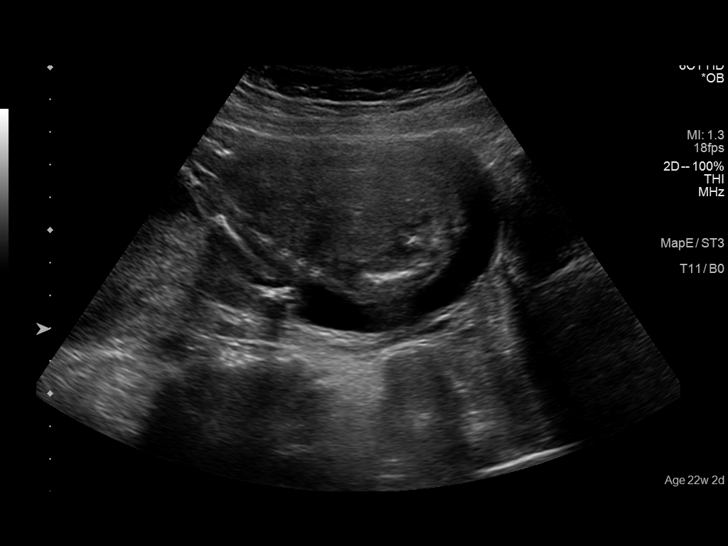
[im 26/28]
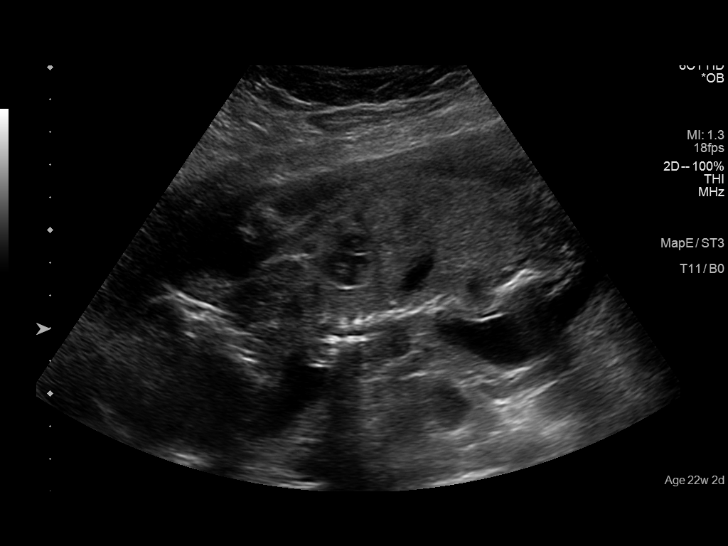
[im 28/28]
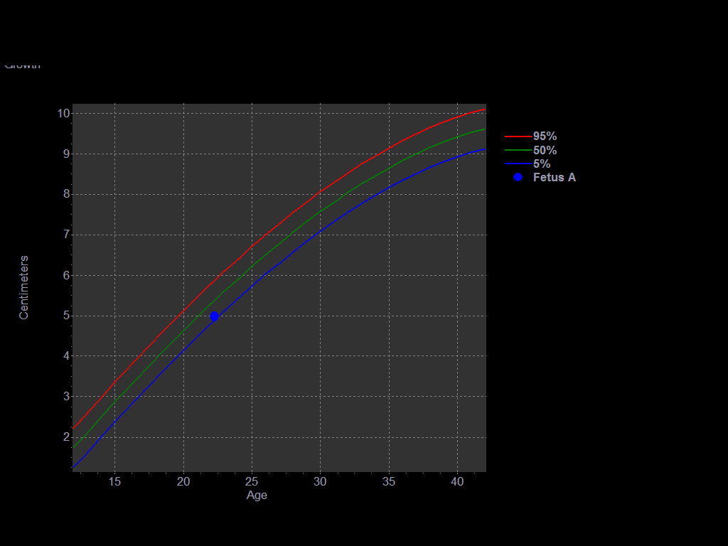

[14 of 28 positions shown; findings below may reference images not displayed]

FINDINGS: Number of Fetuses:  Single

Heart Rate:  123 bpm

Movement:  Detected

Presentation: Breech

Placental Location: Posterior

Previa: No

Amniotic Fluid (Subjective):  Within normal limits.

BPD:  5cm 21w  1d

MATERNAL FINDINGS:

Cervix:  Appears closed.

Uterus/Adnexae:  No abnormality visualized.
IMPRESSION: Single live intrauterine pregnancy. No acute abnormality identified.

This exam is performed on an emergent basis and does not
comprehensively evaluate fetal size, dating, or anatomy; follow-up
complete OB US should be considered if further fetal assessment is
warranted.

## 2017-07-28 ENCOUNTER — Encounter: Payer: Self-pay | Admitting: Gastroenterology

## 2017-07-28 ENCOUNTER — Ambulatory Visit: Payer: 59 | Admitting: Gastroenterology

## 2017-07-28 ENCOUNTER — Other Ambulatory Visit: Payer: Self-pay

## 2017-07-28 DIAGNOSIS — R10816 Epigastric abdominal tenderness: Secondary | ICD-10-CM | POA: Diagnosis not present

## 2017-07-28 DIAGNOSIS — R1012 Left upper quadrant pain: Secondary | ICD-10-CM | POA: Diagnosis not present

## 2017-07-28 DIAGNOSIS — R1013 Epigastric pain: Secondary | ICD-10-CM

## 2017-07-28 NOTE — H&P (View-Only) (Signed)
Gastroenterology Consultation  Referring Provider:     Dr. Tressia Miners Primary Care Physician:  Patient, No Pcp Per Primary Gastroenterologist:  Dr. Allen Norris     Reason for Consultation:     Abdominal pain        HPI:   Alexandria Orozco is a 31 y.o. y/o female referred for consultation & management of abdominal pain by Dr. Patient, No Pcp Per.  This patient comes in today after being seen in the emergency room for abdominal pain. The patient had seen me in the past approximately 9 years ago for gallstone pancreatitis and had an ERCP done at that time with a stone extraction. The patient has had 3 episodes of pancreatitis in the past as reported by her. She also reports that she was seen in the past by Dr. Vira Agar and Dr. Candace Cruise. The patient reports that the pain is epigastric and stabbing. She also states that it goes into her back. The patient was in the ER on November 5 and her labs did not show any cause for abdominal pain. The patient also had an ultrasound that did not show any cause for her symptoms. The patient's lipase and LFTs were not elevated. She was in the hospital for a day and then discharged.  Past Medical History:  Diagnosis Date  . Migraines     Past Surgical History:  Procedure Laterality Date  . CHOLECYSTECTOMY    . TONSILLECTOMY      Prior to Admission medications   Medication Sig Start Date End Date Taking? Authorizing Provider  hyoscyamine (LEVSIN, ANASPAZ) 0.125 MG tablet Take 1 tablet (0.125 mg total) 3 (three) times daily by mouth. 07/21/17   Gladstone Lighter, MD  Norethindrone Acetate-Ethinyl Estrad-FE (LOMEDIA 24 FE) 1-20 MG-MCG(24) tablet Take 1 tablet by mouth daily. 12/03/16   Malachy Mood, MD  pantoprazole (PROTONIX) 40 MG tablet Take 1 tablet (40 mg total) 2 (two) times daily before a meal by mouth. 07/21/17   Gladstone Lighter, MD  traMADol (ULTRAM) 50 MG tablet Take 1 tablet (50 mg total) every 6 (six) hours as needed by mouth. 07/21/17 07/21/18   Gladstone Lighter, MD    Family History  Problem Relation Age of Onset  . Diabetes Mother   . Diabetes Paternal Uncle   . Diabetes Maternal Grandmother   . Pancreatitis Maternal Grandmother      Social History   Tobacco Use  . Smoking status: Never Smoker  . Smokeless tobacco: Never Used  Substance Use Topics  . Alcohol use: No  . Drug use: No    Allergies as of 07/28/2017 - Review Complete 07/20/2017  Allergen Reaction Noted  . Morphine and related Itching 09/01/2015  . Ampicillin Rash 06/02/2014  . Morphine Rash 10/01/2015  . Penicillins Rash 09/01/2015  . Sulfa antibiotics Rash 06/02/2014    Review of Systems:    All systems reviewed and negative except where noted in HPI.   Physical Exam:  LMP 07/02/2017 (Approximate)  Patient's last menstrual period was 07/02/2017 (approximate). Psych:  Alert and cooperative. Normal mood and affect. General:   Alert,  Well-developed, well-nourished, pleasant and cooperative in NAD Head:  Normocephalic and atraumatic. Eyes:  Sclera clear, no icterus.   Conjunctiva pink. Ears:  Normal auditory acuity. Nose:  No deformity, discharge, or lesions. Mouth:  No deformity or lesions,oropharynx pink & moist. Neck:  Supple; no masses or thyromegaly. Lungs:  Respirations even and unlabored.  Clear throughout to auscultation.   No wheezes, crackles, or rhonchi. No  acute distress. Heart:  Regular rate and rhythm; no murmurs, clicks, rubs, or gallops. Abdomen:  Normal bowel sounds.  No bruits.  Soft, mild epigastric tenderness and non-distended without masses, hepatosplenomegaly or hernias noted.  No guarding or rebound tenderness.  Negative Carnett sign.   Rectal:  Deferred.  Msk:  Symmetrical without gross deformities.  Good, equal movement & strength bilaterally. Pulses:  Normal pulses noted. Extremities:  No clubbing or edema.  No cyanosis. Neurologic:  Alert and oriented x3;  grossly normal neurologically. Skin:  Intact without  significant lesions or rashes.  No jaundice. Lymph Nodes:  No significant cervical adenopathy. Psych:  Alert and cooperative. Normal mood and affect.  Imaging Studies: US Abdomen Limited Ruq  Result Date: 07/20/2017 CLINICAL DATA:  Epigastric abdominal pain for the past 2 days. History of a dilated common bile duct following cholecystectomy. EXAM: ULTRASOUND ABDOMEN LIMITED RIGHT UPPER QUADRANT COMPARISON:  11/06/2014 and abdomen and pelvis CT dated 10/29/2008. FINDINGS: Gallbladder: Surgically absent. Common bile duct: Diameter: 8.8 mm proximally, larger in the midportion of the duct, unchanged. Liver: No focal lesion identified. Within normal limits in parenchymal echogenicity. No intrahepatic biliary ductal dilatation. Portal vein is patent on color Doppler imaging with normal direction of blood flow towards the liver. IMPRESSION: 1. Stable post cholecystectomy common duct dilatation without intrahepatic dilatation. 2. No acute abnormality. Electronically Signed   By: Claudie Revering M.D.   On: 07/20/2017 08:07    Assessment and Plan:   Alexandria Orozco is a 31 y.o. y/o female who comes in with epigastric pain and tenderness to the epigastric area and left upper quadrant. The patient states her symptoms are slightly worse with food. There is no change in weight and she states that these pains a been there for the past week. The patient will be set up for a upper endoscopy to look for a cause of her epigastric discomfort and left upper quadrant pain. The patient is already on a PPI. She will continue this.I have discussed risks & benefits which include, but are not limited to, bleeding, infection, perforation & drug reaction.  The patient agrees with this plan & written consent will be obtained.      Lucilla Lame, MD. Marval Regal   Note: This dictation was prepared with Dragon dictation along with smaller phrase technology. Any transcriptional errors that result from this process are unintentional.

## 2017-07-28 NOTE — Progress Notes (Addendum)
Gastroenterology Consultation  Referring Provider:     Dr. Tressia Miners Primary Care Physician:  Patient, No Pcp Per Primary Gastroenterologist:  Dr. Allen Norris     Reason for Consultation:     Abdominal pain        HPI:   Alexandria Orozco is a 31 y.o. y/o female referred for consultation & management of abdominal pain by Dr. Patient, No Pcp Per.  This patient comes in today after being seen in the emergency room for abdominal pain. The patient had seen me in the past approximately 9 years ago for gallstone pancreatitis and had an ERCP done at that time with a stone extraction. The patient has had 3 episodes of pancreatitis in the past as reported by her. She also reports that she was seen in the past by Dr. Vira Agar and Dr. Candace Cruise. The patient reports that the pain is epigastric and stabbing. She also states that it goes into her back. The patient was in the ER on November 5 and her labs did not show any cause for abdominal pain. The patient also had an ultrasound that did not show any cause for her symptoms. The patient's lipase and LFTs were not elevated. She was in the hospital for a day and then discharged.  Past Medical History:  Diagnosis Date  . Migraines     Past Surgical History:  Procedure Laterality Date  . CHOLECYSTECTOMY    . TONSILLECTOMY      Prior to Admission medications   Medication Sig Start Date End Date Taking? Authorizing Provider  hyoscyamine (LEVSIN, ANASPAZ) 0.125 MG tablet Take 1 tablet (0.125 mg total) 3 (three) times daily by mouth. 07/21/17   Gladstone Lighter, MD  Norethindrone Acetate-Ethinyl Estrad-FE (LOMEDIA 24 FE) 1-20 MG-MCG(24) tablet Take 1 tablet by mouth daily. 12/03/16   Malachy Mood, MD  pantoprazole (PROTONIX) 40 MG tablet Take 1 tablet (40 mg total) 2 (two) times daily before a meal by mouth. 07/21/17   Gladstone Lighter, MD  traMADol (ULTRAM) 50 MG tablet Take 1 tablet (50 mg total) every 6 (six) hours as needed by mouth. 07/21/17 07/21/18   Gladstone Lighter, MD    Family History  Problem Relation Age of Onset  . Diabetes Mother   . Diabetes Paternal Uncle   . Diabetes Maternal Grandmother   . Pancreatitis Maternal Grandmother      Social History   Tobacco Use  . Smoking status: Never Smoker  . Smokeless tobacco: Never Used  Substance Use Topics  . Alcohol use: No  . Drug use: No    Allergies as of 07/28/2017 - Review Complete 07/20/2017  Allergen Reaction Noted  . Morphine and related Itching 09/01/2015  . Ampicillin Rash 06/02/2014  . Morphine Rash 10/01/2015  . Penicillins Rash 09/01/2015  . Sulfa antibiotics Rash 06/02/2014    Review of Systems:    All systems reviewed and negative except where noted in HPI.   Physical Exam:  LMP 07/02/2017 (Approximate)  Patient's last menstrual period was 07/02/2017 (approximate). Psych:  Alert and cooperative. Normal mood and affect. General:   Alert,  Well-developed, well-nourished, pleasant and cooperative in NAD Head:  Normocephalic and atraumatic. Eyes:  Sclera clear, no icterus.   Conjunctiva pink. Ears:  Normal auditory acuity. Nose:  No deformity, discharge, or lesions. Mouth:  No deformity or lesions,oropharynx pink & moist. Neck:  Supple; no masses or thyromegaly. Lungs:  Respirations even and unlabored.  Clear throughout to auscultation.   No wheezes, crackles, or rhonchi. No  acute distress. Heart:  Regular rate and rhythm; no murmurs, clicks, rubs, or gallops. Abdomen:  Normal bowel sounds.  No bruits.  Soft, mild epigastric tenderness and non-distended without masses, hepatosplenomegaly or hernias noted.  No guarding or rebound tenderness.  Negative Carnett sign.   Rectal:  Deferred.  Msk:  Symmetrical without gross deformities.  Good, equal movement & strength bilaterally. Pulses:  Normal pulses noted. Extremities:  No clubbing or edema.  No cyanosis. Neurologic:  Alert and oriented x3;  grossly normal neurologically. Skin:  Intact without  significant lesions or rashes.  No jaundice. Lymph Nodes:  No significant cervical adenopathy. Psych:  Alert and cooperative. Normal mood and affect.  Imaging Studies: US Abdomen Limited Ruq  Result Date: 07/20/2017 CLINICAL DATA:  Epigastric abdominal pain for the past 2 days. History of a dilated common bile duct following cholecystectomy. EXAM: ULTRASOUND ABDOMEN LIMITED RIGHT UPPER QUADRANT COMPARISON:  11/06/2014 and abdomen and pelvis CT dated 10/29/2008. FINDINGS: Gallbladder: Surgically absent. Common bile duct: Diameter: 8.8 mm proximally, larger in the midportion of the duct, unchanged. Liver: No focal lesion identified. Within normal limits in parenchymal echogenicity. No intrahepatic biliary ductal dilatation. Portal vein is patent on color Doppler imaging with normal direction of blood flow towards the liver. IMPRESSION: 1. Stable post cholecystectomy common duct dilatation without intrahepatic dilatation. 2. No acute abnormality. Electronically Signed   By: Claudie Revering M.D.   On: 07/20/2017 08:07    Assessment and Plan:   Alexandria Orozco is a 31 y.o. y/o female who comes in with epigastric pain and tenderness to the epigastric area and left upper quadrant. The patient states her symptoms are slightly worse with food. There is no change in weight and she states that these pains a been there for the past week. The patient will be set up for a upper endoscopy to look for a cause of her epigastric discomfort and left upper quadrant pain. The patient is already on a PPI. She will continue this.I have discussed risks & benefits which include, but are not limited to, bleeding, infection, perforation & drug reaction.  The patient agrees with this plan & written consent will be obtained.      Alexandria Lame, MD. Alexandria Orozco   Note: This dictation was prepared with Dragon dictation along with smaller phrase technology. Any transcriptional errors that result from this process are unintentional.

## 2017-08-01 ENCOUNTER — Telehealth: Payer: Self-pay | Admitting: Gastroenterology

## 2017-08-01 NOTE — Telephone Encounter (Signed)
Patient wants to know if we received FMLA paperwork for her colonoscopy? Please call.

## 2017-08-04 ENCOUNTER — Encounter: Payer: Self-pay | Admitting: *Deleted

## 2017-08-04 NOTE — Telephone Encounter (Signed)
Received FMLA paperwork from pt.

## 2017-08-04 NOTE — Telephone Encounter (Signed)
LVM for pt to return my call.  We have not received FMLA paperwork.

## 2017-08-05 NOTE — Discharge Instructions (Signed)
General Anesthesia, Adult, Care After °These instructions provide you with information about caring for yourself after your procedure. Your health care provider may also give you more specific instructions. Your treatment has been planned according to current medical practices, but problems sometimes occur. Call your health care provider if you have any problems or questions after your procedure. °What can I expect after the procedure? °After the procedure, it is common to have: °· Vomiting. °· A sore throat. °· Mental slowness. ° °It is common to feel: °· Nauseous. °· Cold or shivery. °· Sleepy. °· Tired. °· Sore or achy, even in parts of your body where you did not have surgery. ° °Follow these instructions at home: °For at least 24 hours after the procedure: °· Do not: °? Participate in activities where you could fall or become injured. °? Drive. °? Use heavy machinery. °? Drink alcohol. °? Take sleeping pills or medicines that cause drowsiness. °? Make important decisions or sign legal documents. °? Take care of children on your own. °· Rest. °Eating and drinking °· If you vomit, drink water, juice, or soup when you can drink without vomiting. °· Drink enough fluid to keep your urine clear or pale yellow. °· Make sure you have little or no nausea before eating solid foods. °· Follow the diet recommended by your health care provider. °General instructions °· Have a responsible adult stay with you until you are awake and alert. °· Return to your normal activities as told by your health care provider. Ask your health care provider what activities are safe for you. °· Take over-the-counter and prescription medicines only as told by your health care provider. °· If you smoke, do not smoke without supervision. °· Keep all follow-up visits as told by your health care provider. This is important. °Contact a health care provider if: °· You continue to have nausea or vomiting at home, and medicines are not helpful. °· You  cannot drink fluids or start eating again. °· You cannot urinate after 8-12 hours. °· You develop a skin rash. °· You have fever. °· You have increasing redness at the site of your procedure. °Get help right away if: °· You have difficulty breathing. °· You have chest pain. °· You have unexpected bleeding. °· You feel that you are having a life-threatening or urgent problem. °This information is not intended to replace advice given to you by your health care provider. Make sure you discuss any questions you have with your health care provider. °Document Released: 12/09/2000 Document Revised: 02/05/2016 Document Reviewed: 08/17/2015 °Elsevier Interactive Patient Education © 2018 [employer] ° °

## 2017-08-06 ENCOUNTER — Ambulatory Visit: Payer: 59 | Admitting: Anesthesiology

## 2017-08-06 ENCOUNTER — Encounter
Admission: RE | Disposition: A | Discharge status: Home / Self Care | Payer: Self-pay | Source: Ambulatory Visit | Attending: Gastroenterology

## 2017-08-06 ENCOUNTER — Ambulatory Visit
Admission: RE | Admit: 2017-08-06 | Discharge: 2017-08-06 | Disposition: A | Discharge status: Home / Self Care | Payer: 59 | Source: Ambulatory Visit | Attending: Gastroenterology | Admitting: Gastroenterology

## 2017-08-06 DIAGNOSIS — K2970 Gastritis, unspecified, without bleeding: Secondary | ICD-10-CM

## 2017-08-06 DIAGNOSIS — Z833 Family history of diabetes mellitus: Secondary | ICD-10-CM | POA: Diagnosis not present

## 2017-08-06 DIAGNOSIS — Z882 Allergy status to sulfonamides status: Secondary | ICD-10-CM | POA: Insufficient documentation

## 2017-08-06 DIAGNOSIS — R1013 Epigastric pain: Secondary | ICD-10-CM | POA: Diagnosis not present

## 2017-08-06 DIAGNOSIS — Z9049 Acquired absence of other specified parts of digestive tract: Secondary | ICD-10-CM | POA: Insufficient documentation

## 2017-08-06 DIAGNOSIS — Z8719 Personal history of other diseases of the digestive system: Secondary | ICD-10-CM | POA: Diagnosis not present

## 2017-08-06 DIAGNOSIS — Z885 Allergy status to narcotic agent status: Secondary | ICD-10-CM | POA: Insufficient documentation

## 2017-08-06 DIAGNOSIS — Z880 Allergy status to penicillin: Secondary | ICD-10-CM | POA: Insufficient documentation

## 2017-08-06 DIAGNOSIS — Z79899 Other long term (current) drug therapy: Secondary | ICD-10-CM | POA: Diagnosis not present

## 2017-08-06 DIAGNOSIS — Z8379 Family history of other diseases of the digestive system: Secondary | ICD-10-CM | POA: Insufficient documentation

## 2017-08-06 DIAGNOSIS — K2950 Unspecified chronic gastritis without bleeding: Secondary | ICD-10-CM | POA: Insufficient documentation

## 2017-08-06 HISTORY — PX: ESOPHAGOGASTRODUODENOSCOPY (EGD) WITH PROPOFOL: SHX5813

## 2017-08-06 SURGERY — ESOPHAGOGASTRODUODENOSCOPY (EGD) WITH PROPOFOL
Anesthesia: General | Wound class: Clean Contaminated

## 2017-08-06 MED ORDER — ACETAMINOPHEN 160 MG/5ML PO SOLN: 325.0000 mg | Freq: Once | ORAL | Status: DC

## 2017-08-06 MED ORDER — LIDOCAINE HCL (CARDIAC) 20 MG/ML IV SOLN
INTRAVENOUS | Status: DC | PRN
  Administered 2017-08-06: 40 mg via INTRAVENOUS

## 2017-08-06 MED ORDER — STERILE WATER FOR IRRIGATION IR SOLN
Status: DC | PRN
  Administered 2017-08-06: 09:00:00

## 2017-08-06 MED ORDER — ACETAMINOPHEN 325 MG PO TABS: 325.0000 mg | ORAL_TABLET | Freq: Once | ORAL | Status: DC

## 2017-08-06 MED ORDER — PROPOFOL 10 MG/ML IV BOLUS
INTRAVENOUS | Status: DC | PRN
  Administered 2017-08-06: 100 mg via INTRAVENOUS
  Administered 2017-08-06: 10 mg via INTRAVENOUS
  Administered 2017-08-06: 50 mg via INTRAVENOUS

## 2017-08-06 MED ORDER — LACTATED RINGERS IV SOLN
INTRAVENOUS | Status: DC
  Administered 2017-08-06: 09:00:00 via INTRAVENOUS

## 2017-08-06 MED ORDER — LACTATED RINGERS IV SOLN: 1000.0000 mL | INTRAVENOUS | Status: DC

## 2017-08-06 MED ORDER — GLYCOPYRROLATE 0.2 MG/ML IJ SOLN
INTRAMUSCULAR | Status: DC | PRN
  Administered 2017-08-06: 0.1 mg via INTRAVENOUS

## 2017-08-06 SURGICAL SUPPLY — 32 items
BALLN DILATOR 10-12 8 (BALLOONS)
BALLN DILATOR 12-15 8 (BALLOONS)
BALLN DILATOR 15-18 8 (BALLOONS)
BALLN DILATOR CRE 0-12 8 (BALLOONS)
BALLN DILATOR ESOPH 8 10 CRE (MISCELLANEOUS) IMPLANT
BALLOON DILATOR 12-15 8 (BALLOONS) IMPLANT
BALLOON DILATOR 15-18 8 (BALLOONS) IMPLANT
BALLOON DILATOR CRE 0-12 8 (BALLOONS) IMPLANT
BLOCK BITE 60FR ADLT L/F GRN (MISCELLANEOUS) ×3 IMPLANT
CANISTER SUCT 1200ML W/VALVE (MISCELLANEOUS) ×3 IMPLANT
CLIP HMST 235XBRD CATH ROT (MISCELLANEOUS) IMPLANT
CLIP RESOLUTION 360 11X235 (MISCELLANEOUS)
FCP ESCP3.2XJMB 240X2.8X (MISCELLANEOUS)
FORCEPS BIOP RAD 4 LRG CAP 4 (CUTTING FORCEPS) IMPLANT
FORCEPS BIOP RJ4 240 W/NDL (MISCELLANEOUS)
FORCEPS ESCP3.2XJMB 240X2.8X (MISCELLANEOUS) IMPLANT
GOWN CVR UNV OPN BCK APRN NK (MISCELLANEOUS) ×2 IMPLANT
GOWN ISOL THUMB LOOP REG UNIV (MISCELLANEOUS) ×4
INJECTOR VARIJECT VIN23 (MISCELLANEOUS) IMPLANT
KIT DEFENDO VALVE AND CONN (KITS) IMPLANT
KIT ENDO PROCEDURE OLY (KITS) ×3 IMPLANT
MARKER SPOT ENDO TATTOO 5ML (MISCELLANEOUS) IMPLANT
PAD GROUND ADULT SPLIT (MISCELLANEOUS) IMPLANT
RETRIEVER NET PLAT FOOD (MISCELLANEOUS) IMPLANT
SNARE SHORT THROW 13M SML OVAL (MISCELLANEOUS) ×3 IMPLANT
SNARE SHORT THROW 30M LRG OVAL (MISCELLANEOUS) IMPLANT
SPOT EX ENDOSCOPIC TATTOO (MISCELLANEOUS)
SYR INFLATION 60ML (SYRINGE) IMPLANT
TRAP ETRAP POLY (MISCELLANEOUS) IMPLANT
VARIJECT INJECTOR VIN23 (MISCELLANEOUS)
WATER STERILE IRR 250ML POUR (IV SOLUTION) ×3 IMPLANT
WIRE CRE 18-20MM 8CM F G (MISCELLANEOUS) IMPLANT

## 2017-08-06 NOTE — Anesthesia Postprocedure Evaluation (Signed)
Anesthesia Post Note  Patient: Alexandria Orozco  Procedure(s) Performed: ESOPHAGOGASTRODUODENOSCOPY (EGD) WITH PROPOFOL (N/A )  Patient location during evaluation: PACU Anesthesia Type: General Level of consciousness: awake and alert and oriented Pain management: satisfactory to patient Vital Signs Assessment: post-procedure vital signs reviewed and stable Respiratory status: spontaneous breathing, nonlabored ventilation and respiratory function stable Cardiovascular status: blood pressure returned to baseline and stable Postop Assessment: Adequate PO intake and No signs of nausea or vomiting Anesthetic complications: no    Raliegh Ip

## 2017-08-06 NOTE — Op Note (Signed)
[facility] Gastroenterology Patient Name: Alexandria Orozco Procedure Date: 08/06/2017 9:10 AM MRN: [id number] Account #: [id number] Date of Birth: 05-Mar-1986 Admit Type: Outpatient Age: 31 Room: [facility] OR ROOM 01 Gender: Female Note Status: Finalized Procedure:            Upper GI endoscopy Indications:          Epigastric abdominal pain Providers:            Lucilla Lame MD, MD Medicines:            Propofol per Anesthesia Complications:        No immediate complications. Procedure:            Pre-Anesthesia Assessment:                       - Prior to the procedure, a History and Physical was                        performed, and patient medications and allergies were                        reviewed. The patient's tolerance of previous                        anesthesia was also reviewed. The risks and benefits of                        the procedure and the sedation options and risks were                        discussed with the patient. All questions were                        answered, and informed consent was obtained. Prior                        Anticoagulants: The patient has taken no previous                        anticoagulant or antiplatelet agents. ASA Grade                        Assessment: II - A patient with mild systemic disease.                        After reviewing the risks and benefits, the patient was                        deemed in satisfactory condition to undergo the                        procedure.                       After obtaining informed consent, the endoscope was                        passed under direct vision. Throughout the procedure,                        the patient's blood pressure,  pulse, and oxygen                        saturations were monitored continuously. The Olympus                        GIF H180J Endoscope (S#: [id number]) was introduced                        through the mouth, and advanced to the second part  of                        duodenum. The upper GI endoscopy was accomplished                        without difficulty. The patient tolerated the procedure                        well. Findings:      The examined esophagus was normal.      Localized minimal inflammation characterized by erythema was found in       the gastric antrum. Biopsies were taken with a cold forceps for       histology.      The examined duodenum was normal. Impression:           - Normal esophagus.                       - Gastritis. Biopsied.                       - Normal examined duodenum. Recommendation:       - Discharge patient to home.                       - Resume previous diet.                       - Continue present medications.                       - Await pathology results. Procedure Code(s):    --- Professional ---                       [phone], Esophagogastroduodenoscopy, flexible, transoral;                        with biopsy, single or multiple Diagnosis Code(s):    --- Professional ---                       R10.13, Epigastric pain                       K29.70, Gastritis, unspecified, without bleeding CPT copyright 2016 American Medical Association. All rights reserved. The codes documented in this report are preliminary and upon coder review may  be revised to meet current compliance requirements. Lucilla Lame MD, MD 08/06/2017 9:27:39 AM This report has been signed electronically. Number of Addenda: 0 Note Initiated On: 08/06/2017 9:10 AM      [facility]

## 2017-08-06 NOTE — Transfer of Care (Signed)
Immediate Anesthesia Transfer of Care Note  Patient: Alexandria Orozco  Procedure(s) Performed: ESOPHAGOGASTRODUODENOSCOPY (EGD) WITH PROPOFOL (N/A )  Patient Location: PACU  Anesthesia Type: General  Level of Consciousness: awake, alert  and patient cooperative  Airway and Oxygen Therapy: Patient Spontanous Breathing and Patient connected to supplemental oxygen  Post-op Assessment: Post-op Vital signs reviewed, Patient's Cardiovascular Status Stable, Respiratory Function Stable, Patent Airway and No signs of Nausea or vomiting  Post-op Vital Signs: Reviewed and stable  Complications: No apparent anesthesia complications

## 2017-08-06 NOTE — Interval H&P Note (Signed)
History and Physical Interval Note:  08/06/2017 9:11 AM  Alexandria Orozco  has presented today for surgery, with the diagnosis of nausea and vomiting R11.2  The various methods of treatment have been discussed with the patient and family. After consideration of risks, benefits and other options for treatment, the patient has consented to  Procedure(s): ESOPHAGOGASTRODUODENOSCOPY (EGD) WITH PROPOFOL (N/A) as a surgical intervention .  The patient's history has been reviewed, patient examined, no change in status, stable for surgery.  I have reviewed the patient's chart and labs.  Questions were answered to the patient's satisfaction.     Chason Mciver [employer]

## 2017-08-06 NOTE — Anesthesia Procedure Notes (Signed)
Procedure Name: MAC Date/Time: 08/06/2017 9:16 AM Performed by: Janna Arch, CRNA Pre-anesthesia Checklist: Patient identified, Emergency Drugs available, Suction available and Patient being monitored Patient Re-evaluated:Patient Re-evaluated prior to induction Oxygen Delivery Method: Nasal cannula

## 2017-08-06 NOTE — Anesthesia Preprocedure Evaluation (Signed)
Anesthesia Evaluation  Patient identified by MRN, date of birth, ID band Patient awake    Reviewed: Allergy & Precautions, H&P , NPO status , Patient's Chart, lab work & pertinent test results  Airway Mallampati: II  TM Distance: >3 FB Neck ROM: full    Dental no notable dental hx.    Pulmonary    Pulmonary exam normal breath sounds clear to auscultation       Cardiovascular Normal cardiovascular exam Rhythm:regular Rate:Normal     Neuro/Psych  Headaches,    GI/Hepatic   Endo/Other    Renal/GU      Musculoskeletal   Abdominal   Peds  Hematology   Anesthesia Other Findings   Reproductive/Obstetrics                             Anesthesia Physical Anesthesia Plan  ASA: II  Anesthesia Plan: General   Post-op Pain Management:    Induction: Intravenous  PONV Risk Score and Plan: 3 and Propofol infusion  Airway Management Planned: Natural Airway  Additional Equipment:   Intra-op Plan:   Post-operative Plan:   Informed Consent: I have reviewed the patients History and Physical, chart, labs and discussed the procedure including the risks, benefits and alternatives for the proposed anesthesia with the patient or authorized representative who has indicated his/her understanding and acceptance.     Plan Discussed with: CRNA  Anesthesia Plan Comments:         Anesthesia Quick Evaluation

## 2017-08-08 ENCOUNTER — Encounter: Payer: Self-pay | Admitting: Gastroenterology

## 2017-08-11 LAB — SURGICAL PATHOLOGY

## 2017-08-14 ENCOUNTER — Telehealth: Payer: Self-pay | Admitting: Gastroenterology

## 2017-08-14 NOTE — Telephone Encounter (Signed)
Left vm letting pt know the paperwork has been faxed.

## 2017-08-14 NOTE — Telephone Encounter (Signed)
Patient left a voice message that [facility] stated they haven't received her paperwork yet. Please call patient [phone]

## 2017-08-16 ENCOUNTER — Encounter: Payer: Self-pay | Admitting: Gastroenterology

## 2017-09-16 NOTE — L&D Delivery Note (Signed)
     Delivery Note   Alexandria Orozco is a 32 y.o. [id number] at [redacted]w[redacted]d Estimated Date of Delivery: 05/11/18  PRE-OPERATIVE DIAGNOSIS:  1) [redacted]w[redacted]d pregnancy.    POST-OPERATIVE DIAGNOSIS:  1) [redacted]w[redacted]d pregnancy s/p Vaginal, Spontaneous    Delivery Type: Vaginal, Spontaneous    Delivery Anesthesia: Epidural   Labor Complications:   Preterm labor    ESTIMATED BLOOD LOSS: 150  ml    FINDINGS:   1) female infant, Apgar scores of 8    at 1 minute and 8    at 5 minutes and a birthweight of 103.7  ounces.    2) Nuchal cord: no   SPECIMENS:   PLACENTA:   Appearance: Intact , 3 vessel cord, cord blood sample collected   Removal: Spontaneous      Disposition:   Held per protocol   DISPOSITION:  Infant to left in stable condition in the delivery room, with L&D personnel and mother,  NARRATIVE SUMMARY: Labor course:  [name] is a P2T6244 at [redacted]w[redacted]d who presented for Preterm labor.  She progressed well in labor with pitocin.  She received the appropriate epidural anesthesia and proceeded to complete dilation. She evidenced good maternal expulsive effort during the second stage.She went on to deliver a viable female infant "Colten". The placenta delivered without problems and was noted to be complete. A perineal and vaginal examination was performed . Lacerations: None  The patient tolerated this well.  Philip Aspen, CNM  04/06/2018 7:45 PM

## 2017-09-19 ENCOUNTER — Encounter: Payer: Self-pay | Admitting: Obstetrics and Gynecology

## 2017-09-19 ENCOUNTER — Ambulatory Visit: Payer: Managed Care, Other (non HMO) | Admitting: Obstetrics and Gynecology

## 2017-09-19 ENCOUNTER — Other Ambulatory Visit: Payer: Self-pay | Admitting: Obstetrics and Gynecology

## 2017-09-19 ENCOUNTER — Encounter: Payer: Self-pay | Admitting: Advanced Practice Midwife

## 2017-09-19 ENCOUNTER — Ambulatory Visit (INDEPENDENT_AMBULATORY_CARE_PROVIDER_SITE_OTHER): Payer: Managed Care, Other (non HMO)

## 2017-09-19 ENCOUNTER — Ambulatory Visit (INDEPENDENT_AMBULATORY_CARE_PROVIDER_SITE_OTHER): Payer: Managed Care, Other (non HMO) | Admitting: Obstetrics and Gynecology

## 2017-09-19 VITALS — BP 106/75 | HR 73 | Ht 60.0 in | Wt 153.2 lb

## 2017-09-19 DIAGNOSIS — N926 Irregular menstruation, unspecified: Secondary | ICD-10-CM | POA: Diagnosis not present

## 2017-09-19 LAB — POCT URINE PREGNANCY: Preg Test, Ur: POSITIVE — AB

## 2017-09-19 NOTE — Progress Notes (Signed)
Subjective:     Patient ID: Alexandria Orozco, female   DOB: 18-Jun-1986, 32 y.o.   MRN: [id number]  HPI Here for pregnancy confirmation. Unsure LMP but thinks it is around middle of November. G3P2. Happy about unplanned pregnancy. Some mild nausea.  Review of Systems Negative     Objective:   Physical Exam A&O x4 Well groomed female in no distress Blood pressure 106/75, pulse 73, height 5' (1.524 m), weight 153 lb 3.2 oz (69.5 kg), last menstrual period 08/04/2017.    ULTRASOUND Findings:  Nelda Marseille intrauterine pregnancy is visualized with a CRL consistent with 6 4/[redacted] weeks gestation, giving an (U/S) EDD of 05/11/18. No clinical EDD has been established.  FHR: 126 BPM CRL measurement: 7.4 mm Yolk sac and and early anatomy is normal.  Right Ovary measures 3.6 x 3.6 x 2.3 cm and appears WNL. Left Ovary measures 2.1 x 1.7 x 1.1 cm and appears WNL.  There is evidence of a corpus luteal cyst in the right ovary. Survey of the adnexa demonstrates no adnexal masses. There is no free peritoneal fluid in the cul de sac.  Assessment:     6 week intrauterine pregnancy    Plan:     RTC in 2 weeks for nurse intake & labs RTC in 5 weeks for new OB PE with me.  Melody Shambley,CNM

## 2017-09-19 NOTE — Patient Instructions (Signed)
First Trimester of Pregnancy The first trimester of pregnancy is from week 1 until the end of week 13 (months 1 through 3). During this time, your baby will begin to develop inside you. At 6-8 weeks, the eyes and face are formed, and the heartbeat can be seen on ultrasound. At the end of 12 weeks, all the baby's organs are formed. Prenatal care is all the medical care you receive before the birth of your baby. Make sure you get good prenatal care and follow all of your doctor's instructions. Follow these instructions at home: Medicines  Take over-the-counter and prescription medicines only as told by your doctor. Some medicines are safe and some medicines are not safe during pregnancy.  Take a prenatal vitamin that contains at least 600 micrograms (mcg) of folic acid.  If you have trouble pooping (constipation), take medicine that will make your stool soft (stool softener) if your doctor approves. Eating and drinking  Eat regular, healthy meals.  Your doctor will tell you the amount of weight gain that is right for you.  Avoid raw meat and uncooked cheese.  If you feel sick to your stomach (nauseous) or throw up (vomit): ? Eat 4 or 5 small meals a day instead of 3 large meals. ? Try eating a few soda crackers. ? Drink liquids between meals instead of during meals.  To prevent constipation: ? Eat foods that are high in fiber, like fresh fruits and vegetables, whole grains, and beans. ? Drink enough fluids to keep your pee (urine) clear or pale yellow. Activity  Exercise only as told by your doctor. Stop exercising if you have cramps or pain in your lower belly (abdomen) or low back.  Do not exercise if it is too hot, too humid, or if you are in a place of great height (high altitude).  Try to avoid standing for long periods of time. Move your legs often if you must stand in one place for a long time.  Avoid heavy lifting.  Wear low-heeled shoes. Sit and stand up straight.  You  can have sex unless your doctor tells you not to. Relieving pain and discomfort  Wear a good support bra if your breasts are sore.  Take warm water baths (sitz baths) to soothe pain or discomfort caused by hemorrhoids. Use hemorrhoid cream if your doctor says it is okay.  Rest with your legs raised if you have leg cramps or low back pain.  If you have puffy, bulging veins (varicose veins) in your legs: ? Wear support hose or compression stockings as told by your doctor. ? Raise (elevate) your feet for 15 minutes, 3-4 times a day. ? Limit salt in your food. Prenatal care  Schedule your prenatal visits by the twelfth week of pregnancy.  Write down your questions. Take them to your prenatal visits.  Keep all your prenatal visits as told by your doctor. This is important. Safety  Wear your seat belt at all times when driving.  Make a list of emergency phone numbers. The list should include numbers for family, friends, the hospital, and police and fire departments. General instructions  Ask your doctor for a referral to a local prenatal class. Begin classes no later than at the start of month 6 of your pregnancy.  Ask for help if you need counseling or if you need help with nutrition. Your doctor can give you advice or tell you where to go for help.  Do not use hot tubs, steam rooms, or   saunas.  Do not douche or use tampons or scented sanitary pads.  Do not cross your legs for long periods of time.  Avoid all herbs and alcohol. Avoid drugs that are not approved by your doctor.  Do not use any tobacco products, including cigarettes, chewing tobacco, and electronic cigarettes. If you need help quitting, ask your doctor. You may get counseling or other support to help you quit.  Avoid cat litter boxes and soil used by cats. These carry germs that can cause birth defects in the baby and can cause a loss of your baby (miscarriage) or stillbirth.  Visit your dentist. At home, brush  your teeth with a soft toothbrush. Be gentle when you floss. Contact a doctor if:  You are dizzy.  You have mild cramps or pressure in your lower belly.  You have a nagging pain in your belly area.  You continue to feel sick to your stomach, you throw up, or you have watery poop (diarrhea).  You have a bad smelling fluid coming from your vagina.  You have pain when you pee (urinate).  You have increased puffiness (swelling) in your face, hands, legs, or ankles. Get help right away if:  You have a fever.  You are leaking fluid from your vagina.  You have spotting or bleeding from your vagina.  You have very bad belly cramping or pain.  You gain or lose weight rapidly.  You throw up blood. It may look like coffee grounds.  You are around people who have German measles, fifth disease, or chickenpox.  You have a very bad headache.  You have shortness of breath.  You have any kind of trauma, such as from a fall or a car accident. Summary  The first trimester of pregnancy is from week 1 until the end of week 13 (months 1 through 3).  To take care of yourself and your unborn baby, you will need to eat healthy meals, take medicines only if your doctor tells you to do so, and do activities that are safe for you and your baby.  Keep all follow-up visits as told by your doctor. This is important as your doctor will have to ensure that your baby is healthy and growing well. This information is not intended to replace advice given to you by your health care provider. Make sure you discuss any questions you have with your health care provider. Document Released: 02/19/2008 Document Revised: 09/10/2016 Document Reviewed: 09/10/2016 Elsevier Interactive Patient Education  2017 Else[employer] Inc[employer]  

## 2017-09-29 ENCOUNTER — Ambulatory Visit (INDEPENDENT_AMBULATORY_CARE_PROVIDER_SITE_OTHER): Payer: Managed Care, Other (non HMO) | Admitting: Obstetrics and Gynecology

## 2017-09-29 VITALS — BP 113/65 | HR 66 | Wt 155.1 lb

## 2017-09-29 DIAGNOSIS — Z3481 Encounter for supervision of other normal pregnancy, first trimester: Secondary | ICD-10-CM

## 2017-09-29 LAB — OB RESULTS CONSOLE VARICELLA ZOSTER ANTIBODY, IGG: Varicella: IMMUNE

## 2017-09-29 NOTE — Progress Notes (Signed)
Alexandria Orozco presents for [facility] nurse interview visit. Pregnancy confirmation done 09/19/17 MS______.  G-3 .  P-1 1 0 2    . Pregnancy education material explained and given. __0_ cats in the home. NOB labs ordered. (TSH/HbgA1c due to Increased BMI), (sickle cell). HIV labs and Drug screen were explained optional and she did not decline. Drug screen ordered. PNV encouraged. Genetic screening options discussed. Genetic testing: Ordered/Declined/Unsure.  Pt may discuss with provider. Pt. To follow up with provider in _3_ weeks for NOB physical.  All questions answered.

## 2017-09-30 LAB — HEPATITIS B SURFACE ANTIGEN: HEP B S AG: NEGATIVE

## 2017-09-30 LAB — URINALYSIS, ROUTINE W REFLEX MICROSCOPIC
BILIRUBIN UA: NEGATIVE
Glucose, UA: NEGATIVE
Ketones, UA: NEGATIVE
Nitrite, UA: NEGATIVE
PH UA: 5.5 (ref 5.0–7.5)
PROTEIN UA: NEGATIVE
RBC UA: NEGATIVE
Specific Gravity, UA: 1.028 (ref 1.005–1.030)
Urobilinogen, Ur: 0.2 mg/dL (ref 0.2–1.0)

## 2017-09-30 LAB — ABO AND RH: Rh Factor: POSITIVE

## 2017-09-30 LAB — CBC WITH DIFFERENTIAL
BASOS ABS: 0 10*3/uL (ref 0.0–0.2)
Basos: 0 %
EOS (ABSOLUTE): 0 10*3/uL (ref 0.0–0.4)
Eos: 1 %
HEMOGLOBIN: 13 g/dL (ref 11.1–15.9)
Hematocrit: 38 % (ref 34.0–46.6)
IMMATURE GRANS (ABS): 0 10*3/uL (ref 0.0–0.1)
IMMATURE GRANULOCYTES: 1 %
LYMPHS ABS: 2.9 10*3/uL (ref 0.7–3.1)
LYMPHS: 34 %
MCH: 29.8 pg (ref 26.6–33.0)
MCHC: 34.2 g/dL (ref 31.5–35.7)
MCV: 87 fL (ref 79–97)
MONOCYTES: 7 %
Monocytes Absolute: 0.6 10*3/uL (ref 0.1–0.9)
NEUTROS PCT: 57 %
Neutrophils Absolute: 4.9 10*3/uL (ref 1.4–7.0)
RBC: 4.36 x10E6/uL (ref 3.77–5.28)
RDW: 13.4 % (ref 12.3–15.4)
WBC: 8.5 10*3/uL (ref 3.4–10.8)

## 2017-09-30 LAB — ANTIBODY SCREEN: ANTIBODY SCREEN: NEGATIVE

## 2017-09-30 LAB — MICROSCOPIC EXAMINATION
Casts: NONE SEEN /lpf
RBC, UA: NONE SEEN /hpf (ref 0–?)

## 2017-09-30 LAB — VARICELLA ZOSTER ANTIBODY, IGG: Varicella zoster IgG: 1158 index (ref >165)

## 2017-09-30 LAB — HIV ANTIBODY (ROUTINE TESTING W REFLEX): HIV SCREEN 4TH GENERATION: NONREACTIVE

## 2017-09-30 LAB — RPR: RPR Ser Ql: NONREACTIVE

## 2017-09-30 LAB — RUBELLA SCREEN: Rubella Antibodies, IGG: 7.06 index (ref >0.99)

## 2017-10-01 LAB — URINE CULTURE: ORGANISM ID, BACTERIA: NO GROWTH

## 2017-10-01 LAB — GC/CHLAMYDIA PROBE AMP
Chlamydia trachomatis, NAA: NEGATIVE
NEISSERIA GONORRHOEAE BY PCR: NEGATIVE

## 2017-10-06 LAB — MONITOR DRUG PROFILE 14(MW)
AMPHETAMINE SCREEN URINE: NEGATIVE ng/mL
BENZODIAZEPINE SCREEN, URINE: NEGATIVE ng/mL
Buprenorphine, Urine: NEGATIVE ng/mL
CANNABINOIDS UR QL SCN: NEGATIVE ng/mL
Cocaine (Metab) Scrn, Ur: NEGATIVE ng/mL
Creatinine(Crt), U: 264.1 mg/dL (ref 20.0–300.0)
Fentanyl, Urine: NEGATIVE pg/mL
Meperidine Screen, Urine: NEGATIVE ng/mL
Methadone Screen, Urine: NEGATIVE ng/mL
OXYCODONE+OXYMORPHONE UR QL SCN: NEGATIVE ng/mL
Opiate Scrn, Ur: NEGATIVE ng/mL
PH UR, DRUG SCRN: 5.5 (ref 4.5–8.9)
Phencyclidine Qn, Ur: NEGATIVE ng/mL
Propoxyphene Scrn, Ur: NEGATIVE ng/mL
SPECIFIC GRAVITY: 1.03
Tramadol Screen, Urine: NEGATIVE ng/mL

## 2017-10-06 LAB — NICOTINE SCREEN, URINE: COTININE UR QL SCN: NEGATIVE ng/mL

## 2017-10-06 LAB — BARBITURATE (GC/MS), URINE: BARBITURATES: NEGATIVE

## 2017-10-24 ENCOUNTER — Ambulatory Visit (INDEPENDENT_AMBULATORY_CARE_PROVIDER_SITE_OTHER): Payer: Managed Care, Other (non HMO) | Admitting: Certified Nurse Midwife

## 2017-10-24 ENCOUNTER — Encounter: Payer: Self-pay | Admitting: Certified Nurse Midwife

## 2017-10-24 VITALS — BP 103/68 | HR 74 | Wt 153.4 lb

## 2017-10-24 DIAGNOSIS — Z3481 Encounter for supervision of other normal pregnancy, first trimester: Secondary | ICD-10-CM

## 2017-10-24 NOTE — Progress Notes (Signed)
NEW OB HISTORY AND PHYSICAL  SUBJECTIVE:       Alexandria Orozco is a 32 y.o. [phone] female, Patient's last menstrual period was 08/04/2017., Estimated Date of Delivery: 05/11/18, [redacted]w[redacted]d, presents today for establishment of Prenatal Care. She has no unusual complaints.    Gynecologic History Patient's last menstrual period was 08/04/2017. Normal Contraception: none Last Pap: 2017 Results were: normal  Obstetric History OB History  Gravida Para Term Preterm AB Living  3 2 1 1   2   SAB TAB Ectopic Multiple Live Births          2    # Outcome Date GA Lbr Len/2nd Weight Sex Delivery Anes PTL Lv  3 Current           2 Term 2017   6 lb 9 oz (2.977 kg) F Vag-Spont  N LIV  1 Preterm 2008   5 lb 9 oz (2.523 kg) M Vag-Spont  Y LIV      Past Medical History:  Diagnosis Date  . Migraines     Past Surgical History:  Procedure Laterality Date  . CHOLECYSTECTOMY    . ESOPHAGOGASTRODUODENOSCOPY (EGD) WITH PROPOFOL N/A 08/06/2017   Procedure: ESOPHAGOGASTRODUODENOSCOPY (EGD) WITH PROPOFOL;  Surgeon: Lucilla Lame, MD;  Location: [facility];  Service: Endoscopy;  Laterality: N/A;  . TONSILLECTOMY      Current Outpatient Medications on File Prior to Visit  Medication Sig Dispense Refill  . Butalbital-APAP-Caffeine (FIORICET) 50-300-40 MG CAPS Take daily as needed by mouth.    . Prenatal Vit-Fe Fumarate-FA (PRENATAL MULTIVITAMIN) TABS tablet Take 1 tablet by mouth daily at 12 noon.     No current facility-administered medications on file prior to visit.     Allergies  Allergen Reactions  . Morphine And Related Itching  . Ampicillin Rash  . Morphine Rash  . Penicillins Rash  . Sulfa Antibiotics Rash    Social History   Socioeconomic History  . Marital status: Married    Spouse name: Not on file  . Number of children: Not on file  . Years of education: Not on file  . Highest education level: Not on file  Social Needs  . Financial resource strain: Not on file  . Food  insecurity - worry: Not on file  . Food insecurity - inability: Not on file  . Transportation needs - medical: Not on file  . Transportation needs - non-medical: Not on file  Occupational History  . Not on file  Tobacco Use  . Smoking status: Never Smoker  . Smokeless tobacco: Never Used  Substance and Sexual Activity  . Alcohol use: No  . Drug use: No  . Sexual activity: Yes    Birth control/protection: None  Other Topics Concern  . Not on file  Social History Narrative   Lives at home, independent    Family History  Problem Relation Age of Onset  . Diabetes Mother   . Diabetes Paternal Uncle   . Diabetes Maternal Grandmother   . Pancreatitis Maternal Grandmother     The following portions of the patient's history were reviewed and updated as appropriate: allergies, current medications, past OB history, past medical history, past surgical history, past family history, past social history, and problem list.    OBJECTIVE: Initial Physical Exam (New OB)  GENERAL APPEARANCE: alert, well appearing, in no apparent distress, oriented to person, place and time HEAD: normocephalic, atraumatic MOUTH: mucous membranes moist, pharynx normal without lesions THYROID: no thyromegaly or masses present BREASTS: no  masses noted, no significant tenderness, no palpable axillary nodes, no skin changes LUNGS: clear to auscultation, no wheezes, rales or rhonchi, symmetric air entry HEART: regular rate and rhythm, no murmurs ABDOMEN: soft, nontender, nondistended, no abnormal masses, no epigastric pain EXTREMITIES: no redness or tenderness in the calves or thighs SKIN: normal coloration and turgor, no rashes LYMPH NODES: no adenopathy palpable NEUROLOGIC: alert, oriented, normal speech, no focal findings or movement disorder noted  PELVIC EXAM EXTERNAL GENITALIA: normal appearing vulva with no masses, tenderness or lesions VAGINA: no abnormal discharge or lesions CERVIX: no lesions or  cervical motion tenderness UTERUS: gravid ADNEXA: no masses palpable and nontender OB EXAM PELVIMETRY: appears adequate RECTUM: exam not indicated  ASSESSMENT: Normal pregnancy  PLAN: New OB counseling: The patient has been given an overview regarding routine prenatal care. Recommendations regarding diet, weight gain, and exercise in pregnancy were given. Prenatal testing, optional genetic testing, and ultrasound use in pregnancy were reviewed. Maternit 21 today.  Benefits of Breast Feeding were discussed. The patient is encouraged to consider nursing her baby post partum.  Philip Aspen, CNM

## 2017-10-24 NOTE — Addendum Note (Signed)
Addended by: Raliegh Ip on: 10/24/2017 03:52 PM   Modules accepted: Orders

## 2017-10-24 NOTE — Patient Instructions (Signed)

## 2017-10-24 NOTE — Progress Notes (Signed)
Pt is here for a new OB physical.

## 2017-10-29 LAB — MATERNIT 21 PLUS CORE, BLOOD
Chromosome 13: NEGATIVE
Chromosome 18: NEGATIVE
Chromosome 21: NEGATIVE
Y Chromosome: DETECTED

## 2017-10-31 ENCOUNTER — Encounter: Payer: Self-pay | Admitting: Certified Nurse Midwife

## 2017-10-31 LAB — PAP IG, CT-NG, RFX HPV ASCU
Chlamydia, Nuc. Acid Amp: NEGATIVE
GONOCOCCUS BY NUCLEIC ACID AMP: NEGATIVE
PAP SMEAR COMMENT: 0

## 2017-11-03 ENCOUNTER — Encounter: Payer: Self-pay | Admitting: Certified Nurse Midwife

## 2017-11-21 ENCOUNTER — Encounter: Payer: Managed Care, Other (non HMO) | Admitting: Certified Nurse Midwife

## 2017-11-21 ENCOUNTER — Encounter: Payer: Self-pay | Admitting: Certified Nurse Midwife

## 2017-11-21 ENCOUNTER — Ambulatory Visit (INDEPENDENT_AMBULATORY_CARE_PROVIDER_SITE_OTHER): Payer: Managed Care, Other (non HMO) | Admitting: Certified Nurse Midwife

## 2017-11-21 VITALS — BP 98/60 | HR 80 | Wt 152.2 lb

## 2017-11-21 DIAGNOSIS — Z3482 Encounter for supervision of other normal pregnancy, second trimester: Secondary | ICD-10-CM

## 2017-11-21 DIAGNOSIS — Z8751 Personal history of pre-term labor: Secondary | ICD-10-CM

## 2017-11-21 DIAGNOSIS — R87613 High grade squamous intraepithelial lesion on cytologic smear of cervix (HGSIL): Secondary | ICD-10-CM

## 2017-11-21 LAB — POCT URINALYSIS DIPSTICK
Bilirubin, UA: NEGATIVE
Blood, UA: NEGATIVE
Glucose, UA: NEGATIVE
NITRITE UA: NEGATIVE
Odor: NEGATIVE
PH UA: 7 (ref 5.0–8.0)
PROTEIN UA: NEGATIVE
Spec Grav, UA: 1.015 (ref 1.010–1.025)
UROBILINOGEN UA: 0.2 U/dL

## 2017-11-21 MED ORDER — AZITHROMYCIN 250 MG PO TABS: ORAL_TABLET | ORAL | 0 refills | Status: DC

## 2017-11-21 NOTE — Progress Notes (Signed)
ROB- declines flu today- recently sick. C/o of sinus pressure. Unsure if she has had fevers.

## 2017-11-21 NOTE — Progress Notes (Signed)
ROB-Doing well, reports sinusitis for last few weeks. Rx: Zpak, see orders. Will scheduled colposcopy with MD in 2 weeks. History of PTL/PTB, declines 17P injections. Reviewed red flag symptoms and when to call. RTC x 2 weeks for colposcopy with MD. RTC x 4 weeks for Anatomy scan and ROB with Midwife or sooner if needed.

## 2017-11-21 NOTE — Patient Instructions (Signed)
Round Ligament Pain The round ligament is a cord of muscle and tissue that helps to support the uterus. It can become a source of pain during pregnancy if it becomes stretched or twisted as the baby grows. The pain usually begins in the second trimester of pregnancy, and it can come and go until the baby is delivered. It is not a serious problem, and it does not cause harm to the baby. Round ligament pain is usually a short, sharp, and pinching pain, but it can also be a dull, lingering, and aching pain. The pain is felt in the lower side of the abdomen or in the groin. It usually starts deep in the groin and moves up to the outside of the hip area. Pain can occur with:  A sudden change in position.  Rolling over in bed.  Coughing or sneezing.  Physical activity.  Follow these instructions at home: Watch your condition for any changes. Take these steps to help with your pain:  When the pain starts, relax. Then try: ? Sitting down. ? Flexing your knees up to your abdomen. ? Lying on your side with one pillow under your abdomen and another pillow between your legs. ? Sitting in a warm bath for 15-20 minutes or until the pain goes away.  Take over-the-counter and prescription medicines only as told by your health care provider.  Move slowly when you sit and stand.  Avoid long walks if they cause pain.  Stop or lessen your physical activities if they cause pain.  Contact a health care provider if:  Your pain does not go away with treatment.  You feel pain in your back that you did not have before.  Your medicine is not helping. Get help right away if:  You develop a fever or chills.  You develop uterine contractions.  You develop vaginal bleeding.  You develop nausea or vomiting.  You develop diarrhea.  You have pain when you urinate. This information is not intended to replace advice given to you by your health care provider. Make sure you discuss any questions you have  with your health care provider. Document Released: 06/11/2008 Document Revised: 02/08/2016 Document Reviewed: 11/09/2014 Elsevier Interactive Patient Education  2018 [employer]. Back Pain in Pregnancy Back pain during pregnancy is common. Back pain may be caused by several factors that are related to changes during your pregnancy. Follow these instructions at home: Managing pain, stiffness, and swelling  If directed, apply ice for sudden (acute) back pain. ? Put ice in a plastic bag. ? Place a towel between your skin and the bag. ? Leave the ice on for 20 minutes, 2-3 times per day.  If directed, apply heat to the affected area before you exercise: ? Place a towel between your skin and the heat pack or heating pad. ? Leave the heat on for 20-30 minutes. ? Remove the heat if your skin turns bright red. This is especially important if you are unable to feel pain, heat, or cold. You may have a greater risk of getting burned. Activity  Exercise as told by your health care provider. Exercising is the best way to prevent or manage back pain.  Listen to your body when lifting. If lifting hurts, ask for help or bend your knees. This uses your leg muscles instead of your back muscles.  Squat down when picking up something from the floor. Do not bend over.  Only use bed rest as told by your health care provider. Bed  rest should only be used for the most severe episodes of back pain. Standing, Sitting, and Lying Down  Do not stand in one place for long periods of time.  Use good posture when sitting. Make sure your head rests over your shoulders and is not hanging forward. Use a pillow on your lower back if necessary.  Try sleeping on your side, preferably the left side, with a pillow or two between your legs. If you are sore after a night's rest, your bed may be too soft. A firm mattress may provide more support for your back during pregnancy. General instructions  Do not wear high  heels.  Eat a healthy diet. Try to gain weight within your health care provider's recommendations.  Use a maternity girdle, elastic sling, or back brace as told by your health care provider.  Take over-the-counter and prescription medicines only as told by your health care provider.  Keep all follow-up visits as told by your health care provider. This is important. This includes any visits with any specialists, such as a physical therapist. Contact a health care provider if:  Your back pain interferes with your daily activities.  You have increasing pain in other parts of your body. Get help right away if:  You develop numbness, tingling, weakness, or problems with the use of your arms or legs.  You develop severe back pain that is not controlled with medicine.  You have a sudden change in bowel or bladder control.  You develop shortness of breath, dizziness, or you faint.  You develop nausea, vomiting, or sweating.  You have back pain that is a rhythmic, cramping pain similar to labor pains. Labor pain is usually 1-2 minutes apart, lasts for about 1 minute, and involves a bearing down feeling or pressure in your pelvis.  You have back pain and your water breaks or you have vaginal bleeding.  You have back pain or numbness that travels down your leg.  Your back pain developed after you fell.  You develop pain on one side of your back.  You see blood in your urine.  You develop skin blisters in the area of your back pain. This information is not intended to replace advice given to you by your health care provider. Make sure you discuss any questions you have with your health care provider. Document Released: 12/11/2005 Document Revised: 02/08/2016 Document Reviewed: 05/17/2015 Elsevier Interactive Patient Education  2018 [employer]. Common Medications Safe in Pregnancy  Acne:      Constipation:  Benzoyl Peroxide     Colace  Clindamycin      Dulcolax  Suppository  Topica Erythromycin     Fibercon  Salicylic Acid      Metamucil         Miralax AVOID:        Senakot   Accutane    Cough:  Retin-A       Cough Drops  Tetracycline      Phenergan w/ Codeine if Rx  Minocycline      Robitussin (Plain & DM)  Antibiotics:     Crabs/Lice:  Ceclor       RID  Cephalosporins    AVOID:  E-Mycins      Kwell  Keflex  Macrobid/Macrodantin   Diarrhea:  Penicillin      Kao-Pectate  Zithromax      Imodium AD         PUSH FLUIDS AVOID:       Cipro  Fever:  Tetracycline      Tylenol (Regular or Extra  Minocycline       Strength)  Levaquin      Extra Strength-Do not          Exceed 8 tabs/24 hrs Caffeine:        <264m/day (equiv. To 1 cup of coffee or  approx. 3 12 oz sodas)         Gas: Cold/Hayfever:       Gas-X  Benadryl      Mylicon  Claritin       Phazyme  **Claritin-D        Chlor-Trimeton    Headaches:  Dimetapp      ASA-Free Excedrin  Drixoral-Non-Drowsy     Cold Compress  Mucinex (Guaifenasin)     Tylenol (Regular or Extra  Sudafed/Sudafed-12 Hour     Strength)  **Sudafed PE Pseudoephedrine   Tylenol Cold & Sinus     Vicks Vapor Rub  Zyrtec  **AVOID if Problems With Blood Pressure         Heartburn: Avoid lying down for at least 1 hour after meals  Aciphex      Maalox     Rash:  Milk of Magnesia     Benadryl    Mylanta       1% Hydrocortisone Cream  Pepcid  Pepcid Complete   Sleep Aids:  Prevacid      Ambien   Prilosec       Benadryl  Rolaids       Chamomile Tea  Tums (Limit 4/day)     Unisom  Zantac       Tylenol PM         Warm milk-add vanilla or  Hemorrhoids:       Sugar for taste  Anusol/Anusol H.C.  (RX: Analapram 2.5%)  Sugar Substitutes:  Hydrocortisone OTC     Ok in moderation  Preparation H      Tucks        Vaseline lotion applied to tissue with wiping    Herpes:     Throat:  Acyclovir      Oragel  Famvir  Valtrex     Vaccines:         Flu Shot Leg  Cramps:       *Gardasil  Benadryl      Hepatitis A         Hepatitis B Nasal Spray:       Pneumovax  Saline Nasal Spray     Polio Booster         Tetanus Nausea:       Tuberculosis test or PPD  Vitamin B6 25 mg TID   AVOID:    Dramamine      *Gardasil  Emetrol       Live Poliovirus  Ginger Root 250 mg QID    MMR (measles, mumps &  High Complex Carbs @ Bedtime    rebella)  Sea Bands-Accupressure    Varicella (Chickenpox)  Unisom 1/2 tab TID     *No known complications           If received before Pain:         Known pregnancy;   Darvocet       Resume series after  Lortab        Delivery  Percocet    Yeast:   Tramadol      Femstat  Tylenol 3      Gyne-lotrimin  Ultram  Monistat  Vicodin           MISC:         All Sunscreens           Hair Coloring/highlights          Insect Repellant's          (Including DEET)         Mystic Tans Second Trimester of Pregnancy The second trimester is from week 13 through week 28, month 4 through 6. This is often the time in pregnancy that you feel your best. Often times, morning sickness has lessened or quit. You may have more energy, and you may get hungry more often. Your unborn baby (fetus) is growing rapidly. At the end of the sixth month, he or she is about 9 inches long and weighs about 1 pounds. You will likely feel the baby move (quickening) between 18 and 20 weeks of pregnancy. Follow these instructions at home:  Avoid all smoking, herbs, and alcohol. Avoid drugs not approved by your doctor.  Do not use any tobacco products, including cigarettes, chewing tobacco, and electronic cigarettes. If you need help quitting, ask your doctor. You may get counseling or other support to help you quit.  Only take medicine as told by your doctor. Some medicines are safe and some are not during pregnancy.  Exercise only as told by your doctor. Stop exercising if you start having cramps.  Eat regular, healthy meals.  Wear a good support bra  if your breasts are tender.  Do not use hot tubs, steam rooms, or saunas.  Wear your seat belt when driving.  Avoid raw meat, uncooked cheese, and liter boxes and soil used by cats.  Take your prenatal vitamins.  Take 1500-2000 milligrams of calcium daily starting at the 20th week of pregnancy until you deliver your baby.  Try taking medicine that helps you poop (stool softener) as needed, and if your doctor approves. Eat more fiber by eating fresh fruit, vegetables, and whole grains. Drink enough fluids to keep your pee (urine) clear or pale yellow.  Take warm water baths (sitz baths) to soothe pain or discomfort caused by hemorrhoids. Use hemorrhoid cream if your doctor approves.  If you have puffy, bulging veins (varicose veins), wear support hose. Raise (elevate) your feet for 15 minutes, 3-4 times a day. Limit salt in your diet.  Avoid heavy lifting, wear low heals, and sit up straight.  Rest with your legs raised if you have leg cramps or low back pain.  Visit your dentist if you have not gone during your pregnancy. Use a soft toothbrush to brush your teeth. Be gentle when you floss.  You can have sex (intercourse) unless your doctor tells you not to.  Go to your doctor visits. Get help if:  You feel dizzy.  You have mild cramps or pressure in your lower belly (abdomen).  You have a nagging pain in your belly area.  You continue to feel sick to your stomach (nauseous), throw up (vomit), or have watery poop (diarrhea).  You have bad smelling fluid coming from your vagina.  You have pain with peeing (urination). Get help right away if:  You have a fever.  You are leaking fluid from your vagina.  You have spotting or bleeding from your vagina.  You have severe belly cramping or pain.  You lose or gain weight rapidly.  You have trouble catching your breath and have chest pain.  You notice sudden or  extreme puffiness (swelling) of your face, hands, ankles, feet,  or legs.  You have not felt the baby move in over an hour.  You have severe headaches that do not go away with medicine.  You have vision changes. This information is not intended to replace advice given to you by your health care provider. Make sure you discuss any questions you have with your health care provider. Document Released: 11/27/2009 Document Revised: 02/08/2016 Document Reviewed: 11/03/2012 Elsevier Interactive Patient Education  2017 [employer].

## 2017-11-24 DIAGNOSIS — Z8751 Personal history of pre-term labor: Secondary | ICD-10-CM | POA: Insufficient documentation

## 2017-11-24 DIAGNOSIS — R87613 High grade squamous intraepithelial lesion on cytologic smear of cervix (HGSIL): Secondary | ICD-10-CM | POA: Insufficient documentation

## 2017-11-24 NOTE — Assessment & Plan Note (Signed)
10/24/2017 

## 2017-11-27 ENCOUNTER — Encounter: Payer: Self-pay | Admitting: Certified Nurse Midwife

## 2017-12-09 ENCOUNTER — Encounter: Payer: Managed Care, Other (non HMO) | Admitting: Obstetrics and Gynecology

## 2017-12-22 ENCOUNTER — Other Ambulatory Visit: Payer: Self-pay | Admitting: Certified Nurse Midwife

## 2017-12-22 ENCOUNTER — Other Ambulatory Visit (INDEPENDENT_AMBULATORY_CARE_PROVIDER_SITE_OTHER): Payer: Managed Care, Other (non HMO)

## 2017-12-22 ENCOUNTER — Ambulatory Visit (INDEPENDENT_AMBULATORY_CARE_PROVIDER_SITE_OTHER): Payer: Managed Care, Other (non HMO) | Admitting: Certified Nurse Midwife

## 2017-12-22 VITALS — BP 95/63 | HR 71 | Wt 156.2 lb

## 2017-12-22 DIAGNOSIS — Z3689 Encounter for other specified antenatal screening: Secondary | ICD-10-CM

## 2017-12-22 DIAGNOSIS — Z3482 Encounter for supervision of other normal pregnancy, second trimester: Secondary | ICD-10-CM

## 2017-12-22 LAB — POCT URINALYSIS DIPSTICK
Bilirubin, UA: NEGATIVE
Glucose, UA: NEGATIVE
Ketones, UA: NEGATIVE
LEUKOCYTES UA: NEGATIVE
Nitrite, UA: NEGATIVE
Protein, UA: NEGATIVE
RBC UA: NEGATIVE
Spec Grav, UA: 1.01 (ref 1.010–1.025)
UROBILINOGEN UA: 0.2 U/dL
pH, UA: 8 (ref 5.0–8.0)

## 2017-12-22 NOTE — Progress Notes (Signed)
ROB, doing well. Anatomy U/s today -incomplete (see below). Feels good movement. Reviewed round ligament pain and sciatic pain. Return in 2 wks for follow u/s. Follow up in 4 wks ROB.   Philip Aspen, CNM   ULTRASOUND REPORT  Location: [facility] Date of Service:  12/22/2017  Indications: Anatomy Findings:  Singleton intrauterine pregnancy is visualized with FHR at 150 BPM. Biometrics give an (U/S) Gestational age of [redacted] weeks and an (U/S) EDD of 05/11/18; this correlates with the clinically established EDD of 05/11/18.  Fetal presentation is breech.  EFW: 329 grams (0lb12oz). Placenta: Anterior and grade 1. AFI: WNL subjectively.  Anatomic survey is incomplete.  Views of the fetal heart and profile are needed to complete survey.  Visualized anatomy appears WNL; Gender - Female.   Right Ovary measures 3.2 x 2.8 x 1.4 cm. It is normal in appearance. Left Ovary measures 2.6 x 1.8 x 1.2 cm. It is normal appearance. There is no obvious evidence of a corpus luteal cyst. Survey of the adnexa demonstrates no adnexal masses. There is no free peritoneal fluid in the cul de sac.  Impression: 1. 20 week Viable Singleton Intrauterine pregnancy by U/S. 2. (U/S) EDD is consistent with Clinically established (LMP) EDD of 05/11/18. 3. Incomplete anatomy scan - Views of the fetal heart and profile are needed to complete exam.  Recommendations: 1.Clinical correlation with the patient's History and Physical Exam. 2. F/U U/S in 2 weeks to complete anatomical survey.  Dario Ave, RDMS

## 2017-12-22 NOTE — Progress Notes (Signed)
Pt is here for an ROB visit. 

## 2017-12-24 ENCOUNTER — Other Ambulatory Visit: Payer: Self-pay | Admitting: Certified Nurse Midwife

## 2017-12-24 DIAGNOSIS — IMO0002 Reserved for concepts with insufficient information to code with codable children: Secondary | ICD-10-CM

## 2017-12-24 DIAGNOSIS — Z0489 Encounter for examination and observation for other specified reasons: Secondary | ICD-10-CM

## 2018-01-05 ENCOUNTER — Ambulatory Visit (INDEPENDENT_AMBULATORY_CARE_PROVIDER_SITE_OTHER): Payer: Managed Care, Other (non HMO)

## 2018-01-05 DIAGNOSIS — IMO0002 Reserved for concepts with insufficient information to code with codable children: Secondary | ICD-10-CM

## 2018-01-05 DIAGNOSIS — Z0489 Encounter for examination and observation for other specified reasons: Secondary | ICD-10-CM | POA: Diagnosis not present

## 2018-01-20 ENCOUNTER — Ambulatory Visit (INDEPENDENT_AMBULATORY_CARE_PROVIDER_SITE_OTHER): Payer: Managed Care, Other (non HMO) | Admitting: Obstetrics and Gynecology

## 2018-01-20 VITALS — BP 107/68 | HR 77 | Wt 159.1 lb

## 2018-01-20 DIAGNOSIS — Z3492 Encounter for supervision of normal pregnancy, unspecified, second trimester: Secondary | ICD-10-CM

## 2018-01-20 LAB — POCT URINALYSIS DIPSTICK
Bilirubin, UA: NEGATIVE
GLUCOSE UA: NEGATIVE
KETONES UA: NEGATIVE
LEUKOCYTES UA: NEGATIVE
NITRITE UA: NEGATIVE
Protein, UA: NEGATIVE
RBC UA: NEGATIVE
SPEC GRAV UA: 1.01 (ref 1.010–1.025)
Urobilinogen, UA: 0.2 E.U./dL
pH, UA: 7 (ref 5.0–8.0)

## 2018-01-20 NOTE — Progress Notes (Signed)
ROB- pt is doing well 

## 2018-01-20 NOTE — Progress Notes (Signed)
ROB-doing well, discussed rescheduling colpo.

## 2018-02-02 ENCOUNTER — Encounter: Payer: Managed Care, Other (non HMO) | Admitting: Obstetrics and Gynecology

## 2018-02-05 ENCOUNTER — Encounter: Payer: Managed Care, Other (non HMO) | Admitting: Obstetrics and Gynecology

## 2018-02-19 ENCOUNTER — Encounter: Payer: Self-pay | Admitting: Certified Nurse Midwife

## 2018-02-19 ENCOUNTER — Other Ambulatory Visit: Payer: Self-pay

## 2018-02-19 DIAGNOSIS — Z3483 Encounter for supervision of other normal pregnancy, third trimester: Secondary | ICD-10-CM

## 2018-02-20 ENCOUNTER — Telehealth: Payer: Self-pay

## 2018-02-20 ENCOUNTER — Other Ambulatory Visit: Payer: Self-pay | Admitting: Certified Nurse Midwife

## 2018-02-20 ENCOUNTER — Encounter: Payer: Self-pay | Admitting: Certified Nurse Midwife

## 2018-02-20 ENCOUNTER — Other Ambulatory Visit: Payer: Self-pay

## 2018-02-20 DIAGNOSIS — Z3483 Encounter for supervision of other normal pregnancy, third trimester: Secondary | ICD-10-CM

## 2018-02-20 NOTE — Telephone Encounter (Signed)
Pt requested a change of appointment from 02/23/18 to 02/24/18. Appointment made for 02/24/18 @ 2:45. Mychart message sent.

## 2018-02-20 NOTE — Telephone Encounter (Signed)
Pt called office and was very rude to receptionist and this Probation officer. Lab orders per pt request- were entered numerous times. Pt states the lab was unable to access the orders.Orders were printed and faxed to [phone] with a confirmation of receipt. Pt states she works there and she is the one that does this and "this is what I was trying to tell you yesterday". Pt was informed the orders were faxed and she stated " I'm going to look to see if they are here before I hang up. Yes they are here." I thanked her and the call was ended.

## 2018-02-21 LAB — CBC
Hematocrit: 35.3 % (ref 34.0–46.6)
Hemoglobin: 11.8 g/dL (ref 11.1–15.9)
MCH: 31.4 pg (ref 26.6–33.0)
MCHC: 33.4 g/dL (ref 31.5–35.7)
MCV: 94 fL (ref 79–97)
PLATELETS: 305 10*3/uL (ref 150–450)
RBC: 3.76 x10E6/uL — ABNORMAL LOW (ref 3.77–5.28)
RDW: 13.2 % (ref 12.3–15.4)
WBC: 10.1 10*3/uL (ref 3.4–10.8)

## 2018-02-21 LAB — RPR: RPR Ser Ql: NONREACTIVE

## 2018-02-21 LAB — GLUCOSE, 1 HOUR GESTATIONAL: Gestational Diabetes Screen: 140 mg/dL — ABNORMAL HIGH (ref 65–139)

## 2018-02-23 ENCOUNTER — Encounter: Payer: Self-pay | Admitting: Certified Nurse Midwife

## 2018-02-23 ENCOUNTER — Other Ambulatory Visit: Payer: Managed Care, Other (non HMO)

## 2018-02-23 ENCOUNTER — Encounter: Payer: Managed Care, Other (non HMO) | Admitting: Certified Nurse Midwife

## 2018-02-24 ENCOUNTER — Ambulatory Visit (INDEPENDENT_AMBULATORY_CARE_PROVIDER_SITE_OTHER): Payer: Managed Care, Other (non HMO) | Admitting: Certified Nurse Midwife

## 2018-02-24 VITALS — BP 112/63 | HR 84 | Wt 163.4 lb

## 2018-02-24 DIAGNOSIS — Z3483 Encounter for supervision of other normal pregnancy, third trimester: Secondary | ICD-10-CM

## 2018-02-24 LAB — POCT URINALYSIS DIPSTICK
Bilirubin, UA: NEGATIVE
GLUCOSE UA: NEGATIVE
KETONES UA: NEGATIVE
LEUKOCYTES UA: NEGATIVE
NITRITE UA: NEGATIVE
PROTEIN UA: POSITIVE — AB
RBC UA: NEGATIVE
SPEC GRAV UA: 1.01 (ref 1.010–1.025)
Urobilinogen, UA: 0.2 E.U./dL
pH, UA: 7.5 (ref 5.0–8.0)

## 2018-02-24 MED ORDER — TETANUS-DIPHTH-ACELL PERTUSSIS 5-2.5-18.5 LF-MCG/0.5 IM SUSP
0.5000 mL | Freq: Once | INTRAMUSCULAR | Status: AC
  Administered 2018-02-24: 0.5 mL via INTRAMUSCULAR

## 2018-02-24 NOTE — Progress Notes (Signed)
ROB,doing well. Feels good movement. Discussed glucose results. She is currently tracking BS x 1 wk as discussed with Sharyn Lull. So far her fasting has been normal and 2 hr PP normal. Discussed cord blood donation ( private & public). Tdap & BTC today. She will follow up 2 wks.   Philip Aspen, CNM

## 2018-02-24 NOTE — Progress Notes (Signed)
Pt is here for an ROB visit. 

## 2018-02-24 NOTE — Patient Instructions (Signed)

## 2018-03-03 ENCOUNTER — Encounter: Payer: Self-pay | Admitting: Certified Nurse Midwife

## 2018-03-10 ENCOUNTER — Ambulatory Visit (INDEPENDENT_AMBULATORY_CARE_PROVIDER_SITE_OTHER): Payer: Managed Care, Other (non HMO) | Admitting: Certified Nurse Midwife

## 2018-03-10 VITALS — BP 106/65 | HR 83 | Wt 162.5 lb

## 2018-03-10 DIAGNOSIS — Z3483 Encounter for supervision of other normal pregnancy, third trimester: Secondary | ICD-10-CM

## 2018-03-10 LAB — POCT URINALYSIS DIPSTICK
Bilirubin, UA: NEGATIVE
Blood, UA: NEGATIVE
Glucose, UA: NEGATIVE
KETONES UA: NEGATIVE
Leukocytes, UA: NEGATIVE
Nitrite, UA: NEGATIVE
PH UA: 7.5 (ref 5.0–8.0)
Protein, UA: POSITIVE — AB
SPEC GRAV UA: 1.01 (ref 1.010–1.025)
UROBILINOGEN UA: 0.2 U/dL

## 2018-03-10 NOTE — Progress Notes (Signed)
Pt is here for an [place] visit.

## 2018-03-10 NOTE — Progress Notes (Signed)
ROB doing well. No complaints feels good movement. PT state she sent the log of her BS that she kept for the week to [place]. Instructed that patient that she did not need to continue monitoring given that she had all normal values x 1 wks. She verbalizes understanding Follow up 2 wks   Philip Aspen, CNM

## 2018-03-10 NOTE — Patient Instructions (Signed)

## 2018-03-25 ENCOUNTER — Ambulatory Visit (INDEPENDENT_AMBULATORY_CARE_PROVIDER_SITE_OTHER): Payer: Managed Care, Other (non HMO) | Admitting: Obstetrics and Gynecology

## 2018-03-25 VITALS — BP 110/66 | HR 84 | Wt 164.5 lb

## 2018-03-25 DIAGNOSIS — Z3493 Encounter for supervision of normal pregnancy, unspecified, third trimester: Secondary | ICD-10-CM | POA: Diagnosis not present

## 2018-03-25 LAB — POCT URINALYSIS DIPSTICK
Bilirubin, UA: NEGATIVE
Blood, UA: NEGATIVE
Glucose, UA: NEGATIVE
Ketones, UA: 5
LEUKOCYTES UA: NEGATIVE
NITRITE UA: NEGATIVE
PROTEIN UA: NEGATIVE
SPEC GRAV UA: 1.015 (ref 1.010–1.025)
Urobilinogen, UA: 0.2 E.U./dL
pH, UA: 6 (ref 5.0–8.0)

## 2018-03-25 NOTE — Progress Notes (Signed)
ROB- pt is doing well 

## 2018-03-25 NOTE — Progress Notes (Signed)
ROB- doing well, PTL s/s discussed.

## 2018-04-05 ENCOUNTER — Inpatient Hospital Stay
Admission: EM | Admit: 2018-04-05 | Discharge: 2018-04-08 | DRG: 807 | Disposition: A | Discharge status: Home / Self Care | Payer: Managed Care, Other (non HMO) | Attending: Certified Nurse Midwife | Admitting: Certified Nurse Midwife

## 2018-04-05 ENCOUNTER — Other Ambulatory Visit: Payer: Self-pay

## 2018-04-05 DIAGNOSIS — Z880 Allergy status to penicillin: Secondary | ICD-10-CM

## 2018-04-05 DIAGNOSIS — Z3A35 35 weeks gestation of pregnancy: Secondary | ICD-10-CM

## 2018-04-05 LAB — FETAL FIBRONECTIN: FETAL FIBRONECTIN: POSITIVE — AB

## 2018-04-05 LAB — RUPTURE OF MEMBRANE (ROM)PLUS: Rom Plus: NEGATIVE

## 2018-04-05 MED ORDER — BUTORPHANOL TARTRATE 2 MG/ML IJ SOLN
1.0000 mg | Freq: Once | INTRAMUSCULAR | Status: AC
  Administered 2018-04-05: 1 mg via INTRAVENOUS
  Filled 2018-04-05: qty 1

## 2018-04-05 MED ORDER — DIPHENHYDRAMINE HCL 50 MG/ML IJ SOLN: 12.5000 mg | Freq: Once | INTRAMUSCULAR | Status: DC

## 2018-04-05 MED ORDER — BETAMETHASONE SOD PHOS & ACET 6 (3-3) MG/ML IJ SUSP
12.0000 mg | INTRAMUSCULAR | Status: DC
  Administered 2018-04-05: 12 mg via INTRAMUSCULAR
  Filled 2018-04-05: qty 2

## 2018-04-05 MED ORDER — BETAMETHASONE SOD PHOS & ACET 6 (3-3) MG/ML IJ SUSP
INTRAMUSCULAR | Status: AC
  Filled 2018-04-05: qty 5

## 2018-04-05 MED ORDER — LACTATED RINGERS IV SOLN
500.0000 mL | INTRAVENOUS | Status: DC | PRN
  Administered 2018-04-05: 500 mL via INTRAVENOUS

## 2018-04-05 MED ORDER — LACTATED RINGERS IV SOLN
INTRAVENOUS | Status: DC
  Administered 2018-04-06 (×2): 999 mL via INTRAVENOUS

## 2018-04-05 MED ORDER — TERBUTALINE SULFATE 1 MG/ML IJ SOLN
0.2500 mg | Freq: Once | INTRAMUSCULAR | Status: DC
  Filled 2018-04-05: qty 1

## 2018-04-05 NOTE — Progress Notes (Signed)
Pt refuses Terbutaline dose at present time

## 2018-04-05 NOTE — Progress Notes (Signed)
GBS culture obtained and sent to lab per MD order and betamethasone given per MD order in left buttocks

## 2018-04-05 NOTE — OB Triage Note (Signed)
Pt G3P1102 [redacted]w[redacted]d complains of LOF since 1730 this afternoon. Pt states that it is clear. Pt states she has been cramping in her abdomen since then. Pt states + FM. Monitors applied and assessing. VSS.

## 2018-04-05 NOTE — Progress Notes (Signed)
Pt refuses procardia dose at present time. Pt educated by CNM of importance of procardia and terbutaline and pt verbalized understanding. CNM aware and notified of situation.

## 2018-04-05 NOTE — Progress Notes (Signed)
  L&D OB Triage Note  SUBJECTIVE Alexandria Orozco is a 32 y.o. [phone] female at [redacted]w[redacted]d, EDD Estimated Date of Delivery: 05/11/18 who presented to triage with complaints of leaking of fluid and contraction. She denies vaginal bleeding and feels good fetal movement. She has a history of preterm delivery @ 36 wks.  OB History  Gravida Para Term Preterm AB Living  3 2 1 1  0 2  SAB TAB Ectopic Multiple Live Births  0 0 0 0 2    # Outcome Date GA Lbr Len/2nd Weight Sex Delivery Anes PTL Lv  3 Current           2 Term 2017   6 lb 9 oz (2.977 kg) F Vag-Spont  N LIV  1 Preterm 2008   5 lb 9 oz (2.523 kg) M Vag-Spont  Y LIV    Medications Prior to Admission  Medication Sig Dispense Refill Last Dose  . Prenatal Vit-Fe Fumarate-FA (PRENATAL MULTIVITAMIN) TABS tablet Take 1 tablet by mouth daily at 12 noon.   Past Month at Unknown time  . Butalbital-APAP-Caffeine (FIORICET) 50-300-40 MG CAPS Take daily as needed by mouth.   Not Taking at Unknown time     OBJECTIVE  Nursing Evaluation:   BP 112/72 (BP Location: Left Arm)   Pulse 94   Temp 98.5 F (36.9 C) (Oral)   Resp 14   Ht 5' 0.5" (1.537 m)   Wt 164 lb (74.4 kg)   LMP 08/04/2017   BMI 31.50 kg/m    Findings:   ROM plus-negative     FFN: positive     Preterm contractions   NST was performed and has been reviewed by me.  NST INTERPRETATION: Category I  Mode: External Baseline Rate (A): 141 bpm Variability: Moderate Accelerations: 15 x 15 Decelerations: None     Contraction Frequency (min): 1-3  ASSESSMENT Impression:  1.  Pregnancy:  [id number] at [redacted]w[redacted]d , EDD Estimated Date of Delivery: 05/11/18 2.  NST:  Category I 3.Preterm contractions PLAN Betamethasone q 24 hrs x 2. IV fluid hydration. GBS swab. U/a.  Pt refuses terbutaline and procardia for preterm contractions. IV pain medication. Discussed risks to delivering preterm and partner verbalizes understanding. SVE no change 4cm/70/-2. Will re evaluate for cervical change  in 1-2 hrs. Dr. Marcelline Mates consulted on plan of care.   Philip Aspen, CNM

## 2018-04-06 ENCOUNTER — Inpatient Hospital Stay: Payer: Managed Care, Other (non HMO) | Admitting: Anesthesiology

## 2018-04-06 DIAGNOSIS — Z880 Allergy status to penicillin: Secondary | ICD-10-CM | POA: Diagnosis not present

## 2018-04-06 DIAGNOSIS — Z3A35 35 weeks gestation of pregnancy: Secondary | ICD-10-CM | POA: Diagnosis not present

## 2018-04-06 LAB — TYPE AND SCREEN
ABO/RH(D): O POS
Antibody Screen: NEGATIVE

## 2018-04-06 LAB — URINALYSIS, ROUTINE W REFLEX MICROSCOPIC
Bacteria, UA: NONE SEEN
Bilirubin Urine: NEGATIVE
Glucose, UA: NEGATIVE mg/dL
Ketones, ur: 20 mg/dL — AB
Nitrite: NEGATIVE
Protein, ur: NEGATIVE mg/dL
Specific Gravity, Urine: 1.006 (ref 1.005–1.030)
pH: 6 (ref 5.0–8.0)

## 2018-04-06 LAB — CBC
HEMATOCRIT: 31.3 % — AB (ref 35.0–47.0)
Hemoglobin: 10.8 g/dL — ABNORMAL LOW (ref 12.0–16.0)
MCH: 30.4 pg (ref 26.0–34.0)
MCHC: 34.4 g/dL (ref 32.0–36.0)
MCV: 88.2 fL (ref 80.0–100.0)
Platelets: 298 10*3/uL (ref 150–440)
RBC: 3.55 MIL/uL — AB (ref 3.80–5.20)
RDW: 12.8 % (ref 11.5–14.5)
WBC: 14.9 10*3/uL — ABNORMAL HIGH (ref 3.6–11.0)

## 2018-04-06 LAB — MRSA PCR SCREENING: MRSA by PCR: NEGATIVE

## 2018-04-06 MED ORDER — ACETAMINOPHEN 325 MG PO TABS
ORAL_TABLET | ORAL | Status: AC
  Filled 2018-04-06: qty 2

## 2018-04-06 MED ORDER — OXYCODONE-ACETAMINOPHEN 5-325 MG PO TABS: 1.0000 | ORAL_TABLET | ORAL | Status: DC | PRN

## 2018-04-06 MED ORDER — CLINDAMYCIN PHOSPHATE 900 MG/50ML IV SOLN
900.0000 mg | Freq: Once | INTRAVENOUS | Status: AC
  Administered 2018-04-06: 900 mg via INTRAVENOUS
  Filled 2018-04-06: qty 50

## 2018-04-06 MED ORDER — OXYCODONE-ACETAMINOPHEN 5-325 MG PO TABS: 2.0000 | ORAL_TABLET | ORAL | Status: DC | PRN

## 2018-04-06 MED ORDER — LIDOCAINE HCL (PF) 1 % IJ SOLN
INTRAMUSCULAR | Status: AC
  Filled 2018-04-06: qty 30

## 2018-04-06 MED ORDER — PRENATAL MULTIVITAMIN CH
1.0000 | ORAL_TABLET | Freq: Every day | ORAL | Status: DC
  Administered 2018-04-07 – 2018-04-08 (×2): 1 via ORAL
  Filled 2018-04-06 (×2): qty 1

## 2018-04-06 MED ORDER — DIPHENHYDRAMINE HCL 50 MG/ML IJ SOLN
12.5000 mg | INTRAMUSCULAR | Status: DC | PRN
Start: 2018-04-06 — End: 2018-04-06

## 2018-04-06 MED ORDER — OXYTOCIN 10 UNIT/ML IJ SOLN
INTRAMUSCULAR | Status: AC
  Filled 2018-04-06: qty 2

## 2018-04-06 MED ORDER — OXYTOCIN 40 UNITS IN LACTATED RINGERS INFUSION - SIMPLE MED
2.5000 [IU]/h | INTRAVENOUS | Status: DC
  Administered 2018-04-06: 2.5 [IU]/h via INTRAVENOUS

## 2018-04-06 MED ORDER — PHENYLEPHRINE 40 MCG/ML (10ML) SYRINGE FOR IV PUSH (FOR BLOOD PRESSURE SUPPORT)
80.0000 ug | PREFILLED_SYRINGE | INTRAVENOUS | Status: DC | PRN
  Filled 2018-04-06: qty 5

## 2018-04-06 MED ORDER — ONDANSETRON HCL 4 MG/2ML IJ SOLN: 4.0000 mg | INTRAMUSCULAR | Status: DC | PRN

## 2018-04-06 MED ORDER — DIBUCAINE 1 % RE OINT: 1.0000 "application " | TOPICAL_OINTMENT | RECTAL | Status: DC | PRN

## 2018-04-06 MED ORDER — SENNOSIDES-DOCUSATE SODIUM 8.6-50 MG PO TABS
2.0000 | ORAL_TABLET | ORAL | Status: DC
  Administered 2018-04-06 – 2018-04-07 (×2): 2 via ORAL
  Filled 2018-04-06 (×2): qty 2

## 2018-04-06 MED ORDER — CALCIUM CARBONATE ANTACID 500 MG PO CHEW
CHEWABLE_TABLET | ORAL | Status: AC
  Filled 2018-04-06: qty 2

## 2018-04-06 MED ORDER — LACTATED RINGERS IV SOLN: 500.0000 mL | Freq: Once | INTRAVENOUS | Status: DC

## 2018-04-06 MED ORDER — ONDANSETRON HCL 4 MG PO TABS: 4.0000 mg | ORAL_TABLET | ORAL | Status: DC | PRN

## 2018-04-06 MED ORDER — WITCH HAZEL-GLYCERIN EX PADS: 1.0000 "application " | MEDICATED_PAD | CUTANEOUS | Status: DC | PRN

## 2018-04-06 MED ORDER — BETAMETHASONE SOD PHOS & ACET 6 (3-3) MG/ML IJ SUSP
INTRAMUSCULAR | Status: AC
  Filled 2018-04-06: qty 5

## 2018-04-06 MED ORDER — ACETAMINOPHEN 325 MG PO TABS
650.0000 mg | ORAL_TABLET | Freq: Four times a day (QID) | ORAL | Status: DC | PRN
  Administered 2018-04-06: 650 mg via ORAL

## 2018-04-06 MED ORDER — CALCIUM CARBONATE ANTACID 500 MG PO CHEW
2.0000 | CHEWABLE_TABLET | Freq: Once | ORAL | Status: AC
  Administered 2018-04-06: 400 mg via ORAL

## 2018-04-06 MED ORDER — EPHEDRINE 5 MG/ML INJ
10.0000 mg | INTRAVENOUS | Status: DC | PRN
  Administered 2018-04-06: 10 mg via INTRAVENOUS
  Filled 2018-04-06: qty 2

## 2018-04-06 MED ORDER — SIMETHICONE 80 MG PO CHEW: 80.0000 mg | CHEWABLE_TABLET | ORAL | Status: DC | PRN

## 2018-04-06 MED ORDER — BENZOCAINE-MENTHOL 20-0.5 % EX AERO: 1.0000 "application " | INHALATION_SPRAY | CUTANEOUS | Status: DC | PRN

## 2018-04-06 MED ORDER — LACTATED RINGERS IV SOLN: 500.0000 mL | INTRAVENOUS | Status: DC | PRN

## 2018-04-06 MED ORDER — OXYTOCIN 40 UNITS IN LACTATED RINGERS INFUSION - SIMPLE MED
1.0000 m[IU]/min | INTRAVENOUS | Status: DC
  Administered 2018-04-06: 2 m[IU]/min via INTRAVENOUS

## 2018-04-06 MED ORDER — MISOPROSTOL 200 MCG PO TABS
ORAL_TABLET | ORAL | Status: AC
  Filled 2018-04-06: qty 4

## 2018-04-06 MED ORDER — BUPIVACAINE HCL (PF) 0.25 % IJ SOLN
INTRAMUSCULAR | Status: DC | PRN
  Administered 2018-04-06: 3 mL via EPIDURAL
  Administered 2018-04-06: 5 mL via EPIDURAL

## 2018-04-06 MED ORDER — LIDOCAINE-EPINEPHRINE (PF) 1.5 %-1:200000 IJ SOLN
INTRAMUSCULAR | Status: DC | PRN
  Administered 2018-04-06: 4 mL via EPIDURAL

## 2018-04-06 MED ORDER — OXYTOCIN 40 UNITS IN LACTATED RINGERS INFUSION - SIMPLE MED
INTRAVENOUS | Status: AC
  Filled 2018-04-06: qty 1000

## 2018-04-06 MED ORDER — CLINDAMYCIN PHOSPHATE 900 MG/50ML IV SOLN
900.0000 mg | Freq: Three times a day (TID) | INTRAVENOUS | Status: DC
  Administered 2018-04-06: 900 mg via INTRAVENOUS
  Filled 2018-04-06 (×4): qty 50

## 2018-04-06 MED ORDER — OXYTOCIN BOLUS FROM INFUSION
500.0000 mL | Freq: Once | INTRAVENOUS | Status: AC
  Administered 2018-04-06: 500 mL via INTRAVENOUS

## 2018-04-06 MED ORDER — FENTANYL 2.5 MCG/ML W/ROPIVACAINE 0.15% IN NS 100 ML EPIDURAL (ARMC)
EPIDURAL | Status: AC
  Filled 2018-04-06: qty 100

## 2018-04-06 MED ORDER — METHYLERGONOVINE MALEATE 0.2 MG/ML IJ SOLN: 0.2000 mg | INTRAMUSCULAR | Status: DC | PRN

## 2018-04-06 MED ORDER — DOCUSATE SODIUM 100 MG PO CAPS
100.0000 mg | ORAL_CAPSULE | Freq: Two times a day (BID) | ORAL | Status: DC
  Administered 2018-04-07 – 2018-04-08 (×3): 100 mg via ORAL
  Filled 2018-04-06 (×4): qty 1

## 2018-04-06 MED ORDER — TERBUTALINE SULFATE 1 MG/ML IJ SOLN: 0.2500 mg | Freq: Once | INTRAMUSCULAR | Status: DC | PRN

## 2018-04-06 MED ORDER — FERROUS SULFATE 325 (65 FE) MG PO TABS
325.0000 mg | ORAL_TABLET | Freq: Every day | ORAL | Status: DC
  Administered 2018-04-07 – 2018-04-08 (×2): 325 mg via ORAL
  Filled 2018-04-06 (×2): qty 1

## 2018-04-06 MED ORDER — IBUPROFEN 600 MG PO TABS
600.0000 mg | ORAL_TABLET | Freq: Four times a day (QID) | ORAL | Status: DC
  Administered 2018-04-06 – 2018-04-08 (×7): 600 mg via ORAL
  Filled 2018-04-06 (×7): qty 1

## 2018-04-06 MED ORDER — METHYLERGONOVINE MALEATE 0.2 MG PO TABS: 0.2000 mg | ORAL_TABLET | ORAL | Status: DC | PRN

## 2018-04-06 MED ORDER — COCONUT OIL OIL
1.0000 "application " | TOPICAL_OIL | Status: DC | PRN
  Administered 2018-04-07: 1 via TOPICAL
  Filled 2018-04-06: qty 120

## 2018-04-06 MED ORDER — ACETAMINOPHEN 325 MG PO TABS: 650.0000 mg | ORAL_TABLET | ORAL | Status: DC | PRN

## 2018-04-06 MED ORDER — FENTANYL 2.5 MCG/ML W/ROPIVACAINE 0.15% IN NS 100 ML EPIDURAL (ARMC)
12.0000 mL/h | EPIDURAL | Status: DC
  Administered 2018-04-06 (×2): 12 mL/h via EPIDURAL
  Filled 2018-04-06: qty 100

## 2018-04-06 MED ORDER — BUTORPHANOL TARTRATE 1 MG/ML IJ SOLN
1.0000 mg | INTRAMUSCULAR | Status: DC | PRN
Start: 2018-04-06 — End: 2018-04-06
  Administered 2018-04-06: 1 mg via INTRAVENOUS
  Filled 2018-04-06: qty 1

## 2018-04-06 MED ORDER — EPHEDRINE 5 MG/ML INJ
INTRAVENOUS | Status: AC
  Filled 2018-04-06: qty 4

## 2018-04-06 MED ORDER — DIPHENHYDRAMINE HCL 25 MG PO CAPS
25.0000 mg | ORAL_CAPSULE | Freq: Four times a day (QID) | ORAL | Status: DC | PRN
  Administered 2018-04-06: 25 mg via ORAL
  Filled 2018-04-06: qty 1

## 2018-04-06 MED ORDER — AMMONIA AROMATIC IN INHA
RESPIRATORY_TRACT | Status: AC
  Filled 2018-04-06: qty 10

## 2018-04-06 MED ORDER — LIDOCAINE HCL (PF) 1 % IJ SOLN
INTRAMUSCULAR | Status: DC | PRN
  Administered 2018-04-06: 3 mL via SUBCUTANEOUS

## 2018-04-06 NOTE — Anesthesia Preprocedure Evaluation (Signed)
Anesthesia Evaluation  Patient identified by MRN, date of birth, ID band Patient awake    Reviewed: Allergy & Precautions, H&P , NPO status , Patient's Chart, lab work & pertinent test results, reviewed documented beta blocker date and time   Airway Mallampati: II  TM Distance: >3 FB Neck ROM: full    Dental no notable dental hx. (+) Teeth Intact   Pulmonary neg pulmonary ROS, Current Smoker,    Pulmonary exam normal breath sounds clear to auscultation       Cardiovascular Exercise Tolerance: Good negative cardio ROS   Rhythm:regular Rate:Normal     Neuro/Psych  Headaches, negative neurological ROS  negative psych ROS   GI/Hepatic negative GI ROS, Neg liver ROS,   Endo/Other  negative endocrine ROSdiabetes  Renal/GU      Musculoskeletal   Abdominal   Peds  Hematology negative hematology ROS (+)   Anesthesia Other Findings   Reproductive/Obstetrics (+) Pregnancy                             Anesthesia Physical Anesthesia Plan  ASA: II  Anesthesia Plan: Epidural   Post-op Pain Management:    Induction:   PONV Risk Score and Plan:   Airway Management Planned:   Additional Equipment:   Intra-op Plan:   Post-operative Plan:   Informed Consent: I have reviewed the patients History and Physical, chart, labs and discussed the procedure including the risks, benefits and alternatives for the proposed anesthesia with the patient or authorized representative who has indicated his/her understanding and acceptance.       Plan Discussed with:   Anesthesia Plan Comments:         Anesthesia Quick Evaluation  

## 2018-04-06 NOTE — Anesthesia Procedure Notes (Signed)
Epidural Patient location during procedure: OB  Staffing Performed: anesthesiologist   Preanesthetic Checklist Completed: patient identified, site marked, surgical consent, pre-op evaluation, timeout performed, IV checked, risks and benefits discussed and monitors and equipment checked  Epidural Patient position: sitting Prep: Betadine Patient monitoring: heart rate, continuous pulse ox and blood pressure Approach: midline Location: L4-L5 Injection technique: LOR saline  Needle:  Needle type: Tuohy  Needle gauge: 17 G Needle length: 9 cm and 9 Needle insertion depth: 5 cm Catheter type: closed end flexible Catheter size: 19 Gauge Catheter at skin depth: 11 cm Test dose: negative and 1.5% lidocaine with Epi 1:200 K  Assessment Sensory level: T10 Events: blood not aspirated, injection not painful, no injection resistance, negative IV test and no paresthesia  Additional Notes   Patient tolerated the insertion well without complications.-SATD -IVTD. No paresthesia. Refer to [place] for VS and dosingReason for block:procedure for pain

## 2018-04-06 NOTE — Progress Notes (Signed)
LABOR NOTE   Alexandria Orozco 32 y.o.@ at [redacted]w[redacted]d Active phase labor.  SUBJECTIVE:  Comfortable with epidural  OBJECTIVE:  BP 104/69 (BP Location: Left Arm)   Pulse 90   Temp 98.2 F (36.8 C) (Oral)   Resp 16   Ht 5' 0.5" (1.537 m)   Wt 164 lb (74.4 kg)   LMP 08/04/2017   SpO2 99%   BMI 31.50 kg/m  Total I/O In: -  Out: 1000 [Urine:1000]  She has shown cervical change. CERVIX: Per RN exam SVE:   Dilation: 8 Effacement (%): 80 Station: Ballotable Exam by:: L Neese,RN CONTRACTIONS: irregular, every 5-6  minutes FHR: Fetal heart tracing reviewed. Baseline: 110 bpm, Variability: Good {> 6 bpm), Accelerations: Reactive, Decelerations: HR decreased to 99-105 x 6-8 minutes and Unable to determine if deceleration of change in baseline. Dr. Amalia Hailey consulted on strip Category II   Analgesia: Epidural  Labs: Lab Results  Component Value Date   WBC 14.9 (H) 04/05/2018   HGB 10.8 (L) 04/05/2018   HCT 31.3 (L) 04/05/2018   MCV 88.2 04/05/2018   PLT 298 04/05/2018    ASSESSMENT: 1) Labor curve reviewed.       Progress: Active phase labor.     Membranes: intact           2)preterm labor  Active Problems:   Labor and delivery, indication for care preterm labor  PLAN: continue present management, pt placed in high fowlers to facilitate engagement of fetal head.    Philip Aspen, CNM  04/06/2018 1:15 PM

## 2018-04-06 NOTE — Progress Notes (Signed)
LABOR NOTE   Alexandria Orozco 32 y.o.@ at [redacted]w[redacted]d Prolonged active phase labor.  SUBJECTIVE:  Pt comfortable with epidural . Feels a little pressure OBJECTIVE:  BP (!) 96/55   Pulse 100   Temp 97.6 F (36.4 C)   Resp 16   Ht 5' 0.5" (1.537 m)   Wt 164 lb (74.4 kg)   LMP 08/04/2017   SpO2 99%   BMI 31.50 kg/m  Total I/O In: -  Out: 1000 [Urine:1000]  She has shown cervical change. CERVIX: 8cm:  80%:   -2:   mid position:   soft SVE:   Dilation: 8 Effacement (%): 100 Station: -2 Exam by:: Grandville Silos, CNM CONTRACTIONS: irregular, every 5 minutes FHR: Fetal heart tracing reviewed. Baseline: 110 bpm, Variability: Good {> 6 bpm), Accelerations: Reactive and Decelerations: Absent Category II   Analgesia: Epidural  Labs: Lab Results  Component Value Date   WBC 14.9 (H) 04/05/2018   HGB 10.8 (L) 04/05/2018   HCT 31.3 (L) 04/05/2018   MCV 88.2 04/05/2018   PLT 298 04/05/2018    ASSESSMENT: 1) Labor curve reviewed.       Progress: Prolonged active phase labor.     Membranes: ruptured, clear fluid           Active Problems:   Labor and delivery, indication for care Preterm labor  PLAN: Discussed with pt augmentation with IV Pitocin given that contractions are 5 min apart and mild. Pt agrees to plan of care.   Philip Aspen, CNM  04/06/2018 4:47 PM

## 2018-04-06 NOTE — Progress Notes (Addendum)
LABOR NOTE   Len Childs 32 y.o.@ at [redacted]w[redacted]d preterm contractions  SUBJECTIVE:  Comfortable with epidural  OBJECTIVE:  BP 104/69 (BP Location: Left Arm)   Pulse 90   Temp 98.2 F (36.8 C) (Oral)   Resp 16   Ht 5' 0.5" (1.537 m)   Wt 164 lb (74.4 kg)   LMP 08/04/2017   SpO2 99%   BMI 31.50 kg/m  No intake/output data recorded.  She has not shown cervical change. CERVIX:  SVE:   Dilation: 6 Effacement (%): 70 Station: Ballotable Exam by:: B Nielsen,RN CONTRACTIONS: regular, every 5-9 minutes FHR: Fetal heart tracing reviewed. Baseline: 110 bpm, Variability: Good {> 6 bpm), Accelerations: Reactive and Decelerations: Absent Category I   Analgesia: Epidural  Labs: Lab Results  Component Value Date   WBC 14.9 (H) 04/05/2018   HGB 10.8 (L) 04/05/2018   HCT 31.3 (L) 04/05/2018   MCV 88.2 04/05/2018   PLT 298 04/05/2018    ASSESSMENT: 1) Labor curve reviewed.       Progress: Prolonged active phase labor.     Membranes: intact           2)  Preterm contractions  Active Problems:   Labor and delivery, indication for care   PLAN: continue present management   Philip Aspen, CNM  04/06/2018 8:57 AM

## 2018-04-06 NOTE — Progress Notes (Signed)
LABOR NOTE   Len Childs 32 y.o.@ at [redacted]w[redacted]d Prolonged active phase labor.  SUBJECTIVE:  Comfortable with epidural  OBJECTIVE:  BP (!) 96/55   Pulse 100   Temp 97.6 F (36.4 C)   Resp 16   Ht 5' 0.5" (1.537 m)   Wt 164 lb (74.4 kg)   LMP 08/04/2017   SpO2 99%   BMI 31.50 kg/m  Total I/O In: -  Out: 1000 [Urine:1000]  She has not shown cervical change. CERVIX: 8cm:  AROM-large amount of clear fluid.  SVE:   Dilation: 8 Effacement (%): 80 Station: Ballotable Exam by:: [employer] CONTRACTIONS: regular, every 6-9 minutes FHR: Fetal heart tracing reviewed. Baseline: 110 bpm, Variability: good with episodes of increased variability, Accelerations: Reactive and Decelerations: Absent Category II   Analgesia: Epidural  Labs: Lab Results  Component Value Date   WBC 14.9 (H) 04/05/2018   HGB 10.8 (L) 04/05/2018   HCT 31.3 (L) 04/05/2018   MCV 88.2 04/05/2018   PLT 298 04/05/2018    ASSESSMENT: 1) Labor curve reviewed.       Progress: Prolonged active phase labor.     Membranes: ruptured, clear fluid           2)  Preterm labor  Active Problems:   Labor and delivery, indication for care Preterm labor   PLAN: Discussed plan of care with consulting MD, Dr. Ellard Artis. Due to low baseline and episodes of increased variability it is recommend to expedite delivery.  Discussed plan with patient and her family . They verbalizes and agree to plan of care.   Philip Aspen, CNM  04/06/2018 3:16 PM

## 2018-04-06 NOTE — H&P (Signed)
History and Physical   HPI  Alexandria Orozco is a 32 y.o. [id number] at [redacted]w[redacted]d Estimated Date of Delivery: 05/11/18 who is being admitted for Preterm labor   OB History  OB History  Gravida Para Term Preterm AB Living  3 2 1 1  0 2  SAB TAB Ectopic Multiple Live Births  0 0 0 0 2    # Outcome Date GA Lbr Len/2nd Weight Sex Delivery Anes PTL Lv  3 Current           2 Term 2017   6 lb 9 oz (2.977 kg) F Vag-Spont  N LIV  1 Preterm 2008   5 lb 9 oz (2.523 kg) M Vag-Spont  Y LIV    PROBLEM LIST  Pregnancy complications or risks: Patient Active Problem List   Diagnosis Date Noted  . Labor and delivery, indication for care 04/05/2018  . History of preterm delivery 11/24/2017  . HSIL (high grade squamous intraepithelial lesion) on Pap smear of cervix 11/24/2017  . Abdominal pain, epigastric   . Gastritis without bleeding   . Abdominal pain 07/20/2017  . History of preterm labor 06/14/2016  . Syncope and collapse 04/10/2016     Prenatal labs and studies: ABO, Rh: O/Positive/-- (01/14 1448) Antibody: Negative (01/14 1448) Rubella: 7.06 (01/14 1448) RPR: Non Reactive (06/07 0938)  HBsAg: Negative (01/14 1448)  HIV: Non Reactive (01/14 1448)  GBS: pending   Past Medical History:  Diagnosis Date  . Migraines      Past Surgical History:  Procedure Laterality Date  . CHOLECYSTECTOMY    . ESOPHAGOGASTRODUODENOSCOPY (EGD) WITH PROPOFOL N/A 08/06/2017   Procedure: ESOPHAGOGASTRODUODENOSCOPY (EGD) WITH PROPOFOL;  Surgeon: Lucilla Lame, MD;  Location: [place];  Service: Endoscopy;  Laterality: N/A;  . TONSILLECTOMY       Medications    Current Discharge Medication List    CONTINUE these medications which have NOT CHANGED   Details  Prenatal Vit-Fe Fumarate-FA (PRENATAL MULTIVITAMIN) TABS tablet Take 1 tablet by mouth daily at 12 noon.    Butalbital-APAP-Caffeine (FIORICET) 50-300-40 MG CAPS Take daily as needed by mouth.         Allergies  Morphine  and related; Ampicillin; Morphine; Penicillins; and Sulfa antibiotics  Review of Systems  Constitutional: negative Eyes: negative Ears, nose, mouth, throat, and face: negative Respiratory: negative Cardiovascular: negative Gastrointestinal: negative Genitourinary:negative Integument/breast: negative Hematologic/lymphatic: negative Musculoskeletal:negative Neurological: negative Behavioral/Psych: negative Endocrine: negative Allergic/Immunologic: negative  Physical Exam  BP (!) 96/52 (BP Location: Right Arm)   Pulse 82   Temp 98.2 F (36.8 C) (Oral)   Resp 16   Ht 5' 0.5" (1.537 m)   Wt 164 lb (74.4 kg)   LMP 08/04/2017   SpO2 99%   BMI 31.50 kg/m   Lungs:  CTA B Cardio: RRR  Abd: Soft, gravid, NT Presentation: cephalic EXT: No C/C/ 1+ Edema  CERVIX: Dilation: 6 Effacement (%): 80 Cervical Position: Posterior Station: -2 Presentation: Vertex Exam by:: [name] RN   See Prenatal records for more detailed PE.     FHR:  Baseline: 110 bpm, Variability: Good {> 6 bpm), Accelerations: Reactive and Decelerations: Absent  Toco: Uterine Contractions: Frequency: Every 2-4 minutes, Duration: 50-80 seconds and Intensity: mild to moderate  Test Results  Results for orders placed or performed during the hospital encounter of 04/05/18 (from the past 24 hour(s))  Fetal fibronectin     Status: Abnormal   Collection Time: 04/05/18  9:06 PM  Result Value Ref Range  Fetal Fibronectin POSITIVE (A) NEGATIVE   Appearance, FETFIB CLEAR CLEAR  ROM Plus ([facility] only)     Status: None   Collection Time: 04/05/18  9:06 PM  Result Value Ref Range   Rom Plus NEGATIVE   Urinalysis, Routine w reflex microscopic     Status: Abnormal   Collection Time: 04/05/18  9:28 PM  Result Value Ref Range   Color, Urine YELLOW (A) YELLOW   APPearance HAZY (A) CLEAR   Specific Gravity, Urine 1.006 1.005 - 1.030   pH 6.0 5.0 - 8.0   Glucose, UA NEGATIVE NEGATIVE mg/dL   Hgb urine  dipstick LARGE (A) NEGATIVE   Bilirubin Urine NEGATIVE NEGATIVE   Ketones, ur 20 (A) NEGATIVE mg/dL   Protein, ur NEGATIVE NEGATIVE mg/dL   Nitrite NEGATIVE NEGATIVE   Leukocytes, UA MODERATE (A) NEGATIVE   RBC / HPF 0-5 0 - 5 RBC/hpf   WBC, UA 6-10 0 - 5 WBC/hpf   Bacteria, UA NONE SEEN NONE SEEN   Squamous Epithelial / LPF 0-5 0 - 5   Mucus PRESENT    Other: GBS unknown  Assessment   G3P1102 at [redacted]w[redacted]d Estimated Date of Delivery: 05/11/18  The fetus is reassuring.    Patient Active Problem List   Diagnosis Date Noted  . Labor and delivery, indication for care 04/05/2018  . History of preterm delivery 11/24/2017  . HSIL (high grade squamous intraepithelial lesion) on Pap smear of cervix 11/24/2017  . Abdominal pain, epigastric   . Gastritis without bleeding   . Abdominal pain 07/20/2017  . History of preterm labor 06/14/2016  . Syncope and collapse 04/10/2016    Plan  1. Admit to L&D :   continue present management, IV antibiotics GBS unknown 2. EFM:-- Category 1 3. Epidural if desired.  Stadol for IV pain until epidural requested. 4. Admission labs  5.Anticipated NSVD  Philip Aspen, CNM  04/06/2018 4:10 AM

## 2018-04-06 NOTE — Plan of Care (Signed)
Reviewed plan of care with patient. All questions answered. Will continue to monitor closely.

## 2018-04-06 NOTE — Progress Notes (Signed)
Antepartum Note   [place] 32 y.o.@ at [redacted]w[redacted]d with preterm contractions.   SUBJECTIVE:  More uncomfortable OBJECTIVE:  BP 112/72 (BP Location: Left Arm)   Pulse 94   Temp 98.5 F (36.9 C) (Oral)   Resp 14   Ht 5' 0.5" (1.537 m)   Wt 164 lb (74.4 kg)   LMP 08/04/2017   BMI 31.50 kg/m  No intake/output data recorded.  She has shown cervical change. CERVIX: 5cm:  70%:   -2:   mid position:   firm SVE:   Dilation: 5 Effacement (%): 70 Station: -2 Exam by:: Grandville Silos, CNM CONTRACTIONS: regular, every 2 minutes FHR: Fetal heart tracing reviewed. Baseline: 125 bpm, Variability: Good {> 6 bpm), Accelerations: Reactive and Decelerations: Absent Category I   Analgesia: IV pain meds  Labs: Lab Results  Component Value Date   WBC 10.1 02/20/2018   HGB 11.8 02/20/2018   HCT 35.3 02/20/2018   MCV 94 02/20/2018   PLT 305 02/20/2018    ASSESSMENT: 1)  Preterm contractions with small amount of cervical change     Membranes: intact           Active Problems:   Labor and delivery, indication for care   PLAN: expectant management and IV pain medications  Alexandria Orozco, CNM  04/06/2018 1:30 AM

## 2018-04-07 ENCOUNTER — Encounter: Payer: Self-pay | Admitting: Obstetrics and Gynecology

## 2018-04-07 LAB — CBC
HEMATOCRIT: 27.3 % — AB (ref 35.0–47.0)
HEMOGLOBIN: 9.3 g/dL — AB (ref 12.0–16.0)
MCH: 29.9 pg (ref 26.0–34.0)
MCHC: 34 g/dL (ref 32.0–36.0)
MCV: 87.9 fL (ref 80.0–100.0)
Platelets: 266 10*3/uL (ref 150–440)
RBC: 3.1 MIL/uL — AB (ref 3.80–5.20)
RDW: 12.7 % (ref 11.5–14.5)
WBC: 25.1 10*3/uL — ABNORMAL HIGH (ref 3.6–11.0)

## 2018-04-07 LAB — RPR: RPR: NONREACTIVE

## 2018-04-07 LAB — CULTURE, BETA STREP (GROUP B ONLY)

## 2018-04-07 NOTE — Anesthesia Postprocedure Evaluation (Signed)
Anesthesia Post Note  Patient: Alexandria Orozco  Procedure(s) Performed: AN AD [place]  Patient location during evaluation: Mother Baby Anesthesia Type: Epidural Level of consciousness: awake and alert and oriented Pain management: pain level controlled Vital Signs Assessment: post-procedure vital signs reviewed and stable Respiratory status: respiratory function stable Cardiovascular status: stable Postop Assessment: no headache and no backache Anesthetic complications: no     Last Vitals:  Vitals:   04/06/18 2349 04/07/18 0302  BP: 110/77 (!) 89/50  Pulse: 90 67  Resp: 18 18  Temp: 36.8 C 36.7 C  SpO2: 100% 99%    Last Pain:  Vitals:   04/07/18 0302  TempSrc: Oral  PainSc:                  Blima Singer

## 2018-04-07 NOTE — Progress Notes (Signed)
Progress Note - Vaginal Delivery  Alexandria Orozco is a 32 y.o. [phone] now PP day 1 s/p Vaginal, Spontaneous .   Subjective:  The patient reports no complaints, up ad lib, voiding, tolerating PO and + flatus   Objective:  Vital signs in last 24 hours: Temp:  [97.6 F (36.4 C)-98.9 F (37.2 C)] 98 F (36.7 C) (07/23 0859) Pulse Rate:  [60-112] 60 (07/23 0859) Resp:  [14-18] 18 (07/23 0859) BP: (89-110)/(50-77) 92/60 (07/23 0859) SpO2:  [98 %-100 %] 98 % (07/23 0859)  Physical Exam:  General: alert, cooperative, appears stated age and fatigued  Lungs clear bilaterally  Heart: RRR Lochia: appropriate Uterine Fundus: firm @ u DVT Evaluation: No evidence of DVT seen on physical exam. No cords or calf tenderness. No significant calf/ankle edema.    Data Review Recent Labs    04/05/18 2132 04/07/18 0529  HGB 10.8* 9.3*  HCT 31.3* 27.3*    Assessment/Plan: Active Problems:   Labor and delivery, indication for care   Plan for discharge tomorrow   -- Continue routine PP care.     Philip Aspen, CNM 04/07/2018 11:58 AM

## 2018-04-07 NOTE — Lactation Note (Signed)
This note was copied from a baby's chart. Lactation Consultation Note  Patient Name: Alexandria Orozco Today's Date: 04/07/2018    During [facility] rounds, Mom states she is getting enough colostrum to fill bottom of colostrum container. Praise given.She states she has [employer] at home. She denied having any needs at the moment. I reviewed importance of frequent pump sessions and skin to skin when able. I showed her the "My NICU Baby " App to record her pumpings, feedings and other info about NICU from [employer].  Sh ehas LC contact info   Maternal Data    Feeding    LATCH Score                   Interventions    Lactation Tools Discussed/Used     Consult Status      Roque Cash 04/07/2018, 1:58 PM

## 2018-04-08 ENCOUNTER — Encounter: Payer: Managed Care, Other (non HMO) | Admitting: Obstetrics and Gynecology

## 2018-04-08 ENCOUNTER — Ambulatory Visit: Payer: Self-pay

## 2018-04-08 MED ORDER — IBUPROFEN 600 MG PO TABS: 600.0000 mg | ORAL_TABLET | Freq: Four times a day (QID) | ORAL | 0 refills | Status: DC

## 2018-04-08 MED ORDER — NORETHINDRONE 0.35 MG PO TABS: 1.0000 | ORAL_TABLET | Freq: Every day | ORAL | 11 refills | Status: DC

## 2018-04-08 MED ORDER — DOCUSATE SODIUM 100 MG PO CAPS: 100.0000 mg | ORAL_CAPSULE | Freq: Two times a day (BID) | ORAL | 0 refills | Status: DC

## 2018-04-08 NOTE — Progress Notes (Signed)
Dc instructions reviewed with pt.  Dc to home ambulatory to SC[facility] first to see baby.

## 2018-04-08 NOTE — Final Progress Note (Signed)
Discharge Day SOAP Note:  Progress Note - Vaginal Delivery  Alexandria Orozco is a 32 y.o. [phone] now PP day 2 s/p Vaginal, Spontaneous . Delivery was uncomplicated  Subjective  The patient has the following complaints: has no unusual complaints  Pain is controlled with current medications.   Patient is urinating without difficulty.  She is ambulating well.     Objective  Vital signs: BP 119/72 (BP Location: Right Arm)   Pulse (!) 53   Temp 98.3 F (36.8 C) (Oral)   Resp 18   Ht 5' 0.5" (1.537 m)   Wt 164 lb (74.4 kg)   LMP 08/04/2017   SpO2 100%   Breastfeeding? Unknown   BMI 31.50 kg/m   Physical Exam: Gen: NAD Fundus Fundal Tone: Firm  Lochia Amount: Small  Perineum Appearance: Intact     Data Review Labs: CBC Latest Ref Rng & Units 04/07/2018 04/05/2018 02/20/2018  WBC 3.6 - 11.0 K/uL 25.1(H) 14.9(H) 10.1  Hemoglobin 12.0 - 16.0 g/dL 9.3(L) 10.8(L) 11.8  Hematocrit 35.0 - 47.0 % 27.3(L) 31.3(L) 35.3  Platelets 150 - 440 K/uL 266 298 305   O POS  Assessment/Plan  Active Problems:   Labor and delivery, indication for care    Plan for discharge today.   Discharge Instructions: Per After Visit Summary. Activity: Advance as tolerated. Pelvic rest for 6 weeks.  Also refer to After Visit Summary Diet: Regular Medications: Allergies as of 04/08/2018      Reactions   Morphine And Related Itching   Ampicillin Rash   Morphine Rash   Penicillins Rash   Sulfa Antibiotics Rash      Medication List    STOP taking these medications   FIORICET 50-300-40 MG Caps Generic drug:  Butalbital-APAP-Caffeine     TAKE these medications   docusate sodium 100 MG capsule Commonly known as:  COLACE Take 1 capsule (100 mg total) by mouth 2 (two) times daily.   ibuprofen 600 MG tablet Commonly known as:  ADVIL,MOTRIN Take 1 tablet (600 mg total) by mouth every 6 (six) hours.   norethindrone 0.35 MG tablet Commonly known as:   MICRONOR,CAMILA,ERRIN Take 1 tablet (0.35 mg total) by mouth daily.   prenatal multivitamin Tabs tablet Take 1 tablet by mouth daily at 12 noon.      Outpatient follow up:  Postpartum contraception: progestin only pill @ 4 wks pp  Discharged Condition: good  Discharged to: home  Newborn Data: Disposition:NICU  Apgars: APGAR (1 MIN): 8   APGAR (5 MINS): 8   APGAR (10 MINS):    Baby Feeding: Breast    Philip Aspen, CNM  04/08/2018 11:25 AM

## 2018-04-08 NOTE — Lactation Note (Signed)
This note was copied from a baby's chart. Lactation Consultation Note  Patient Name: Boy Noreen Mackintosh Today's Date: 04/08/2018     Maternal Data    Feeding Feeding Type: Breast Milk Nipple Type: Slow - flow Length of feed: 5 min  LATCH Score                   Interventions    Lactation Tools Discussed/Used     Consult Status  LC to mother's room to follow up on any questions or concerns with pumping. Mother states that pumping is going well and she is pumping lots of colostrum. Mother denies any questions or concerns.    Elvera Lennox 6/59/9357, 4:31 PM

## 2018-04-08 NOTE — Discharge Summary (Signed)
Discharge Summary  Date of Admission: 04/05/2018  Date of Discharge: 04/08/2018  Admitting Diagnosis: onset preterm labor at [redacted]w[redacted]d  Mode of Delivery: normal spontaneous vaginal delivery                 Discharge Diagnosis: Preterm Delivery   Intrapartum Procedures: epidural, GBS prophylaxis and pitocin augmentation   Post partum procedures: none  Complications: none                      Discharge Day SOAP Note:  Progress Note - Vaginal Delivery  Alexandria Orozco is a 32 y.o. [phone] now PP day 2 s/p Vaginal, Spontaneous . Delivery was uncomplicated  Subjective  The patient has the following complaints: has no unusual complaints  Pain is controlled with current medications.   Patient is urinating without difficulty.  She is ambulating well.     Objective  Vital signs: BP 119/72 (BP Location: Right Arm)   Pulse (!) 53   Temp 98.3 F (36.8 C) (Oral)   Resp 18   Ht 5' 0.5" (1.537 m)   Wt 164 lb (74.4 kg)   LMP 08/04/2017   SpO2 100%   Breastfeeding? Unknown   BMI 31.50 kg/m   Physical Exam: Gen: NAD Fundus Fundal Tone: Firm  Lochia Amount: Small  Perineum Appearance: Intact     Data Review Labs: CBC Latest Ref Rng & Units 04/07/2018 04/05/2018 02/20/2018  WBC 3.6 - 11.0 K/uL 25.1(H) 14.9(H) 10.1  Hemoglobin 12.0 - 16.0 g/dL 9.3(L) 10.8(L) 11.8  Hematocrit 35.0 - 47.0 % 27.3(L) 31.3(L) 35.3  Platelets 150 - 440 K/uL 266 298 305   O POS  Assessment/Plan  Active Problems:   Labor and delivery, indication for care    Plan for discharge today.   Discharge Instructions: Per After Visit Summary. Activity: Advance as tolerated. Pelvic rest for 6 weeks.  Also refer to After Visit Summary Diet: Regular Medications: Allergies as of 04/08/2018      Reactions   Morphine And Related Itching   Ampicillin Rash   Morphine Rash   Penicillins Rash   Sulfa Antibiotics Rash      Medication List    STOP taking these medications    FIORICET 50-300-40 MG Caps Generic drug:  Butalbital-APAP-Caffeine     TAKE these medications   docusate sodium 100 MG capsule Commonly known as:  COLACE Take 1 capsule (100 mg total) by mouth 2 (two) times daily.   ibuprofen 600 MG tablet Commonly known as:  ADVIL,MOTRIN Take 1 tablet (600 mg total) by mouth every 6 (six) hours.   norethindrone 0.35 MG tablet Commonly known as:  MICRONOR,CAMILA,ERRIN Take 1 tablet (0.35 mg total) by mouth daily.   prenatal multivitamin Tabs tablet Take 1 tablet by mouth daily at 12 noon.      Outpatient follow up:  Postpartum contraception: progestin only pill @ 4 wks pp  Discharged Condition: good  Discharged to: home  Newborn Data: Disposition:NICU  Apgars: APGAR (1 MIN): 8   APGAR (5 MINS): 8   APGAR (10 MINS):    Baby Feeding: Breast    Philip Aspen, CNM  04/08/2018 11:25 AM

## 2018-04-16 ENCOUNTER — Ambulatory Visit: Payer: Self-pay

## 2018-04-16 NOTE — Lactation Note (Signed)
This note was copied from a baby's chart. Lactation Consultation Note  Patient Name: Boy Caelan Atchley Today's Date: 04/16/2018 Reason for consult: Initial assessment   Maternal Data    Feeding Feeding Type: Breast Milk with Formula added   Mother is pumping every three to 4 hours. I have encouraged her to pump here a little more often so she could take a longer stretch at night.              Interventions    Lactation Tools Discussed/Used     Consult Status      Daryel November 04/16/2018, 3:02 PM

## 2018-04-27 ENCOUNTER — Telehealth: Payer: Self-pay | Admitting: Certified Nurse Midwife

## 2018-04-27 ENCOUNTER — Telehealth: Payer: Self-pay

## 2018-04-27 NOTE — Telephone Encounter (Signed)
Alexandria Orozco called asking for Alexandria Orozco to call her back at her earliest convenience to discuss his wife's FMLA paperwork as Alexandria Orozco's claim has now been denied and he states she was not told about a fee to get the forms completed.  He would like to discuss how to get the paperwork process moved along to do another claim to be able to get a check for his wife while she is on leave.  Please advise, thanks.

## 2018-04-27 NOTE — Telephone Encounter (Signed)
The patient called and stated that her claim has been denied because her forms were not processed in the time frame she needed them. I informed the patient that the form fee was paid on 04/21/18 and the forms would be processed from that date and the office has a protocol to complete forms within 7-10 days. The patient was yelling and degrading myself on the phone, I had the patient on speaker with Ivin Booty and Leda Gauze as a witness. I informed the patient that we will complete the paper work as .soon as we possibly can and I can have a nurse contact her as soon as possible. I apologized to the patient for the inconvenience and she then hung up on me. Please advise.  *The forms were received on 04/08/18/ Her 1st day of leave was on 04/06/18/ The fee was not paid until 04/21/18/

## 2018-04-27 NOTE — Telephone Encounter (Signed)
Papers were completed on my lunch hour. Faxed to number given by pt and confirmation of receipt received. AT informed of pts nasty phone call to office.

## 2018-04-27 NOTE — Telephone Encounter (Signed)
Alexandria Orozco informed of pts very nasty phone call. Pt will be informed of completed papers. No need to return call.

## 2018-05-20 ENCOUNTER — Encounter: Payer: Self-pay | Admitting: Certified Nurse Midwife

## 2018-05-20 ENCOUNTER — Ambulatory Visit (INDEPENDENT_AMBULATORY_CARE_PROVIDER_SITE_OTHER): Payer: Managed Care, Other (non HMO) | Admitting: Certified Nurse Midwife

## 2018-05-20 MED ORDER — NORETHIN ACE-ETH ESTRAD-FE 1-20 MG-MCG PO TABS: 1.0000 | ORAL_TABLET | Freq: Every day | ORAL | 11 refills | Status: DC

## 2018-05-20 NOTE — Patient Instructions (Signed)
Colpocleisis Colpocleisis (colpectomy) is a surgical procedure to partially or completely remove and stitch (suture) the vagina closed, including the opening. This surgery may be done to help with problems caused by the falling down (prolapse) of one or more organs. Prolapse could involve the uterus, bladder, or rectum. It is often caused by previous pregnancies and pelvic surgery, heavy straining and lifting for long periods of time, chronic constipation with straining, obesity, and aging. Colpocleisis may be done in women who:  Have stopped menstruating.  Have already had their uterus removed (hysterectomy).  Do not desire to have sexual intercourse. After this surgery, your vagina will not be deep enough to permit vaginal intercourse.  Have medical problems that make more advanced surgery very risky.  Tell a health care provider about:  Any allergies you have.  All medicines you are taking, including vitamins, herbs, eye drops, creams, and over-the-counter medicines.  Any problems you or family members have had with anesthetic medicines.  Any blood disorders you have.  Any surgeries you have had.  Any medical conditions you have.  Any colds or infections you have had recently. What are the risks? Generally, this is a safe procedure. However, problems may occur, including:  Infection.  Bleeding.  Allergic reaction to medicines.  Damage to other structures or organs.  Blood clots in the legs or lungs.  What happens before the procedure? Staying hydrated Follow instructions from your health care provider about hydration, which may include:  Up to 2 hours before the procedure - you may continue to drink clear liquids, such as water, clear fruit juice, black coffee, and plain tea.  Eating and drinking restrictions Follow instructions from your health care provider about eating and drinking, which may include:  8 hours before the procedure - stop eating heavy meals or  foods such as meat, fried foods, or fatty foods.  6 hours before the procedure - stop eating light meals or foods, such as toast or cereal.  6 hours before the procedure - stop drinking milk or drinks that contain milk.  2 hours before the procedure - stop drinking clear liquids.  General instructions  Ask your health care provider about: ? Changing or stopping your regular medicines. This is especially important if you are taking diabetes medicines or blood thinners. ? Taking medicines such as aspirin and ibuprofen. These medicines can thin your blood. Do not take these medicines before your procedure if your health care provider instructs you not to.  Plan to have someone take you home from the hospital or clinic.  You may be given antibiotic medicine to help prevent infection.  You may be asked to shower with a germ-killing soap. What happens during the procedure?  To lower your risk of infection: ? Your health care team will wash or sanitize their hands. ? Your skin will be washed with soap. ? Hair may be removed from the surgical area.  An IV tube will be inserted into one of your veins.  You will be given one or more of the following: ? A medicine to help you relax (sedative). ? A medicine to make you fall asleep (general anesthetic). ? A medicine that is injected into your spine to numb the area below and slightly above the injection site (spinal anesthetic).  The prolapsed organs will be put back (reduced) to their normal position.  The top and bottom of the vagina will be removed out through a small vaginal incision. No abdominal incision will be needed.  The opening of the vagina will be closed using absorbable stitches (sutures). These sutures will not need to be removed. They will dissolve in 1-2 months. The procedure may vary among health care providers and hospitals. What happens after the procedure?  Your blood pressure, heart rate, breathing rate, and blood  oxygen level will be monitored until the medicines you were given have worn off.  You will still have an IV tube in your vein. You will also have a catheter in your bladder.  You may be given an antibiotic to help prevent infection.  You may be given pain medicine.  Do not drive for 24 hours if you were given a sedative. Summary  Colpocleisis (colpectomy) is a surgical procedure to partially or completely remove and stitch (suture) the vagina closed, including the opening.  This surgery may be done to help with problems caused by the falling down (prolapse) of one or more organs.  Before the procedure, ask your health care provider about changing or stopping your regular medicines.  After the procedure, your blood pressure, heart rate, breathing rate, and blood oxygen level will be monitored until the medicines you were given have worn off. This information is not intended to replace advice given to you by your health care provider. Make sure you discuss any questions you have with your health care provider. Document Released: 11/27/2009 Document Revised: 09/11/2016 Document Reviewed: 09/11/2016 Elsevier Interactive Patient Education  2017 [employer].

## 2018-05-20 NOTE — Progress Notes (Signed)
Subjective:    Alexandria Orozco is a 32 y.o. [phone] Caucasian female who presents for a postpartum visit. She is 6 weeks postpartum following a spontaneous vaginal delivery at 35 gestational weeks. Anesthesia: epidural. I have fully reviewed the prenatal and intrapartum course. Postpartum course has been WNL. Baby's course has been WNL. Baby is feeding by bottle. Bleeding irregular bleeding. Bowel function is normal. Bladder function is normal. Patient is not sexually active. Last sexual activity: prior to delivery. Contraception method is oral progesterone-only contraceptive.Will switch today for OCP.  Postpartum depression screening: negative. Score 0.  Last pap 11/13/17 and was HSIL.Pt to return in 3 months for colposcopy.  The following portions of the patient's history were reviewed and updated as appropriate: allergies, current medications, past medical history, past surgical history and problem list.  Review of Systems Pertinent items are noted in HPI.   Vitals:   05/20/18 1106  BP: 119/75  Pulse: 62  Weight: 69.5 kg  Height: 5' (1.524 m)   Patient's last menstrual period was 05/18/2018 (exact date).  Objective:   General:  alert, cooperative and no distress   Breasts:  deferred, no complaints  Lungs: clear to auscultation bilaterally  Heart:  regular rate and rhythm  Abdomen: soft, nontender   Vulva: normal  Vagina: normal vagina  Cervix:  closed  Corpus: Well-involuted  Adnexa:  Non-palpable  Rectal Exam: No  hemorrhoids        Assessment:   Postpartum exam 6 wks s/p SVD Bottle feeding Depression screening Contraception counseling   Plan:  : OCP (estrogen/progesterone). Order placed for [place] . She denies contraindications for use.  Follow up in: 3 months for colposcopy or earlier if needed  Philip Aspen, CNM

## 2018-05-29 ENCOUNTER — Other Ambulatory Visit: Payer: Self-pay

## 2018-05-29 MED ORDER — NORETHIN ACE-ETH ESTRAD-FE 1-20 MG-MCG PO TABS: 1.0000 | ORAL_TABLET | Freq: Every day | ORAL | 11 refills | Status: DC

## 2018-06-08 ENCOUNTER — Ambulatory Visit (INDEPENDENT_AMBULATORY_CARE_PROVIDER_SITE_OTHER): Payer: Managed Care, Other (non HMO) | Admitting: Certified Nurse Midwife

## 2018-06-08 VITALS — BP 123/75 | HR 75 | Ht 60.0 in | Wt 153.1 lb

## 2018-06-08 DIAGNOSIS — F419 Anxiety disorder, unspecified: Secondary | ICD-10-CM

## 2018-06-08 DIAGNOSIS — Z23 Encounter for immunization: Secondary | ICD-10-CM | POA: Diagnosis not present

## 2018-06-08 DIAGNOSIS — Z0000 Encounter for general adult medical examination without abnormal findings: Secondary | ICD-10-CM

## 2018-06-08 MED ORDER — CITALOPRAM HYDROBROMIDE 20 MG PO TABS: 20.0000 mg | ORAL_TABLET | Freq: Every day | ORAL | 0 refills | Status: DC

## 2018-06-08 NOTE — Patient Instructions (Signed)

## 2018-06-08 NOTE — Progress Notes (Signed)
Subjective:     Alexandria Orozco is a 32 y.o. female who presents for new evaluation and treatment of anxiety disorder. She has the following anxiety symptoms: difficulty concentrating, racing thoughts and nervousness. Onset of symptoms was approximately several months ago. Symptoms have been gradually worsening since that time. She denies current suicidal and homicidal ideation. . Risk factors: treated for anxiety in the past. Previous treatment includes Celexa. She complains of the following medication side effects: none. The following portions of the patient's history were reviewed and updated as appropriate: allergies, current medications, past family history, past medical history, past social history, past surgical history and problem list.  Review of Systems Pertinent items are noted in HPI.    Objective:    No exam performed today, Recent exam for postpartum visit.    Assessment:    anxiety disorder. Possible organic contributing causes are: none.   Plan:     Medications: Celexa.  She states she was on celexa in the past and it worked well for her. Discussed placing referral for counseling. She is open to seeing a Social worker. Order placed for celexa and for referral . Follow up 4-6 wks.   Philip Aspen, CNM   .

## 2018-06-17 ENCOUNTER — Telehealth: Payer: Self-pay | Admitting: General Practice

## 2018-06-28 ENCOUNTER — Other Ambulatory Visit: Payer: Self-pay | Admitting: Certified Nurse Midwife

## 2018-07-13 ENCOUNTER — Encounter: Payer: Managed Care, Other (non HMO) | Admitting: Certified Nurse Midwife

## 2018-07-30 ENCOUNTER — Ambulatory Visit
Admission: EM | Admit: 2018-07-30 | Discharge: 2018-07-30 | Disposition: A | Discharge status: Home / Self Care | Payer: Managed Care, Other (non HMO) | Attending: Family Medicine | Admitting: Family Medicine

## 2018-07-30 ENCOUNTER — Other Ambulatory Visit: Payer: Self-pay

## 2018-07-30 ENCOUNTER — Encounter: Payer: Self-pay | Admitting: Emergency Medicine

## 2018-07-30 DIAGNOSIS — M791 Myalgia, unspecified site: Secondary | ICD-10-CM | POA: Diagnosis not present

## 2018-07-30 DIAGNOSIS — R05 Cough: Secondary | ICD-10-CM | POA: Diagnosis not present

## 2018-07-30 DIAGNOSIS — J111 Influenza due to unidentified influenza virus with other respiratory manifestations: Secondary | ICD-10-CM

## 2018-07-30 DIAGNOSIS — R69 Illness, unspecified: Principal | ICD-10-CM

## 2018-07-30 DIAGNOSIS — R509 Fever, unspecified: Secondary | ICD-10-CM

## 2018-07-30 LAB — RAPID INFLUENZA A&B ANTIGENS
Influenza A (ARMC): NEGATIVE
Influenza B (ARMC): NEGATIVE

## 2018-07-30 LAB — RAPID STREP SCREEN (MED CTR MEBANE ONLY): Streptococcus, Group A Screen (Direct): NEGATIVE

## 2018-07-30 MED ORDER — IBUPROFEN 600 MG PO TABS
600.0000 mg | ORAL_TABLET | Freq: Once | ORAL | Status: AC
  Administered 2018-07-30: 600 mg via ORAL

## 2018-07-30 MED ORDER — OSELTAMIVIR PHOSPHATE 75 MG PO CAPS: 75.0000 mg | ORAL_CAPSULE | Freq: Two times a day (BID) | ORAL | 0 refills | Status: DC

## 2018-07-30 NOTE — Discharge Instructions (Addendum)
Take medication as prescribed. Rest. Drink plenty of fluids. Over the counter tylenol and ibuprofen as needed.   Follow up with your primary care physician this week as needed. Return to Urgent care for new or worsening concerns.

## 2018-07-30 NOTE — ED Provider Notes (Signed)
[facility] ____________________________________________  Time seen: Approximately 6:13 PM  I have reviewed the triage vital signs and the nursing notes.   HISTORY  Chief Complaint Generalized Body Aches and Chills   HPI Alexandria Orozco is a 32 y.o. female patient with family bedside for evaluation of quick onset of generalized body aches, chills, subjective fever, some runny nose and cough as well as sore throat.  Reports quick onset of symptoms last night and progressed throughout the day today.  Did take a dose of Tylenol last around 3:00 today.  No other over-the-counter medications taken with same complaints.  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  Denies home sick contacts.  Has a 98-month-old at home, not currently breast-feeding.  Denies any chance of current pregnancy.  Continued to overall drink fluids well, decreased appetite.  Denies any chest pain, shortness of breath, abdominal pain, rash or other complaints.  Denies other aggravating factors.  Reports otherwise doing well.  Denies recent sickness.   Past Medical History:  Diagnosis Date  . Migraines     Patient Active Problem List   Diagnosis Date Noted  . Abdominal pain, epigastric   . Gastritis without bleeding   . Abdominal pain 07/20/2017  . Syncope and collapse 04/10/2016    Past Surgical History:  Procedure Laterality Date  . CHOLECYSTECTOMY    . ESOPHAGOGASTRODUODENOSCOPY (EGD) WITH PROPOFOL N/A 08/06/2017   Procedure: ESOPHAGOGASTRODUODENOSCOPY (EGD) WITH PROPOFOL;  Surgeon: Lucilla Lame, MD;  Location: [facility];  Service: Endoscopy;  Laterality: N/A;  . TONSILLECTOMY       No current facility-administered medications for this encounter.   Current Outpatient Medications:  .  citalopram (CELEXA) 20 MG tablet, Take 1 tablet (20 mg total) by mouth daily., Disp: 42 tablet, Rfl: 0 .  norethindrone-ethinyl estradiol (JUNEL FE,GILDESS FE,LOESTRIN FE) 1-20 MG-MCG  tablet, Take 1 tablet by mouth daily., Disp: 1 Package, Rfl: 11 .  oseltamivir (TAMIFLU) 75 MG capsule, Take 1 capsule (75 mg total) by mouth every 12 (twelve) hours., Disp: 10 capsule, Rfl: 0  Allergies Morphine and related; Ampicillin; Morphine; Penicillins; and Sulfa antibiotics  Family History  Problem Relation Age of Onset  . Diabetes Mother   . Heart attack Father   . Diabetes Paternal Uncle   . Diabetes Maternal Grandmother   . Pancreatitis Maternal Grandmother     Social History Social History   Tobacco Use  . Smoking status: Never Smoker  . Smokeless tobacco: Never Used  Substance Use Topics  . Alcohol use: No  . Drug use: No    Review of Systems Constitutional: Subjective fever. ENT: as above.  Cardiovascular: Denies chest pain. Respiratory: Denies shortness of breath. Gastrointestinal: No abdominal pain.  No nausea, no vomiting.  No diarrhea.   Genitourinary: Negative for dysuria. Musculoskeletal: Negative for back pain. Skin: Negative for rash.  ____________________________________________   PHYSICAL EXAM:  VITAL SIGNS: ED Triage Vitals  Enc Vitals Group     BP 07/30/18 1730 117/78     Pulse Rate 07/30/18 1730 (!) 107     Resp 07/30/18 1730 16     Temp 07/30/18 1730 (!) 100.4 F (38 C)     Temp Source 07/30/18 1730 Oral     SpO2 07/30/18 1730 100 %     Weight 07/30/18 1731 148 lb (67.1 kg)     Height 07/30/18 1731 5' (1.524 m)     Head Circumference --      Peak Flow --  Pain Score 07/30/18 1730 5     Pain Loc --      Pain Edu? --      Excl. in [place]? --     Constitutional: Alert and oriented. Well appearing and in no acute distress. Eyes: Conjunctivae are normal. Head: Atraumatic. No sinus tenderness to palpation. No swelling. No erythema.  Ears: no erythema, normal TMs bilaterally.   Nose:Nasal congestion with clear rhinorrhea  Mouth/Throat: Mucous membranes are moist. Mild pharyngeal erythema. No tonsillar swelling or exudate.  Neck:  No stridor.  No cervical spine tenderness to palpation. Hematological/Lymphatic/Immunilogical: No cervical lymphadenopathy. Cardiovascular: Normal rate, regular rhythm. Grossly normal heart sounds.  Good peripheral circulation. Respiratory: Normal respiratory effort.  No retractions. No wheezes, rales or rhonchi. Good air movement.  Musculoskeletal: Ambulatory with steady gait. No cervical, thoracic or lumbar tenderness to palpation. Neurologic:  Normal speech and language. No gait instability. Skin:  Skin appears warm, dry and intact. No rash noted. Psychiatric: Mood and affect are normal. Speech and behavior are normal.  ___________________________________________   LABS (all labs ordered are listed, but only abnormal results are displayed)  Labs Reviewed  RAPID STREP SCREEN ([facility] ONLY)  RAPID INFLUENZA A&B ANTIGENS ([facility] ONLY)  CULTURE, GROUP A STREP [facility])    PROCEDURES Procedures    INITIAL IMPRESSION / ASSESSMENT AND PLAN / ED COURSE  Pertinent labs & imaging results that were available during my care of the patient were reviewed by me and considered in my medical decision making (see chart for details).  Overall well-appearing patient.  No acute distress.  Quick strep negative, will culture.  Influenza test negative.  However suspect influenza-like illness.  Discussed use of Tamiflu, patient request, Rx given.  Encouraged rest, fluids, supportive care, antipyretics.  800 mg ibuprofen given once in urgent care.  Discussed supportive care.  Work note given for today and tomorrow.  Discussed strict follow-up and return parameters.Discussed indication, risks and benefits of medications with patient.  Discussed follow up with Primary care physician this week as needed. Discussed follow up and return parameters including no resolution or any worsening concerns. Patient verbalized understanding and agreed to plan.   ____________________________________________   FINAL  CLINICAL IMPRESSION(S) / ED DIAGNOSES  Final diagnoses:  Influenza-like illness     ED Discharge Orders         Ordered    oseltamivir (TAMIFLU) 75 MG capsule  Every 12 hours     07/30/18 1854           Note: This dictation was prepared with Dragon dictation along with smaller phrase technology. Any transcriptional errors that result from this process are unintentional.         Marylene Land, NP 07/30/18 2028

## 2018-07-30 NOTE — ED Triage Notes (Signed)
Patient in today c/o body aches, chills, headache since last night and getting worse today.

## 2018-08-02 LAB — CULTURE, GROUP A STREP (THRC)

## 2018-08-19 ENCOUNTER — Encounter: Payer: Self-pay | Admitting: *Deleted

## 2018-08-20 ENCOUNTER — Encounter: Payer: Managed Care, Other (non HMO) | Admitting: Obstetrics and Gynecology

## 2018-08-24 ENCOUNTER — Encounter: Payer: Self-pay | Admitting: Obstetrics and Gynecology

## 2018-08-31 ENCOUNTER — Encounter: Payer: Self-pay | Admitting: Obstetrics and Gynecology

## 2018-09-30 ENCOUNTER — Encounter: Payer: Self-pay | Admitting: Obstetrics and Gynecology

## 2018-11-04 ENCOUNTER — Encounter: Payer: Self-pay | Admitting: Obstetrics and Gynecology

## 2018-11-04 ENCOUNTER — Other Ambulatory Visit (HOSPITAL_COMMUNITY)
Admission: RE | Admit: 2018-11-04 | Discharge: 2018-11-04 | Disposition: A | Discharge status: Home / Self Care | Payer: Managed Care, Other (non HMO) | Source: Ambulatory Visit | Attending: Obstetrics and Gynecology | Admitting: Obstetrics and Gynecology

## 2018-11-04 ENCOUNTER — Ambulatory Visit (INDEPENDENT_AMBULATORY_CARE_PROVIDER_SITE_OTHER): Payer: Self-pay | Admitting: Obstetrics and Gynecology

## 2018-11-04 VITALS — BP 120/72 | HR 101 | Ht 63.0 in | Wt 164.9 lb

## 2018-11-04 DIAGNOSIS — R87613 High grade squamous intraepithelial lesion on cytologic smear of cervix (HGSIL): Secondary | ICD-10-CM | POA: Insufficient documentation

## 2018-11-04 DIAGNOSIS — D060 Carcinoma in situ of endocervix: Secondary | ICD-10-CM

## 2018-11-04 NOTE — Patient Instructions (Signed)
Colposcopy, Care After  This sheet gives you information about how to care for yourself after your procedure. Your health care provider may also give you more specific instructions. If you have problems or questions, contact your health care provider.  What can I expect after the procedure?  If you had a colposcopy without a biopsy, you can expect to feel fine right away, but you may have some spotting for a few days. You can go back to your normal activities.  If you had a colposcopy with a biopsy, it is common to have:   Soreness and pain. This may last for a few days.   Light-headedness.   Mild vaginal bleeding or dark-colored, grainy discharge. This may last for a few days. The discharge may be due to a solution that was used during the procedure. You may need to wear a sanitary pad during this time.   Spotting for at least 48 hours after the procedure.  Follow these instructions at home:     Take over-the-counter and prescription medicines only as told by your health care provider. Talk with your health care provider about what type of over-the-counter pain medicine and prescription medicine you can start taking again. It is especially important to talk with your health care provider if you take blood-thinning medicine.   Do not drive or use heavy machinery while taking prescription pain medicine.   For at least 3 days after your procedure, or as long as told by your health care provider, avoid:  ? Douching.  ? Using tampons.  ? Having sexual intercourse.   Continue to use birth control (contraception).   Limit your physical activity for the first day after the procedure as told by your health care provider. Ask your health care provider what activities are safe for you.   It is up to you to get the results of your procedure. Ask your health care provider, or the department performing the procedure, when your results will be ready.   Keep all follow-up visits as told by your health care provider.  This is important.  Contact a health care provider if:   You develop a skin rash.  Get help right away if:   You are bleeding heavily from your vagina or you are passing blood clots. This includes using more than one sanitary pad per hour for 2 hours in a row.   You have a fever or chills.   You have pelvic pain.   You have abnormal, yellow-colored, or bad-smelling vaginal discharge. This could be a sign of infection.   You have severe pain or cramps in your lower abdomen that do not get better with medicine.   You feel light-headed or dizzy, or you faint.  Summary   If you had a colposcopy without a biopsy, you can expect to feel fine right away, but you may have some spotting for a few days. You can go back to your normal activities.   If you had a colposcopy with a biopsy, you may notice mild pain and spotting for 48 hours after the procedure.   Avoid douching, using tampons, and having sexual intercourse for 3 days after the procedure or as long as told by your health care provider.   Contact your health care provider if you have bleeding, severe pain, or signs of infection.  This information is not intended to replace advice given to you by your health care provider. Make sure you discuss any questions you have with your   health care provider.  Document Released: 06/23/2013 Document Revised: 04/19/2016 Document Reviewed: 04/19/2016  Elsevier Interactive Patient Education  2019 [employer]

## 2018-11-04 NOTE — Addendum Note (Signed)
Addended by: Keturah Barre L on: 11/04/2018 01:48 PM   Modules accepted: Orders

## 2018-11-04 NOTE — Progress Notes (Signed)
[facility] COLPOSCOPY PROCEDURE NOTE  33 y.o. 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 Discussed role for HPV in cervical dysplasia, need for surveillance.  Patient given informed consent, signed copy in the chart, time out was performed.  Placed in lithotomy position. Cervix viewed with speculum and colposcope after application of acetic acid.  PAP OBTAINED AND CERVIX WAS VERY FRIABLE. Microscopic wet-mount exam shows negative for pathogens, normal epithelial cells, white blood cells.  Colposcopy adequate? Yes  acetowhite lesion(s) noted at 10 & 6 o'clock; corresponding biopsies obtained.  ECC specimen obtained. All specimens were labelled and sent to pathology. See scanned colpo sheet with detailed drawing.   Patient was given post procedure instructions.  Will follow up pathology and manage accordingly.  Routine preventative health maintenance measures emphasized.     Kolbee Bogusz Rockney Ghee, CNM

## 2018-11-06 ENCOUNTER — Telehealth: Payer: Self-pay | Admitting: Obstetrics and Gynecology

## 2018-11-06 NOTE — Telephone Encounter (Signed)
The patient called and stated that she missed a call from Melody. The patient is requesting a call back if possible. Please advise.

## 2018-11-06 NOTE — Telephone Encounter (Signed)
pls advise

## 2018-11-10 ENCOUNTER — Encounter: Payer: Self-pay | Admitting: Obstetrics and Gynecology

## 2018-11-10 ENCOUNTER — Ambulatory Visit (INDEPENDENT_AMBULATORY_CARE_PROVIDER_SITE_OTHER): Payer: Self-pay | Admitting: Obstetrics and Gynecology

## 2018-11-10 VITALS — BP 106/62 | HR 82 | Ht 61.0 in | Wt 166.4 lb

## 2018-11-10 DIAGNOSIS — D061 Carcinoma in situ of exocervix: Secondary | ICD-10-CM

## 2018-11-10 NOTE — Progress Notes (Signed)
    GYNECOLOGY PROGRESS NOTE  Subjective:    Patient ID: Alexandria Orozco, female    DOB: 1986-01-12, 33 y.o.   MRN: [id number]  HPI  Patient is a 33 y.o. [phone] female who presents for consultation for LEEP procedure.  Patient referred by Lorelle Gibbs, CNM. She has a history of recent HGSIL pap smear during pregnancy in 10/2017. 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  Patient denies any abnormal bleeding, pelvic pain.  She is accompanied by her husband today.   The following portions of the patient's history were reviewed and updated as appropriate: allergies, current medications, past family history, past medical history, past social history, past surgical history and problem list.  Review of Systems Pertinent items noted in HPI and remainder of comprehensive ROS otherwise negative.   Objective:   Blood pressure 106/62, pulse 82, height 5\' 1"  (1.549 m), weight 166 lb 6.4 oz (75.5 kg), not currently breastfeeding. General appearance: alert and no distress Exam deferred today.  Refer to previous exam noted last week by Lorelle Gibbs (date of service 11/04/2018)   Pathology Pap smear 10/24/17: HIGH-GRADE SQUAMOUS INTRAEPITHELIAL LESION (HSIL).    Diagnosis 1. Cervix, biopsy, 1 o'clock - AT LEAST SQUAMOUS CELL CARCINOMA IN SITU, SEE COMMENT. 2. Cervix, biopsy, 10 o'clock - AT LEAST SQUAMOUS CELL CARCINOMA IN SITU, SEE COMMENT. 3. Endocervix, curettage, 6 o'clock - DETACHED FRAGMENTS OF HIGH GRADE SQUAMOUS INTRAEPITHELIAL LESION, CIN-III (SEVERE DYSPLASIA/CIS).   Assessment:   Cervical adenocarcinoma in situ  Plan:  - Discussion had on recent biopsy results.  Advised that she would need further evaluation/treatment with a cold knife cone to fully assess depth of invasion and cell characteristics.  Discussed risks and benefits of the procedure. Reviewed recovery time and expected post-operative course.  All questions  answered to patient's satisfaction. Scheduled for CKC on March 9th. Preop done today.  - Discussio also had with patient regarding reproductive plans.  Couple notes that they are not sure if they desire 1 more child, or if they are done with childbearing. Advised that once CKC has been performed, if they are considering childbearing, as long as cervical cancer is not final diagnosis, can consider trying again with close cervical surveillance.   A total of 15 minutes were spent face-to-face with the patient during this encounter and over half of that time dealt with counseling and coordination of care.   Rubie Maid, MD [facility]

## 2018-11-10 NOTE — Progress Notes (Signed)
Pt is present today for LEEP consult. Pt stated that she is doing well no problems.

## 2018-11-10 NOTE — Patient Instructions (Signed)
Cervical Conization  Cervical conization (cone biopsy) is a procedure in which a cone-shaped portion of the cervix is cut out so that it can be examined under a microscope. The procedure is done to check for cancer cells or cells that might turn into cancer (precancerous cells). You may have this procedure if:  You have abnormal bleeding from your cervix.  You had an abnormal Pap test.  Something abnormal was seen on your cervix during an exam.  This procedure is performed in either a health care provider's office or in an operating room.  Tell a health care provider about:  Any allergies you have.  All medicines you are taking, including vitamins, herbs, eye drops, creams, and over-the-counter medicines.  Any problems you or family members have had with the use of anesthetic medicines.  Any blood disorders you have.  Any surgeries you have had.  Any medical conditions you have.  Your smoking habits.  When you normally have your period.  Whether you are pregnant or may be pregnant.  What are the risks?  Generally, this is a safe procedure. However, problems may occur, including:  Heavy bleeding for several days or weeks after the procedure.  Allergic reactions to medicines or dyes.  Increased risk of preterm labor in future pregnancies.  Infection (rare).  Damage to the cervix or other structures or organs (rare).  What happens before the procedure?  Staying hydrated  Follow instructions from your health care provider about hydration, which may include:  Up to 2 hours before the procedure - you may continue to drink clear liquids, such as water, clear fruit juice, black coffee, and plain tea.  Eating and drinking restrictions  Follow instructions from your health care provider about eating and drinking, which may include:  8 hours before the procedure - stop eating heavy meals or foods such as meat, fried foods, or fatty foods.  6 hours before the procedure - stop eating light meals or foods, such as toast or  cereal.  6 hours before the procedure - stop drinking milk or drinks that contain milk.  2 hours before the procedure - stop drinking clear liquids.  General instructions  Do not douche, have sex, use tampons, or use any vaginal medicines before the procedure as told by your health care provider.  You may be asked to empty your bladder and bowel right before the procedure.  Ask your health care provider about:  Changing or stopping your normal medicines. This is important if you take diabetes medicines or blood thinners.  Taking medicines such as aspirin and ibuprofen. These medicines can thin your blood. Do not take these medicines before your procedure if your doctor tells you not to.  Plan to have someone take you home from the hospital or clinic.  What happens during the procedure?  To reduce your risk of infection:  Your health care team will wash or sanitize their hands.  Your skin will be washed with soap.  Hair may be removed from the surgical area.  You will undress from the waist down and be given a gown to wear.  You will lie on an examining table and put your feet in stirrups.  An IV tube will be inserted into one of your veins.  You will be given one or more of the following:  A medicine to help you relax (sedative).  A medicine to numb the area (local anesthetic).  A medicine to make you fall asleep (general anesthetic).  A   medicine that numbs the cervix (cervical block).  A lubricated device called a speculum will be inserted into your vagina. It will be used to spread open the walls of the vagina so your health care provider can see the inside of the vagina and cervix better.  An instrument that has a magnifying lens and a light (colposcope) will let your health care provider examine the cervix more closely.  Your health care provider will apply a solution to your cervix. This turns abnormal areas a pale color.  A tissue sample will be removed from the cervix using one of the following methods:  The  cold knife method. In this method, the tissue is cut out with a knife (scalpel).  The loop electrosurgical excision procedure (LEEP) method. In this method, the tissue is cut out with a thin wire that can burn (cauterize) the tissue with an electrical current.  Laser treatment method. In this method, the tissue is cut out and then cauterized with a laser beam to prevent bleeding.  Your health care provider will apply a paste over the biopsy areas to help control bleeding.  The tissue sample will be examined under a microscope.  The procedure may vary among health care providers and hospitals.  What happens after the procedure?  Your blood pressure, heart rate, breathing rate, and blood oxygen level will be monitored often until the medicines you were given have worn off.  If you were given a local anesthetic, you will rest at the clinic or hospital until you are stable and feel ready to go home.  If you were given a general anesthetic, you may be monitored for a longer period of time.  You may have some cramping.  You may have bloody discharge or light to moderate bleeding.  You may have dark discharge coming from your vagina. This is from the paste used on the cervix to prevent bleeding.  Summary  Cervical conization is a procedure in which a cone-shaped portion of the cervix is cut out so that it can be examined under a microscope.  The procedure is done to check for cancer cells or cells that might turn into cancer (precancerous cells).  This information is not intended to replace advice given to you by your health care provider. Make sure you discuss any questions you have with your health care provider.  Document Released: 06/12/2005 Document Revised: 09/04/2016 Document Reviewed: 09/04/2016  Elsevier Interactive Patient Education  2019 [employer]

## 2018-11-10 NOTE — H&P (View-Only) (Signed)
GYNECOLOGY PREOPERATIVE HISTORY AND PHYSICAL   Subjective:  Alexandria Orozco is a 33 y.o. [phone] here for surgical management of cervical dysplasia.   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 No significant preoperative concerns.  Proposed surgery: Cold Knife Cone   Pertinent Gynecological History: Menses: regular every month without intermenstrual spotting Contraception: OCP (estrogen/progesterone) Last pap: abnormal: HGSIL Date: 10/24/2017   Past Medical History:  Diagnosis Date  . Migraines    Past Surgical History:  Procedure Laterality Date  . CHOLECYSTECTOMY    . ESOPHAGOGASTRODUODENOSCOPY (EGD) WITH PROPOFOL N/A 08/06/2017   Procedure: ESOPHAGOGASTRODUODENOSCOPY (EGD) WITH PROPOFOL;  Surgeon: Lucilla Lame, MD;  Location: [facility];  Service: Endoscopy;  Laterality: N/A;  . TONSILLECTOMY     OB History  Gravida Para Term Preterm AB Living  3 3 1 2   3   SAB TAB Ectopic Multiple Live Births        0 3    # Outcome Date GA Lbr Len/2nd Weight Sex Delivery Anes PTL Lv  3 Preterm 04/06/18 [redacted]w[redacted]d / 00:05 6 lb 7.7 oz (2.94 kg) M Vag-Spont EPI  LIV  2 Term 2017   6 lb 9 oz (2.977 kg) F Vag-Spont  N LIV  1 Preterm 2008   5 lb 9 oz (2.523 kg) M Vag-Spont  Y LIV  Patient denies any other pertinent gynecologic issues.  Family History  Problem Relation Age of Onset  . Diabetes Mother   . Heart attack Father   . Diabetes Paternal Uncle   . Diabetes Maternal Grandmother   . Pancreatitis Maternal Grandmother    Social History   Socioeconomic History  . Marital status: Married    Spouse name: Not on file  . Number of children: Not on file  . Years of education: Not on file  . Highest education level: Not on file  Occupational History  . Not on file  Social Needs  . Financial resource strain: Not on file  . Food insecurity:    Worry: Not on file    Inability: Not on file  . Transportation needs:   Medical: Not on file    Non-medical: Not on file  Tobacco Use  . Smoking status: Never Smoker  . Smokeless tobacco: Never Used  Substance and Sexual Activity  . Alcohol use: No  . Drug use: No  . Sexual activity: Yes    Birth control/protection: Pill  Lifestyle  . Physical activity:    Days per week: Not on file    Minutes per session: Not on file  . Stress: Not on file  Relationships  . Social connections:    Talks on phone: Not on file    Gets together: Not on file    Attends religious service: Not on file    Active member of club or organization: Not on file    Attends meetings of clubs or organizations: Not on file    Relationship status: Not on file  . Intimate partner violence:    Fear of current or ex partner: Not on file    Emotionally abused: Not on file    Physically abused: Not on file    Forced sexual activity: Not on file  Other Topics Concern  . Not on file  Social History Narrative   Lives at home, independent   Current Outpatient Medications on File Prior to Visit  Medication Sig Dispense Refill  . citalopram (CELEXA) 20 MG tablet  Take 1 tablet (20 mg total) by mouth daily. 42 tablet 0  . norethindrone-ethinyl estradiol (JUNEL FE,GILDESS FE,LOESTRIN FE) 1-20 MG-MCG tablet Take 1 tablet by mouth daily. 1 Package 11   No current facility-administered medications on file prior to visit.    Allergies  Allergen Reactions  . Morphine And Related Itching  . Ampicillin Rash  . Morphine Rash  . Penicillins Rash  . Sulfa Antibiotics Rash     Review of Systems Constitutional: No recent fever/chills/sweats Respiratory: No recent cough/bronchitis Cardiovascular: No chest pain Gastrointestinal: No recent nausea/vomiting/diarrhea Genitourinary: No UTI symptoms Hematologic/lymphatic:No history of coagulopathy or recent blood thinner use    Objective:   Blood pressure 106/62, pulse 82, height 5\' 1"  (1.549 m), weight 166 lb 6.4 oz (75.5 kg), not currently  breastfeeding. CONSTITUTIONAL: Well-developed, well-nourished female in no acute distress.  HENT:  Normocephalic, atraumatic, External right and left ear normal. Oropharynx is clear and moist EYES: Conjunctivae and EOM are normal. Pupils are equal, round, and reactive to light. No scleral icterus.  NECK: Normal range of motion, supple, no masses SKIN: Skin is warm and dry. No rash noted. Not diaphoretic. No erythema. No pallor. NEUROLOGIC: Alert and oriented to person, place, and time. Normal reflexes, muscle tone coordination. No cranial nerve deficit noted. PSYCHIATRIC: Normal mood and affect. Normal behavior. Normal judgment and thought content. CARDIOVASCULAR: Normal heart rate noted, regular rhythm RESPIRATORY: Effort and breath sounds normal, no problems with respiration noted ABDOMEN: Soft, nontender, nondistended. PELVIC: Deferred MUSCULOSKELETAL: Normal range of motion. No edema and no tenderness. 2+ distal pulses.   Labs: Pre-op labs ordered   Pathology Pap smear 10/24/17: HIGH-GRADE SQUAMOUS INTRAEPITHELIAL LESION (HSIL).    Diagnosis 1. Cervix, biopsy, 1 o'clock - AT LEAST SQUAMOUS CELL CARCINOMA IN SITU, SEE COMMENT. 2. Cervix, biopsy, 10 o'clock - AT LEAST SQUAMOUS CELL CARCINOMA IN SITU, SEE COMMENT. 3. Endocervix, curettage, 6 o'clock - DETACHED FRAGMENTS OF HIGH GRADE SQUAMOUS INTRAEPITHELIAL LESION, CIN-III (SEVERE DYSPLASIA/CIS).    Assessment:    Cervical adenocarcinoma in situ   Plan:    Counseling: Procedure, risks, reasons, benefits and complications (including injury to bladder, major blood vessel, bleeding, possibility of transfusion, infection, or fistula formation) reviewed in detail. Likelihood of success in alleviating the patient's condition was discussed. Routine postoperative instructions will be reviewed with the patient and her family in detail after surgery.  The patient concurred with the proposed plan, giving informed written consent for  the surgery.   Preop testing ordered. Instructions reviewed, including NPO after midnight.      Rubie Maid, MD [facility]

## 2018-11-10 NOTE — H&P (Signed)
GYNECOLOGY PREOPERATIVE HISTORY AND PHYSICAL   Subjective:  Alexandria Orozco is a 33 y.o. [phone] here for surgical management of cervical dysplasia.   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 No significant preoperative concerns.  Proposed surgery: Cold Knife [facility]one   Pertinent Gynecological History: Menses: regular every month without intermenstrual spotting Contraception: OCP (estrogen/progesterone) Last pap: abnormal: HGSIL Date: 10/24/2017   Past Medical History:  Diagnosis Date  . Migraines    Past Surgical History:  Procedure Laterality Date  . CHOLECYSTECTOMY    . ESOPHAGOGASTRODUODENOSCOPY (EGD) WITH PROPOFOL N/A 08/06/2017   Procedure: ESOPHAGOGASTRODUODENOSCOPY (EGD) WITH PROPOFOL;  Surgeon: Lucilla Lame, MD;  Location: [place];  Service: Endoscopy;  Laterality: N/A;  . TONSILLECTOMY     OB History  Gravida Para Term Preterm AB Living  3 3 1 2   3   SAB TAB Ectopic Multiple Live Births        0 3    # Outcome Date GA Lbr Len/2nd Weight Sex Delivery Anes PTL Lv  3 Preterm 04/06/18 [redacted]w[redacted]d / 00:05 6 lb 7.7 oz (2.94 kg) M Vag-Spont EPI  LIV  2 Term 2017   6 lb 9 oz (2.977 kg) F Vag-Spont  N LIV  1 Preterm 2008   5 lb 9 oz (2.523 kg) M Vag-Spont  Y LIV  Patient denies any other pertinent gynecologic issues.  Family History  Problem Relation Age of Onset  . Diabetes Mother   . Heart attack Father   . Diabetes Paternal Uncle   . Diabetes Maternal Grandmother   . Pancreatitis Maternal Grandmother    Social History   Socioeconomic History  . Marital status: Married    Spouse name: Not on file  . Number of children: Not on file  . Years of education: Not on file  . Highest education level: Not on file  Occupational History  . Not on file  Social Needs  . Financial resource strain: Not on file  . Food insecurity:    Worry: Not on file    Inability: Not on file  . Transportation needs:   Medical: Not on file    Non-medical: Not on file  Tobacco Use  . Smoking status: Never Smoker  . Smokeless tobacco: Never Used  Substance and Sexual Activity  . Alcohol use: No  . Drug use: No  . Sexual activity: Yes    Birth control/protection: Pill  Lifestyle  . Physical activity:    Days per week: Not on file    Minutes per session: Not on file  . Stress: Not on file  Relationships  . Social connections:    Talks on phone: Not on file    Gets together: Not on file    Attends religious service: Not on file    Active member of club or organization: Not on file    Attends meetings of clubs or organizations: Not on file    Relationship status: Not on file  . Intimate partner violence:    Fear of current or ex partner: Not on file    Emotionally abused: Not on file    Physically abused: Not on file    Forced sexual activity: Not on file  Other Topics Concern  . Not on file  Social History Narrative   Lives at home, independent   Current Outpatient Medications on File Prior to Visit  Medication Sig Dispense Refill  . citalopram (CELEXA) 20 MG tablet  Take 1 tablet (20 mg total) by mouth daily. 42 tablet 0  . norethindrone-ethinyl estradiol (JUNEL FE,GILDESS FE,LOESTRIN FE) 1-20 MG-MCG tablet Take 1 tablet by mouth daily. 1 Package 11   No current facility-administered medications on file prior to visit.    Allergies  Allergen Reactions  . Morphine And Related Itching  . Ampicillin Rash  . Morphine Rash  . Penicillins Rash  . Sulfa Antibiotics Rash     Review of Systems Constitutional: No recent fever/chills/sweats Respiratory: No recent cough/bronchitis Cardiovascular: No chest pain Gastrointestinal: No recent nausea/vomiting/diarrhea Genitourinary: No UTI symptoms Hematologic/lymphatic:No history of coagulopathy or recent blood thinner use    Objective:   Blood pressure 106/62, pulse 82, height 5\' 1"  (1.549 m), weight 166 lb 6.4 oz (75.5 kg), not currently  breastfeeding. CONSTITUTIONAL: Well-developed, well-nourished female in no acute distress.  HENT:  Normocephalic, atraumatic, External right and left ear normal. Oropharynx is clear and moist EYES: Conjunctivae and EOM are normal. Pupils are equal, round, and reactive to light. No scleral icterus.  NECK: Normal range of motion, supple, no masses SKIN: Skin is warm and dry. No rash noted. Not diaphoretic. No erythema. No pallor. NEUROLOGIC: Alert and oriented to person, place, and time. Normal reflexes, muscle tone coordination. No cranial nerve deficit noted. PSYCHIATRIC: Normal mood and affect. Normal behavior. Normal judgment and thought content. CARDIOVASCULAR: Normal heart rate noted, regular rhythm RESPIRATORY: Effort and breath sounds normal, no problems with respiration noted ABDOMEN: Soft, nontender, nondistended. PELVIC: Deferred MUSCULOSKELETAL: Normal range of motion. No edema and no tenderness. 2+ distal pulses.   Labs: Pre-op labs ordered   Pathology Pap smear 10/24/17: HIGH-GRADE SQUAMOUS INTRAEPITHELIAL LESION (HSIL).    Diagnosis 1. Cervix, biopsy, 1 o'clock - AT LEAST SQUAMOUS CELL CARCINOMA IN SITU, SEE COMMENT. 2. Cervix, biopsy, 10 o'clock - AT LEAST SQUAMOUS CELL CARCINOMA IN SITU, SEE COMMENT. 3. Endocervix, curettage, 6 o'clock - DETACHED FRAGMENTS OF HIGH GRADE SQUAMOUS INTRAEPITHELIAL LESION, CIN-III (SEVERE DYSPLASIA/CIS).    Assessment:    Cervical adenocarcinoma in situ   Plan:    Counseling: Procedure, risks, reasons, benefits and complications (including injury to bladder, major blood vessel, bleeding, possibility of transfusion, infection, or fistula formation) reviewed in detail. Likelihood of success in alleviating the patient's condition was discussed. Routine postoperative instructions will be reviewed with the patient and her family in detail after surgery.  The patient concurred with the proposed plan, giving informed written consent for  the surgery.   Preop testing ordered. Instructions reviewed, including NPO after midnight.      Rubie Maid, MD [facility]

## 2018-11-11 LAB — CYTOLOGY - PAP
CHLAMYDIA, DNA PROBE: NEGATIVE
DIAGNOSIS: HIGH — AB
HPV 16/18/45 GENOTYPING: NEGATIVE
HPV: DETECTED — AB
Neisseria Gonorrhea: NEGATIVE
Trichomonas: NEGATIVE

## 2018-11-19 ENCOUNTER — Other Ambulatory Visit: Payer: Medicaid Other

## 2018-11-19 ENCOUNTER — Other Ambulatory Visit: Payer: Self-pay

## 2018-11-19 ENCOUNTER — Encounter
Admission: RE | Admit: 2018-11-19 | Discharge: 2018-11-19 | Disposition: A | Discharge status: Home / Self Care | Payer: Managed Care, Other (non HMO) | Source: Ambulatory Visit | Attending: Obstetrics and Gynecology | Admitting: Obstetrics and Gynecology

## 2018-11-19 HISTORY — DX: Malignant (primary) neoplasm, unspecified: C80.1

## 2018-11-19 HISTORY — DX: Gastro-esophageal reflux disease without esophagitis: K21.9

## 2018-11-19 HISTORY — DX: Personal history of Methicillin resistant Staphylococcus aureus infection: Z86.14

## 2018-11-19 HISTORY — DX: Anxiety disorder, unspecified: F41.9

## 2018-11-19 NOTE — Patient Instructions (Addendum)
Your procedure is scheduled on: 11-23-18 MONDAY Report to Same Day Surgery 2nd floor medical mall [facility] Entrance-take elevator on left to 2nd floor.  Check in with surgery information desk.) To find out your arrival time please call [phone] between 1PM - 3PM on 3-6-2- FRIDAY  Remember: Instructions that are not followed completely may result in serious medical risk, up to and including death, or upon the discretion of your surgeon and anesthesiologist your surgery may need to be rescheduled.    _x___ 1. Do not eat food after midnight the night before your procedure. NO GUM OR CANDY AFTER MIDNIGHT. You may drink clear liquids up to 2 hours before you are scheduled to arrive at the hospital for your procedure.  Do not drink clear liquids within 2 hours of your scheduled arrival to the hospital.  Clear liquids include  --Water or Apple juice without pulp  --Clear carbohydrate beverage such as ClearFast or Gatorade  --Black Coffee or Clear Tea (No milk, no creamers, do not add anything to the coffee or Tea   ____Ensure clear carbohydrate drink on the way to the hospital for bariatric patients  _X___Ensure clear carbohydrate drink 3 hours before surgery    __x__ 2. No Alcohol for 24 hours before or after surgery.   __x__3. No Smoking or e-cigarettes for 24 prior to surgery.  Do not use any chewable tobacco products for at least 6 hour prior to surgery   ____  4. Bring all medications with you on the day of surgery if instructed.    __x__ 5. Notify your doctor if there is any change in your medical condition     (cold, fever, infections).    x___6. On the morning of surgery brush your teeth with toothpaste and water.  You may rinse your mouth with mouth wash if you wish.  Do not swallow any toothpaste or mouthwash.   Do not wear jewelry, make-up, hairpins, clips or nail polish.  Do not wear lotions, powders, or perfumes. You may wear deodorant.  Do not shave 48 hours prior to  surgery. Men may shave face and neck.  Do not bring valuables to the hospital.    [facility] is not responsible for any belongings or valuables.               Contacts, dentures or bridgework may not be worn into surgery.  Leave your suitcase in the car. After surgery it may be brought to your room.  For patients admitted to the hospital, discharge time is determined by your treatment team.  _  Patients discharged the day of surgery will not be allowed to drive home.  You will need someone to drive you home and stay with you the night of your procedure.    Please read over the following fact sheets that you were given:   [facility] Preparing for Surgery  _x___ TAKE THE FOLLOWING MEDICATION THE MORNING OF SURGERY WITH A SMALL SIP OF WATER. These include:  1. CELEXA  2.  3.  4.  5.  6.  ____Fleets enema or Magnesium Citrate as directed.   _x___ Use CHG Soap or sage wipes as directed on instruction sheet   ____ Use inhalers on the day of surgery and bring to hospital day of surgery  ____ Stop Metformin and Janumet 2 days prior to surgery.    ____ Take 1/2 of usual insulin dose the night before surgery and none on the morning surgery.   ____  Follow recommendations from Cardiologist, Pulmonologist or PCP regarding stopping Aspirin, Coumadin, Plavix ,Eliquis, Effient, or Pradaxa, and Pletal.  X____Stop Anti-inflammatories such as Advil, Aleve, Ibuprofen, Motrin, Naproxen, Naprosyn, Goodies powders or aspirin products NOW-OK to take Tylenol    ____ Stop supplements until after surgery.    ____ Bring C-Pap to the hospital.

## 2018-11-20 ENCOUNTER — Other Ambulatory Visit: Payer: Self-pay | Admitting: Obstetrics and Gynecology

## 2018-11-20 ENCOUNTER — Other Ambulatory Visit: Payer: Self-pay

## 2018-11-20 LAB — CBC
HEMATOCRIT: 35.9 % (ref 34.0–46.6)
HEMOGLOBIN: 12.4 g/dL (ref 11.1–15.9)
MCH: 30.1 pg (ref 26.6–33.0)
MCHC: 34.5 g/dL (ref 31.5–35.7)
MCV: 87 fL (ref 79–97)
Platelets: 339 10*3/uL (ref 150–450)
RBC: 4.12 x10E6/uL (ref 3.77–5.28)
RDW: 13.5 % (ref 11.7–15.4)
WBC: 6.1 10*3/uL (ref 3.4–10.8)

## 2018-11-22 ENCOUNTER — Encounter: Payer: Self-pay | Admitting: Anesthesiology

## 2018-11-23 ENCOUNTER — Ambulatory Visit: Payer: Managed Care, Other (non HMO) | Admitting: Anesthesiology

## 2018-11-23 ENCOUNTER — Encounter
Admission: RE | Disposition: A | Discharge status: Home / Self Care | Payer: Self-pay | Source: Ambulatory Visit | Attending: Obstetrics and Gynecology

## 2018-11-23 ENCOUNTER — Other Ambulatory Visit: Payer: Self-pay

## 2018-11-23 ENCOUNTER — Ambulatory Visit
Admission: RE | Admit: 2018-11-23 | Discharge: 2018-11-23 | Disposition: A | Discharge status: Home / Self Care | Payer: Managed Care, Other (non HMO) | Source: Ambulatory Visit | Attending: Obstetrics and Gynecology | Admitting: Obstetrics and Gynecology

## 2018-11-23 DIAGNOSIS — Z9049 Acquired absence of other specified parts of digestive tract: Secondary | ICD-10-CM | POA: Diagnosis not present

## 2018-11-23 DIAGNOSIS — Z881 Allergy status to other antibiotic agents status: Secondary | ICD-10-CM | POA: Diagnosis not present

## 2018-11-23 DIAGNOSIS — Z833 Family history of diabetes mellitus: Secondary | ICD-10-CM | POA: Diagnosis not present

## 2018-11-23 DIAGNOSIS — E669 Obesity, unspecified: Secondary | ICD-10-CM | POA: Insufficient documentation

## 2018-11-23 DIAGNOSIS — Z8379 Family history of other diseases of the digestive system: Secondary | ICD-10-CM | POA: Diagnosis not present

## 2018-11-23 DIAGNOSIS — Z880 Allergy status to penicillin: Secondary | ICD-10-CM | POA: Insufficient documentation

## 2018-11-23 DIAGNOSIS — D069 Carcinoma in situ of cervix, unspecified: Secondary | ICD-10-CM | POA: Insufficient documentation

## 2018-11-23 DIAGNOSIS — Z885 Allergy status to narcotic agent status: Secondary | ICD-10-CM | POA: Insufficient documentation

## 2018-11-23 DIAGNOSIS — Z87410 Personal history of cervical dysplasia: Secondary | ICD-10-CM | POA: Diagnosis present

## 2018-11-23 DIAGNOSIS — G43909 Migraine, unspecified, not intractable, without status migrainosus: Secondary | ICD-10-CM | POA: Insufficient documentation

## 2018-11-23 DIAGNOSIS — Z793 Long term (current) use of hormonal contraceptives: Secondary | ICD-10-CM | POA: Diagnosis not present

## 2018-11-23 DIAGNOSIS — Z882 Allergy status to sulfonamides status: Secondary | ICD-10-CM | POA: Diagnosis not present

## 2018-11-23 DIAGNOSIS — Z79899 Other long term (current) drug therapy: Secondary | ICD-10-CM | POA: Insufficient documentation

## 2018-11-23 DIAGNOSIS — Z8249 Family history of ischemic heart disease and other diseases of the circulatory system: Secondary | ICD-10-CM | POA: Insufficient documentation

## 2018-11-23 DIAGNOSIS — F419 Anxiety disorder, unspecified: Secondary | ICD-10-CM | POA: Insufficient documentation

## 2018-11-23 DIAGNOSIS — K219 Gastro-esophageal reflux disease without esophagitis: Secondary | ICD-10-CM | POA: Insufficient documentation

## 2018-11-23 DIAGNOSIS — Z9889 Other specified postprocedural states: Secondary | ICD-10-CM

## 2018-11-23 HISTORY — DX: Other specified postprocedural states: Z98.890

## 2018-11-23 HISTORY — PX: CERVICAL CONIZATION W/BX: SHX1330

## 2018-11-23 LAB — POCT PREGNANCY, URINE: Preg Test, Ur: NEGATIVE

## 2018-11-23 SURGERY — CONE BIOPSY, CERVIX
Anesthesia: General

## 2018-11-23 MED ORDER — DIPHENHYDRAMINE HCL 50 MG/ML IJ SOLN
INTRAMUSCULAR | Status: AC
  Filled 2018-11-23: qty 1

## 2018-11-23 MED ORDER — HYDROMORPHONE HCL 1 MG/ML IJ SOLN
0.2500 mg | INTRAMUSCULAR | Status: DC | PRN
  Administered 2018-11-23: 0.25 mg via INTRAVENOUS

## 2018-11-23 MED ORDER — MIDAZOLAM HCL 2 MG/2ML IJ SOLN
INTRAMUSCULAR | Status: AC
  Filled 2018-11-23: qty 2

## 2018-11-23 MED ORDER — LIDOCAINE HCL (PF) 2 % IJ SOLN
INTRAMUSCULAR | Status: AC
  Filled 2018-11-23: qty 10

## 2018-11-23 MED ORDER — PROPOFOL 10 MG/ML IV BOLUS
INTRAVENOUS | Status: AC
  Filled 2018-11-23: qty 20

## 2018-11-23 MED ORDER — FENTANYL CITRATE (PF) 100 MCG/2ML IJ SOLN
INTRAMUSCULAR | Status: AC
  Filled 2018-11-23: qty 2

## 2018-11-23 MED ORDER — LIDOCAINE HCL (PF) 1 % IJ SOLN
INTRAMUSCULAR | Status: DC | PRN
  Administered 2018-11-23: 50 mg

## 2018-11-23 MED ORDER — DIPHENHYDRAMINE HCL 50 MG/ML IJ SOLN
12.5000 mg | Freq: Once | INTRAMUSCULAR | Status: AC
  Administered 2018-11-23: 12.5 mg via INTRAVENOUS

## 2018-11-23 MED ORDER — LACTATED RINGERS IV SOLN: INTRAVENOUS | Status: DC

## 2018-11-23 MED ORDER — ONDANSETRON HCL 4 MG/2ML IJ SOLN
INTRAMUSCULAR | Status: DC | PRN
  Administered 2018-11-23: 4 mg via INTRAVENOUS

## 2018-11-23 MED ORDER — HYDROMORPHONE HCL 1 MG/ML IJ SOLN
INTRAMUSCULAR | Status: AC
  Administered 2018-11-23: 0.25 mg via INTRAVENOUS
  Filled 2018-11-23: qty 1

## 2018-11-23 MED ORDER — PROMETHAZINE HCL 25 MG/ML IJ SOLN
INTRAMUSCULAR | Status: AC
  Administered 2018-11-23: 6.25 mg via INTRAVENOUS
  Filled 2018-11-23: qty 1

## 2018-11-23 MED ORDER — FERRIC SUBSULFATE 259 MG/GM EX SOLN
CUTANEOUS | Status: AC
  Filled 2018-11-23: qty 8

## 2018-11-23 MED ORDER — ACETAMINOPHEN NICU IV SYRINGE 10 MG/ML
INTRAVENOUS | Status: AC
  Filled 2018-11-23: qty 1

## 2018-11-23 MED ORDER — FAMOTIDINE 20 MG PO TABS
20.0000 mg | ORAL_TABLET | Freq: Once | ORAL | Status: AC
  Administered 2018-11-23: 20 mg via ORAL

## 2018-11-23 MED ORDER — FENTANYL CITRATE (PF) 100 MCG/2ML IJ SOLN
25.0000 ug | INTRAMUSCULAR | Status: DC | PRN
  Administered 2018-11-23 (×2): 25 ug via INTRAVENOUS
  Administered 2018-11-23: 50 ug via INTRAVENOUS

## 2018-11-23 MED ORDER — PROPOFOL 10 MG/ML IV BOLUS
INTRAVENOUS | Status: DC | PRN
  Administered 2018-11-23: 200 mg via INTRAVENOUS

## 2018-11-23 MED ORDER — ONDANSETRON HCL 4 MG/2ML IJ SOLN
INTRAMUSCULAR | Status: AC
  Filled 2018-11-23: qty 2

## 2018-11-23 MED ORDER — DEXAMETHASONE SODIUM PHOSPHATE 10 MG/ML IJ SOLN
INTRAMUSCULAR | Status: DC | PRN
  Administered 2018-11-23: 4 mg via INTRAVENOUS

## 2018-11-23 MED ORDER — IODINE STRONG (LUGOLS) 5 % PO SOLN
ORAL | Status: AC
  Filled 2018-11-23: qty 1

## 2018-11-23 MED ORDER — OXYCODONE-ACETAMINOPHEN 5-325 MG PO TABS
1.0000 | ORAL_TABLET | Freq: Once | ORAL | Status: AC
  Administered 2018-11-23: 1 via ORAL

## 2018-11-23 MED ORDER — GLYCOPYRROLATE 0.2 MG/ML IJ SOLN
INTRAMUSCULAR | Status: DC | PRN
  Administered 2018-11-23: 0.2 mg via INTRAVENOUS

## 2018-11-23 MED ORDER — OXYCODONE-ACETAMINOPHEN 5-325 MG PO TABS
ORAL_TABLET | ORAL | Status: AC
  Filled 2018-11-23: qty 1

## 2018-11-23 MED ORDER — ONDANSETRON HCL 4 MG/2ML IJ SOLN: 4.0000 mg | Freq: Once | INTRAMUSCULAR | Status: DC | PRN

## 2018-11-23 MED ORDER — FAMOTIDINE 20 MG PO TABS
ORAL_TABLET | ORAL | Status: AC
  Filled 2018-11-23: qty 1

## 2018-11-23 MED ORDER — MIDAZOLAM HCL 2 MG/2ML IJ SOLN
INTRAMUSCULAR | Status: DC | PRN
  Administered 2018-11-23: 2 mg via INTRAVENOUS

## 2018-11-23 MED ORDER — DEXAMETHASONE SODIUM PHOSPHATE 4 MG/ML IJ SOLN
INTRAMUSCULAR | Status: AC
  Filled 2018-11-23: qty 1

## 2018-11-23 MED ORDER — FENTANYL CITRATE (PF) 100 MCG/2ML IJ SOLN
INTRAMUSCULAR | Status: AC
  Administered 2018-11-23: 50 ug via INTRAVENOUS
  Filled 2018-11-23: qty 2

## 2018-11-23 MED ORDER — IODINE STRONG (LUGOLS) 5 % PO SOLN
ORAL | Status: DC | PRN
  Administered 2018-11-23: 5 mL via ORAL

## 2018-11-23 MED ORDER — LIDOCAINE HCL (PF) 1 % IJ SOLN
INTRAMUSCULAR | Status: DC | PRN
  Administered 2018-11-23: 10 mL

## 2018-11-23 MED ORDER — FENTANYL CITRATE (PF) 100 MCG/2ML IJ SOLN
INTRAMUSCULAR | Status: DC | PRN
  Administered 2018-11-23: 25 ug via INTRAVENOUS
  Administered 2018-11-23 (×3): 50 ug via INTRAVENOUS
  Administered 2018-11-23: 25 ug via INTRAVENOUS

## 2018-11-23 MED ORDER — FERRIC SUBSULFATE SOLN: Status: DC | PRN

## 2018-11-23 MED ORDER — OXYCODONE-ACETAMINOPHEN 5-325 MG PO TABS: 1.0000 | ORAL_TABLET | Freq: Four times a day (QID) | ORAL | 0 refills | Status: DC | PRN

## 2018-11-23 MED ORDER — PROMETHAZINE HCL 25 MG/ML IJ SOLN
6.2500 mg | INTRAMUSCULAR | Status: DC | PRN
  Administered 2018-11-23: 6.25 mg via INTRAVENOUS

## 2018-11-23 MED ORDER — SEVOFLURANE IN SOLN
RESPIRATORY_TRACT | Status: AC
  Filled 2018-11-23: qty 250

## 2018-11-23 MED ORDER — ACETAMINOPHEN 10 MG/ML IV SOLN
INTRAVENOUS | Status: DC | PRN
  Administered 2018-11-23: 1000 mg via INTRAVENOUS

## 2018-11-23 MED ORDER — LIDOCAINE HCL (PF) 1 % IJ SOLN
INTRAMUSCULAR | Status: AC
  Filled 2018-11-23: qty 30

## 2018-11-23 MED ORDER — IBUPROFEN 800 MG PO TABS: 800.0000 mg | ORAL_TABLET | Freq: Three times a day (TID) | ORAL | 1 refills | Status: DC | PRN

## 2018-11-23 MED ORDER — LACTATED RINGERS IV SOLN
INTRAVENOUS | Status: DC
  Administered 2018-11-23: 10:00:00 via INTRAVENOUS

## 2018-11-23 MED ORDER — GLYCOPYRROLATE 0.2 MG/ML IJ SOLN
INTRAMUSCULAR | Status: AC
  Filled 2018-11-23: qty 1

## 2018-11-23 SURGICAL SUPPLY — 32 items
APPLICATOR COTTON TIP 6 STRL (MISCELLANEOUS) ×1 IMPLANT
APPLICATOR COTTON TIP 6IN STRL (MISCELLANEOUS) ×3
APPLICATOR SWAB PROCTO LG 16IN (MISCELLANEOUS) ×6 IMPLANT
BLADE SURG 15 STRL LF DISP TIS (BLADE) ×1 IMPLANT
BLADE SURG 15 STRL SS (BLADE) ×2
CATH ROBINSON RED A/P 16FR (CATHETERS) ×3 IMPLANT
COVER WAND RF STERILE (DRAPES) ×3 IMPLANT
CUP MEDICINE 2OZ PLAST GRAD ST (MISCELLANEOUS) ×3 IMPLANT
DRAPE PERI LITHO V/GYN (MISCELLANEOUS) ×3 IMPLANT
DRAPE UNDER BUTTOCK W/FLU (DRAPES) ×3 IMPLANT
DRESSING SURGICEL FIBRLLR 1X2 (HEMOSTASIS) ×1 IMPLANT
DRSG SURGICEL FIBRILLAR 1X2 (HEMOSTASIS) ×3
DRSG TELFA 4X3 1S NADH ST (GAUZE/BANDAGES/DRESSINGS) ×3 IMPLANT
ELECT REM PT RETURN 9FT ADLT (ELECTROSURGICAL) ×3
ELECTRODE REM PT RTRN 9FT ADLT (ELECTROSURGICAL) ×1 IMPLANT
GLOVE BIO SURGEON STRL SZ 6.5 (GLOVE) ×2 IMPLANT
GLOVE BIO SURGEONS STRL SZ 6.5 (GLOVE) ×1
GLOVE INDICATOR 7.0 STRL GRN (GLOVE) ×3 IMPLANT
GOWN STRL REUS W/ TWL LRG LVL3 (GOWN DISPOSABLE) ×1 IMPLANT
GOWN STRL REUS W/TWL LRG LVL3 (GOWN DISPOSABLE) ×2
HEMOSTAT SURGICEL 2X3 (HEMOSTASIS) ×3 IMPLANT
KIT TURNOVER CYSTO (KITS) ×3 IMPLANT
LABEL OR SOLS (LABEL) ×3 IMPLANT
NEEDLE HYPO 25X1 1.5 SAFETY (NEEDLE) ×3 IMPLANT
PACK BASIN MINOR ARMC (MISCELLANEOUS) ×3 IMPLANT
PAD OB MATERNITY 4.3X12.25 (PERSONAL CARE ITEMS) ×3 IMPLANT
PAD PREP 24X41 OB/GYN DISP (PERSONAL CARE ITEMS) ×3 IMPLANT
SUT ETHIBOND NAB CT1 #1 30IN (SUTURE) ×6 IMPLANT
SUT SILK 2 0 PERMA HAND 18 BK (SUTURE) ×3 IMPLANT
SUT VIC AB 0 CT1 27 (SUTURE) ×2
SUT VIC AB 0 CT1 27XCR 8 STRN (SUTURE) ×1 IMPLANT
SYR 10ML LL (SYRINGE) ×3 IMPLANT

## 2018-11-23 NOTE — Op Note (Signed)
Procedure(s): COLD KNIFE CONIZATION CERVIX WITH BIOPSY Procedure Note  Alexandria Orozco female 33 y.o. 11/23/2018  Indications: The patient is a 33 y.o. [phone] female with adenocarcinoma of the cervix on recent cervical biopsy. Prior history of cervical dysplasia, has a history of recent HGSIL pap smear during pregnancy in 10/2017. 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    Pre-operative Diagnosis: Adenocarcinoma of the cervix  Post-operative Diagnosis: Same  Surgeon: Rubie Maid, MD  Assistants:  None   Anesthesia: General endotracheal anesthesia  Procedure Details: The patient was seen in the Holding Room. The risks, benefits, complications, treatment options, and expected outcomes were discussed with the patient.  The patient concurred with the proposed plan, giving informed consent.  The site of surgery properly noted/marked. The patient was taken to the Operating Room, identified as [place] and the procedure verified as Procedure(s) (LRB): COLD KNIFE CONIZATION CERVIX WITH BIOPSY (N/A).   She was then placed under general anesthesia without difficulty. She was placed in the dorsal lithotomy position, and was prepped and draped in a sterile manner.  A straight catheterization was performed. A sterile speculum was inserted into the vagina and the cervix was grasped at the anterior lip using a single-toothed tenaculum.   Angle stitches of 0-Vicryl sutures were placed at 3 o'clock and 9 o'clock in the lateral vagina fornices. The cervix was stained with Lugol's iodine solution.  After the cervix was stained, a scalpel was used to excise a cone shaped biopsy circumferentially around the cervical os. The specimen was removed intact. A Kevorkian curette was used to obtain endocervical curettings. The cone biopsy site was sutured using interrupted sutures of 0-Vicryl suture. Upon completion of the suture placement, the  endocervical canal was sounded to assure patency. The roller ball was used for cautery of the excised areas that were noted to continue to have some oozing. A prophylactic application of Monsel's solution was placed for hemostasis.  Slight oozing still noted, Fibrillar placed with hemostasis achieved.  The cervix was injected circumferentially with 1% Xylocaine for pain relief. This completed the procedure.  Findings:  During the examination, under anesthesia, the vulva, vagina, and cervix were grossly unremarkable. The uterus was smooth with no palpable cervical nodularity and no adnexal masses were noted.  Estimated Blood Loss:  400 ml      Drains: straight catheterization prior to procedure with  200 ml of clear urine         Total IV Fluids:  700 ml  Specimens: Conization specimen of cervix, Endocervical curettage         Implants: None         Complications:  None; patient tolerated the procedure well.         Disposition: PACU - hemodynamically stable.         Condition: stable   Rubie Maid, MD [facility]

## 2018-11-23 NOTE — Anesthesia Preprocedure Evaluation (Signed)
Anesthesia Evaluation  Patient identified by MRN, date of birth, ID band Patient awake    Reviewed: Allergy & Precautions, NPO status , Patient's Chart, lab work & pertinent test results, reviewed documented beta blocker date and time   Airway Mallampati: III  TM Distance: >3 FB     Dental  (+) Chipped   Pulmonary           Cardiovascular      Neuro/Psych  Headaches, Anxiety    GI/Hepatic GERD  ,  Endo/Other    Renal/GU      Musculoskeletal   Abdominal   Peds  Hematology   Anesthesia Other Findings Obese. EKG ok.  Reproductive/Obstetrics                             Anesthesia Physical Anesthesia Plan  ASA: II  Anesthesia Plan: General   Post-op Pain Management:    Induction: Intravenous  PONV Risk Score and Plan:   Airway Management Planned: LMA  Additional Equipment:   Intra-op Plan:   Post-operative Plan:   Informed Consent: I have reviewed the patients History and Physical, chart, labs and discussed the procedure including the risks, benefits and alternatives for the proposed anesthesia with the patient or authorized representative who has indicated his/her understanding and acceptance.       Plan Discussed with: CRNA  Anesthesia Plan Comments:         Anesthesia Quick Evaluation

## 2018-11-23 NOTE — Transfer of Care (Signed)
Immediate Anesthesia Transfer of Care Note  Patient: Alexandria Orozco  Procedure(s) Performed: COLD KNIFE CONIZATION CERVIX WITH BIOPSY (N/A )  Patient Location: PACU  Anesthesia Type:General  Level of Consciousness: drowsy  Airway & Oxygen Therapy: Patient Spontanous Breathing and Patient connected to face mask oxygen  Post-op Assessment: Report given to RN and Post -op Vital signs reviewed and stable  Post vital signs: Reviewed and stable  Last Vitals:  Vitals Value Taken Time  BP 109/68 11/23/2018 11:37 AM  Temp 36.6 C 11/23/2018 11:37 AM  Pulse 62 11/23/2018 11:38 AM  Resp 16 11/23/2018 11:38 AM  SpO2 100 % 11/23/2018 11:38 AM  Vitals shown include unvalidated device data.  Last Pain:  Vitals:   11/23/18 0918  TempSrc: Tympanic  PainSc: 0-No pain         Complications: No apparent anesthesia complications

## 2018-11-23 NOTE — Anesthesia Procedure Notes (Signed)
Procedure Name: LMA Insertion Date/Time: 11/23/2018 10:07 AM Performed by: Johnna Acosta, CRNA Pre-anesthesia Checklist: Patient identified, Emergency Drugs available, Suction available, Patient being monitored and Timeout performed Patient Re-evaluated:Patient Re-evaluated prior to induction Oxygen Delivery Method: Circle system utilized Preoxygenation: Pre-oxygenation with 100% oxygen Induction Type: IV induction Ventilation: Mask ventilation without difficulty LMA: LMA inserted LMA Size: 3.5 Tube type: Oral Number of attempts: 1 Airway Equipment and Method: Oral airway Placement Confirmation: positive ETCO2 and breath sounds checked- equal and bilateral Tube secured with: Tape Dental Injury: Teeth and Oropharynx as per pre-operative assessment

## 2018-11-23 NOTE — Anesthesia Postprocedure Evaluation (Signed)
Anesthesia Post Note  Patient: Alexandria Orozco  Procedure(s) Performed: COLD KNIFE CONIZATION CERVIX WITH BIOPSY (N/A )  Patient location during evaluation: PACU Anesthesia Type: General Level of consciousness: awake and alert Pain management: pain level controlled Vital Signs Assessment: post-procedure vital signs reviewed and stable Respiratory status: spontaneous breathing, nonlabored ventilation, respiratory function stable and patient connected to nasal cannula oxygen Cardiovascular status: blood pressure returned to baseline and stable Postop Assessment: no apparent nausea or vomiting Anesthetic complications: no     Last Vitals:  Vitals:   11/23/18 1337 11/23/18 1339  BP: (!) 102/59   Pulse: 63   Resp: 16   Temp: 37.4 C 36.9 C  SpO2: 100%     Last Pain:  Vitals:   11/23/18 1339  TempSrc: Oral  PainSc:                  Gable Odonohue S

## 2018-11-23 NOTE — OR Nursing (Signed)
Called pharmacist regarding allergy to Morphine and possible crossover reaction to Percocet.  "The risk is low, OK to give." per Myra, pharmacist.

## 2018-11-23 NOTE — Anesthesia Post-op Follow-up Note (Signed)
Anesthesia QCDR form completed.        

## 2018-11-23 NOTE — Interval H&P Note (Signed)
History and Physical Interval Note:  11/23/2018 9:31 AM  Alexandria Orozco  has presented today for surgery, with the diagnosis of CARCINOMA IN SITU OF EXOCERVIX.  The various methods of treatment have been discussed with the patient and family. After consideration of risks, benefits and other options for treatment, the patient has consented to  Procedure(s): COLD KNIFE CONIZATION CERVIX WITH BIOPSY (N/A) as a surgical intervention.  The patient's history has been reviewed, patient examined, no change in status, stable for surgery.  I have reviewed the patient's chart and labs.  Questions were answered to the patient's satisfaction.     Rubie Maid, MD [facility]

## 2018-11-23 NOTE — Discharge Instructions (Addendum)
Cervical Conization, Care After This sheet gives you information about how to care for yourself after your procedure. Your health care provider may also give you more specific instructions. If you have problems or questions, contact your health care provider. What can I expect after the procedure? After the procedure, it is common to have:  A groggy feeling, if you were given medicine to make you fall asleep (general anesthetic).  Cramps that feel similar to menstrual cramps.  Bloody discharge or light to moderate bleeding.  Dark discharge. This discharge may look similar to coffee grounds. This is from the paste that was applied to the cervix to control bleeding. Follow these instructions at home: Medicines   Take over-the-counter and prescription medicines only as told by your health care provider.  Do not take aspirin until your health care provider says it is okay. Aspirin can cause bleeding.  If you are taking pain medicine: ? You may need to prevent or treat constipation. To do this, your health care provider may recommend that you:  Drink enough fluid to keep your urine clear or pale yellow.  Take over-the-counter or prescription medicines.  Eat foods that are high in fiber, such as fresh fruits and vegetables, whole grains, bran, and beans.  Limit foods that are high in fat and processed sugars, such as fried and sweet foods. ? Do not drive or use heavy machinery. General instructions  You may resume your normal diet unless your health care provider advises you not to do so.  Take showers for the first week. Do not take baths, swim, or use hot tubs until your health care provider says it is okay.  Do not douche, use tampons, or have sex until your health care provider says it is okay.  Avoid activities that require great effort, such as exercises and heavy lifting, for at least 7-14 days.  Keep all follow-up visits as told by your health care provider. This is  important. Contact a health care provider if:  You develop a rash.  You are dizzy or lightheaded.  You feel nauseous or you vomit.  You develop a bad smelling discharge from your vagina. Get help right away if:  You have blood clots or bleeding that is heavier than a normal period. Bleeding that soaks a pad in less than 1 hour is considered heavy bleeding.  You have a fever.  You have increasing cramps.  You faint.  You have pain when you urinate.  You have severe or worsening pain.  Your pain is not relieved when you take medicine.  You have bloody urine.  You vomit. Summary  After the procedure, it is common to have cramps and dark or bloody discharge from your vagina.  Do not douche, use tampons, or have sex until your health care provider says it is okay.  Follow all other activity restrictions as told by your health care provider. This information is not intended to replace advice given to you by your health care provider. Make sure you discuss any questions you have with your health care provider. Document Released: 09/02/2005 Document Revised: 09/04/2016 Document Reviewed: 09/04/2016 Elsevier Interactive Patient Education  2019 [place]   1) The drugs that you were given will stay in your system until tomorrow so for the next 24 hours you should not:  A) Drive an automobile B) Make any legal decisions C) Drink any alcoholic beverage   2) You may resume regular meals  tomorrow.  Today it is better to start with liquids and gradually work up to solid foods.  You may eat anything you prefer, but it is better to start with liquids, then soup and crackers, and gradually work up to solid foods.   3) Please notify your doctor immediately if you have any unusual bleeding, trouble breathing, redness and pain at the surgery site, drainage, fever, or pain not relieved by medication.    4) Additional  Instructions:        Please contact your physician with any problems or Same Day Surgery at [phone], Monday through Friday 6 am to 4 pm, or [place] at [facility] number at [phone].

## 2018-11-24 LAB — SURGICAL PATHOLOGY

## 2018-11-30 ENCOUNTER — Telehealth: Payer: Self-pay | Admitting: Obstetrics and Gynecology

## 2018-11-30 NOTE — Telephone Encounter (Signed)
Patient called stating she needs a release note stating she can return to work post surgery. The fax number is [phone] and goes straight to her at work.Thanks

## 2018-11-30 NOTE — Telephone Encounter (Signed)
Yes, she can have a return to work notice.  We can fax it to her job.

## 2018-11-30 NOTE — Telephone Encounter (Signed)
The patient called again and stated that she needs the note faxed to her job immediately, I informed the patient that I would send another message back and that clinical staff will get this done as soon as they possibly can. Please advise.

## 2018-11-30 NOTE — Telephone Encounter (Signed)
Letter printed and faxed to her job.

## 2018-12-11 ENCOUNTER — Telehealth: Payer: Self-pay

## 2018-12-11 NOTE — Telephone Encounter (Signed)
Pt called no answer LM via voicemail to call the office to do her prescreening questions before her visit.

## 2018-12-11 NOTE — Telephone Encounter (Signed)
Spoke with pt and went over COVID-19 prescreening questions. Pt stated that she was not having any of those symptoms. Pt was also informed that she was not allowed to bring anything visitors with her to the office. Pt was also informed that if she was to develop any of those symptoms over the weekend to please call the office before she arrived to her appointment on Monday. Pt stated that she understood.

## 2018-12-14 ENCOUNTER — Ambulatory Visit (INDEPENDENT_AMBULATORY_CARE_PROVIDER_SITE_OTHER): Payer: Self-pay | Admitting: Obstetrics and Gynecology

## 2018-12-14 ENCOUNTER — Other Ambulatory Visit: Payer: Self-pay

## 2018-12-14 ENCOUNTER — Encounter: Payer: Self-pay | Admitting: Obstetrics and Gynecology

## 2018-12-14 VITALS — BP 111/70 | HR 53 | Ht 61.0 in | Wt 169.1 lb

## 2018-12-14 DIAGNOSIS — Z9889 Other specified postprocedural states: Secondary | ICD-10-CM

## 2018-12-14 DIAGNOSIS — D061 Carcinoma in situ of exocervix: Secondary | ICD-10-CM | POA: Insufficient documentation

## 2018-12-14 DIAGNOSIS — Z4889 Encounter for other specified surgical aftercare: Secondary | ICD-10-CM

## 2018-12-14 HISTORY — DX: Other specified postprocedural states: Z98.890

## 2018-12-14 NOTE — Progress Notes (Signed)
Pt is present today for post-op visit. Pt stated that she was doing well, but was still bleeding from the surgery.

## 2018-12-14 NOTE — Progress Notes (Signed)
    OBSTETRICS/GYNECOLOGY POST-OPERATIVE CLINIC VISIT  Subjective:     Alexandria Orozco is a 33 y.o. female who presents to the clinic 3 weeks status post cold knife conization of the cervix for cervical dysplasia (carcinoma in situ). Eating a regular diet without difficulty. Bowel movements are normal. The patient is not having any pain.  She does note that she is still bleeding off and on, but bleeding is very light.    The following portions of the patient's history were reviewed and updated as appropriate: allergies, current medications, past family history, past medical history, past social history, past surgical history and problem list.   Review of Systems Pertinent items noted in HPI and remainder of comprehensive ROS otherwise negative.    Objective:    BP 111/70   Pulse (!) 53   Ht 5\' 1"  (1.549 m)   Wt 169 lb 1.6 oz (76.7 kg)   LMP 11/21/2018   BMI 31.95 kg/m  General:  alert and no distress  Abdomen: soft, bowel sounds active, non-tender  Pelvis:   external genitalia normal. Vagina with scant red-brown watery discharge. Cervix healing well with post-conization changes, no lesions. Bimanual exam not done.     Pathology:  A. UTERINE CERVIX; CONIZATION:  - HIGH-GRADE SQUAMOUS INTRAEPITHELIAL LESION (CIN-3, SEVERE DYSPLASIA)  WITH ENDOCERVICAL GLANDULAR INVOLVEMENT, INVOLVING ALL 4 QUADRANTS.  - HIGH-GRADE DYSPLASIA PRESENT AT CAUTERIZED ECTOCERVICAL MARGIN IN THE  12-3:00 AND 9-12:00 QUADRANTS.  - ENDOCERVICAL MARGIN FREE OF DYSPLASIA.  - NEGATIVE FOR MALIGNANCY.   Assessment:    Doing well postoperatively. 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  CIN III noted on final biopsies.    Plan:   1. Continue any current medications if needed. 2. Wound care discussed.  3. Operative findings again reviewed. Pathology report discussed. 4. Activity restrictions: pelvic rest for an additional week 5. Anticipated return to work: patient has already returned to work.  6. Follow up: 4 -6 months for repeat pap smear.  Will need pap smears q 4-6 months for 1 year.     Rubie Maid, MD [facility]

## 2018-12-15 ENCOUNTER — Encounter: Payer: Self-pay | Admitting: Obstetrics and Gynecology

## 2019-03-23 ENCOUNTER — Telehealth: Payer: Self-pay | Admitting: Obstetrics and Gynecology

## 2019-03-23 ENCOUNTER — Other Ambulatory Visit: Payer: Self-pay

## 2019-03-23 DIAGNOSIS — N912 Amenorrhea, unspecified: Secondary | ICD-10-CM

## 2019-03-23 NOTE — Telephone Encounter (Signed)
Order entered and patient is aware.  Patient request we fax order to [facility] at [phone], order faxed and confirmation received.

## 2019-03-23 NOTE — Telephone Encounter (Signed)
Patient called requesting lab order be put in for her to have the done at the [employer] [facility] where she works. Thanks

## 2019-03-24 ENCOUNTER — Telehealth: Payer: Self-pay | Admitting: Obstetrics and Gynecology

## 2019-03-24 LAB — BETA HCG QUANT (REF LAB): hCG Quant: 744 m[IU]/mL

## 2019-03-24 NOTE — Telephone Encounter (Signed)
Lab is consistent with 5-[redacted] weeks pregnant which is close to LMP dates. Please have her schedule a viability scan within the next week.

## 2019-03-24 NOTE — Telephone Encounter (Signed)
Sent to [facility] to review beta.

## 2019-03-24 NOTE — Telephone Encounter (Signed)
The patient called and stated that she needs to speak with her nurse or provider to check the status of her labwork results. The patient is requesting a call back. Please advise.

## 2019-03-25 NOTE — Telephone Encounter (Signed)
Pt aware. Will check her calendar and call us back.

## 2019-03-31 ENCOUNTER — Other Ambulatory Visit: Payer: Self-pay | Admitting: Obstetrics and Gynecology

## 2019-03-31 ENCOUNTER — Other Ambulatory Visit: Payer: Self-pay

## 2019-03-31 ENCOUNTER — Ambulatory Visit (INDEPENDENT_AMBULATORY_CARE_PROVIDER_SITE_OTHER): Payer: Self-pay

## 2019-03-31 ENCOUNTER — Encounter: Payer: Self-pay | Admitting: Obstetrics and Gynecology

## 2019-03-31 ENCOUNTER — Ambulatory Visit (INDEPENDENT_AMBULATORY_CARE_PROVIDER_SITE_OTHER): Payer: Medicaid Other | Admitting: Obstetrics and Gynecology

## 2019-03-31 ENCOUNTER — Other Ambulatory Visit: Payer: Medicaid Other

## 2019-03-31 DIAGNOSIS — N926 Irregular menstruation, unspecified: Secondary | ICD-10-CM

## 2019-03-31 DIAGNOSIS — R52 Pain, unspecified: Secondary | ICD-10-CM

## 2019-03-31 NOTE — Progress Notes (Signed)
  Subjective:     Patient ID: Alexandria Orozco, female   DOB: 10-29-1985, 33 y.o.   MRN: [id number]  HPI Here for viability scan due to recent missed menses and 744 hcg on 03/23/2019. States she still feels nauseated and tired. Denies any bleeding since LMP (May 25)  which was normal.    Review of Systems  Constitutional: Positive for fatigue.  Gastrointestinal: Positive for nausea.       Objective:   Physical Exam A&Ox4 Well groomed female in no distress   pelvic ultrasound -see chart Assessment:     Missed menses    Plan:     Reviewed findings and concern for miscarriage as no pregnancy or POC noted on ultrasound today. hcg repeated and will follow up with repeat u/s in 1-2 weeks if levels indicated or miscarriage treatment again if labs indicate. Instructed to notify us immediately if developed severe pelvic pain, fever or heavy bleeding.  Chelsey Kimberley,CNM

## 2019-04-01 ENCOUNTER — Other Ambulatory Visit: Payer: Self-pay | Admitting: Obstetrics and Gynecology

## 2019-04-01 ENCOUNTER — Encounter: Payer: Self-pay | Admitting: *Deleted

## 2019-04-01 DIAGNOSIS — N926 Irregular menstruation, unspecified: Secondary | ICD-10-CM

## 2019-04-01 LAB — BETA HCG QUANT (REF LAB): hCG Quant: 501 m[IU]/mL

## 2019-04-06 ENCOUNTER — Emergency Department: Payer: Managed Care, Other (non HMO)

## 2019-04-06 ENCOUNTER — Emergency Department
Admission: EM | Admit: 2019-04-06 | Discharge: 2019-04-07 | Disposition: A | Discharge status: Home / Self Care | Payer: Managed Care, Other (non HMO) | Attending: Emergency Medicine | Admitting: Emergency Medicine

## 2019-04-06 ENCOUNTER — Encounter: Payer: Self-pay | Admitting: Emergency Medicine

## 2019-04-06 DIAGNOSIS — O039 Complete or unspecified spontaneous abortion without complication: Secondary | ICD-10-CM | POA: Insufficient documentation

## 2019-04-06 DIAGNOSIS — Z79899 Other long term (current) drug therapy: Secondary | ICD-10-CM | POA: Insufficient documentation

## 2019-04-06 DIAGNOSIS — Z3A08 8 weeks gestation of pregnancy: Secondary | ICD-10-CM | POA: Insufficient documentation

## 2019-04-06 DIAGNOSIS — N938 Other specified abnormal uterine and vaginal bleeding: Secondary | ICD-10-CM | POA: Diagnosis present

## 2019-04-06 LAB — CBC
HCT: 39.4 % (ref 36.0–46.0)
Hemoglobin: 13.1 g/dL (ref 12.0–15.0)
MCH: 29.6 pg (ref 26.0–34.0)
MCHC: 33.2 g/dL (ref 30.0–36.0)
MCV: 89.1 fL (ref 80.0–100.0)
Platelets: 349 10*3/uL (ref 150–400)
RBC: 4.42 MIL/uL (ref 3.87–5.11)
RDW: 14.3 % (ref 11.5–15.5)
WBC: 9.9 10*3/uL (ref 4.0–10.5)
nRBC: 0 % (ref 0.0–0.2)

## 2019-04-06 LAB — HCG, QUANTITATIVE, PREGNANCY: hCG, Beta Chain, Quant, S: 513 m[IU]/mL — ABNORMAL HIGH (ref <5)

## 2019-04-06 LAB — ABO/RH: ABO/RH(D): O POS

## 2019-04-06 NOTE — ED Triage Notes (Signed)
Pt c/o vaginal bleeding, minimal amount on pads, that started today. Pt is approx. [redacted] weeks pregnant.

## 2019-04-07 MED ORDER — OXYCODONE-ACETAMINOPHEN 5-325 MG PO TABS
1.0000 | ORAL_TABLET | Freq: Once | ORAL | Status: AC
  Administered 2019-04-07: 1 via ORAL
  Filled 2019-04-07: qty 1

## 2019-04-07 MED ORDER — OXYCODONE-ACETAMINOPHEN 5-325 MG PO TABS: 1.0000 | ORAL_TABLET | ORAL | 0 refills | Status: DC | PRN

## 2019-04-07 MED ORDER — OXYCODONE-ACETAMINOPHEN 5-325 MG PO TABS: 1.0000 | ORAL_TABLET | Freq: Once | ORAL | Status: DC

## 2019-04-07 NOTE — ED Provider Notes (Signed)
[facility] Emergency Department Provider Note ____   First MD Initiated Contact with Patient 04/06/19 2345     (approximate)  I have reviewed the triage vital signs and the nursing notes.   HISTORY  Chief Complaint Vaginal Bleeding   HPI Alexandria Orozco is a 33 y.o. female G4, P3 approximately 8 to [redacted] weeks pregnant presents to the emergency department secondary to vaginal bleeding.  Patient describes the bleeding as mild.  Patient states that she had considerable cramps earlier with considerable pain however at present current pain score is 2 out of 10.  Patient denies any fever.  Patient denies any urinary symptoms.        Past Medical History:  Diagnosis Date  . Anxiety   . Cancer ([place])   . GERD (gastroesophageal reflux disease)    WITH PREGNANCY ONLY  . History of methicillin resistant staphylococcus aureus (MRSA) 2018  . Migraines    MIGRAINES  . S/P conization of cervix 11/23/2018  . S/P LEEP 12/14/2018    Patient Active Problem List   Diagnosis Date Noted  . 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 S/P conization of cervix 11/23/2018  . Abdominal pain, epigastric   . Gastritis without bleeding   . Abdominal pain 07/20/2017  . Syncope and collapse 04/10/2016    Past Surgical History:  Procedure Laterality Date  . CERVICAL CONIZATION W/BX N/A 11/23/2018   Procedure: COLD KNIFE CONIZATION CERVIX WITH BIOPSY;  Surgeon: Rubie Maid, MD;  Location: [facility] ORS;  Service: Gynecology;  Laterality: N/A;  . CHOLECYSTECTOMY    . ESOPHAGOGASTRODUODENOSCOPY (EGD) WITH PROPOFOL N/A 08/06/2017   Procedure: ESOPHAGOGASTRODUODENOSCOPY (EGD) WITH PROPOFOL;  Surgeon: Lucilla Lame, MD;  Location: [place];  Service: Endoscopy;  Laterality: N/A;  . TONSILLECTOMY      Prior to Admission medications   Medication Sig Start Date End Date Taking? Authorizing Provider  citalopram (CELEXA) 20 MG tablet Take 1 tablet (20 mg total) by mouth daily.  Patient taking differently: Take 20 mg by mouth every morning. WHEN PT REMEMBERS 06/08/18   Philip Aspen, CNM  ibuprofen (ADVIL,MOTRIN) 800 MG tablet Take 1 tablet (800 mg total) by mouth every 8 (eight) hours as needed. 11/23/18   Rubie Maid, MD  norethindrone-ethinyl estradiol (JUNEL FE,GILDESS FE,LOESTRIN FE) 1-20 MG-MCG tablet Take 1 tablet by mouth daily. 05/29/18   Philip Aspen, CNM    Allergies Ampicillin, Morphine and related, Penicillins, and Sulfa antibiotics  Family History  Problem Relation Age of Onset  . Diabetes Mother   . Heart attack Father   . Diabetes Paternal Uncle   . Diabetes Maternal Grandmother   . Pancreatitis Maternal Grandmother     Social History Social History   Tobacco Use  . Smoking status: Never Smoker  . Smokeless tobacco: Never Used  Substance Use Topics  . Alcohol use: No  . Drug use: No    Review of Systems Constitutional: No fever/chills Eyes: No visual changes. ENT: No sore throat. Cardiovascular: Denies chest pain. Respiratory: Denies shortness of breath. Gastrointestinal: No abdominal pain.  No nausea, no vomiting.  No diarrhea.  No constipation. Genitourinary: Negative for dysuria.  Positive for vaginal bleeding Musculoskeletal: Negative for neck pain.  Negative for back pain. Integumentary: Negative for rash. Neurological: Negative for headaches, focal weakness or numbness.   ____________________________________________   PHYSICAL EXAM:  VITAL SIGNS: ED Triage Vitals  Enc Vitals Group     BP 04/06/19 1937 131/70     Pulse Rate  04/06/19 1937 82     Resp 04/06/19 1937 18     Temp 04/06/19 1937 98.7 F (37.1 C)     Temp Source 04/06/19 1937 Oral     SpO2 04/06/19 1937 100 %     Weight 04/06/19 1941 76.2 kg (168 lb)     Height --      Head Circumference --      Peak Flow --      Pain Score 04/06/19 2340 0     Pain Loc --      Pain Edu? --      Excl. in [place]? --     Constitutional: Alert and oriented. Well  appearing and in no acute distress. Eyes: Conjunctivae are normal.  Mouth/Throat: Mucous membranes are moist.  Oropharynx non-erythematous. Neck: No stridor.   Cardiovascular: Normal rate, regular rhythm. Good peripheral circulation. Grossly normal heart sounds. Respiratory: Normal respiratory effort.  No retractions. No audible wheezing. Gastrointestinal: Soft and nontender. No distention.  Musculoskeletal: No lower extremity tenderness nor edema. No gross deformities of extremities. Neurologic:  Normal speech and language. No gross focal neurologic deficits are appreciated.  Skin:  Skin is warm, dry and intact. No rash noted. Psychiatric: Mood and affect are normal. Speech and behavior are normal.  ____________________________________________   LABS (all labs ordered are listed, but only abnormal results are displayed)  Labs Reviewed  HCG, QUANTITATIVE, PREGNANCY - Abnormal; Notable for the following components:      Result Value   hCG, Beta Chain, Quant, S 513 (*)    All other components within normal limits  CBC  ABO/RH   _________________________  RADIOLOGY I, Ranshaw, personally viewed and evaluated these images (plain radiographs) as part of my medical decision making, as well as reviewing the written report by the radiologist.  ED MD interpretation: No intrauterine pregnancy identified on ultrasound large blood clot noted in the endometrium consistent with a spontaneous abortion per radiologist on ultrasound OB rotation.  Official radiology report(s): US Ob Comp Less 14 Wks  Result Date: 04/06/2019 CLINICAL DATA:  32 year female with positive HCG level presenting with vaginal bleeding. LMP: 02/08/2019 corresponding to an estimated gestational age of [redacted] weeks, 1 day. EXAM: OBSTETRIC <14 WK [place] AND TRANSVAGINAL OB US TECHNIQUE: Both transabdominal and transvaginal ultrasound examinations were performed for complete evaluation of the gestation as well as the  maternal uterus, adnexal regions, and pelvic cul-de-sac. Transvaginal technique was performed to assess early pregnancy. COMPARISON:  Ultrasound dated 03/31/2019 FINDINGS: The uterus is anteverted. No intrauterine pregnancy identified. There is a large complex predominantly echogenic and solid collection in the endometrium and lower uterine segment measuring approximately 5.0 x 2.0 x 1.6 cm. No significant internal flow identified within this collection. Some flow may be present in the superior aspect of this collection. This is most consistent with blood clot. The ovaries are unremarkable. The right ovary measures 2.8 x 1.6 x 2.3 cm and the left ovary measures 3.4 x 2.1 x 2.1 cm. IMPRESSION: No intrauterine pregnancy identified. Large blood clot within the endometrium most consistent with spontaneous abortion. Please note, in the absence of documented IUP by imaging, the possibility of an ectopic pregnancy cannot be excluded. Clinical correlation and follow-up with serial HCG levels and repeat ultrasound in 7 days, or earlier if clinically indicated, recommended. Electronically Signed   By: Anner Crete M.D.   On: 04/06/2019 20:27   US Ob Transvaginal  Result Date: 04/06/2019 CLINICAL DATA:  33 year female with  positive HCG level presenting with vaginal bleeding. LMP: 02/08/2019 corresponding to an estimated gestational age of [redacted] weeks, 1 day. EXAM: OBSTETRIC <14 WK [place] AND TRANSVAGINAL OB US TECHNIQUE: Both transabdominal and transvaginal ultrasound examinations were performed for complete evaluation of the gestation as well as the maternal uterus, adnexal regions, and pelvic cul-de-sac. Transvaginal technique was performed to assess early pregnancy. COMPARISON:  Ultrasound dated 03/31/2019 FINDINGS: The uterus is anteverted. No intrauterine pregnancy identified. There is a large complex predominantly echogenic and solid collection in the endometrium and lower uterine segment measuring approximately  5.0 x 2.0 x 1.6 cm. No significant internal flow identified within this collection. Some flow may be present in the superior aspect of this collection. This is most consistent with blood clot. The ovaries are unremarkable. The right ovary measures 2.8 x 1.6 x 2.3 cm and the left ovary measures 3.4 x 2.1 x 2.1 cm. IMPRESSION: No intrauterine pregnancy identified. Large blood clot within the endometrium most consistent with spontaneous abortion. Please note, in the absence of documented IUP by imaging, the possibility of an ectopic pregnancy cannot be excluded. Clinical correlation and follow-up with serial HCG levels and repeat ultrasound in 7 days, or earlier if clinically indicated, recommended. Electronically Signed   By: Anner Crete M.D.   On: 04/06/2019 20:27     Procedures   ____________________________________________   INITIAL IMPRESSION / MDM / [place] / ED COURSE  As part of my medical decision making, I reviewed the following data within the electronic MEDICAL RECORD NUMBER   33 year old female presented with above-stated history and physical exam concerning for possible impending/threatened miscarriage.  Patient hCG quantitative was 744 on July 7 and subsequently 501 on July 15 today patient's quantitative is 513.  Patient is O+.  Patient has been seen by [facility] and as such patient was discussed with Dr. Marcelline Mates for outpatient follow-up secondary to spontaneous abortion.  Patient advised of warning signs I would warrant immediate return to the emergency department.     ____________________________________________  FINAL CLINICAL IMPRESSION(S) / ED DIAGNOSES  Final diagnoses:  Spontaneous abortion     MEDICATIONS GIVEN DURING THIS VISIT:  Medications  oxyCODONE-acetaminophen (PERCOCET/ROXICET) 5-325 MG per tablet 1 tablet (has no administration in time range)     ED Discharge Orders    None      *Please note:  Alexandria Orozco was evaluated in  Emergency Department on 04/07/2019 for the symptoms described in the history of present illness. She was evaluated in the context of the global COVID-19 pandemic, which necessitated consideration that the patient might be at risk for infection with the SARS-CoV-2 virus that causes COVID-19. Institutional protocols and algorithms that pertain to the evaluation of patients at risk for COVID-19 are in a state of rapid change based on information released by regulatory bodies including the CDC and federal and state organizations. These policies and algorithms were followed during the patient's care in the ED.  Some ED evaluations and interventions may be delayed as a result of limited staffing during the pandemic.*  Note:  This document was prepared using Dragon voice recognition software and may include unintentional dictation errors.   Gregor Hams, MD 04/07/19 [phone]

## 2019-04-12 ENCOUNTER — Ambulatory Visit (INDEPENDENT_AMBULATORY_CARE_PROVIDER_SITE_OTHER): Payer: Managed Care, Other (non HMO) | Admitting: Certified Nurse Midwife

## 2019-04-12 ENCOUNTER — Other Ambulatory Visit: Payer: Self-pay

## 2019-04-12 ENCOUNTER — Other Ambulatory Visit: Payer: Self-pay | Admitting: Certified Nurse Midwife

## 2019-04-12 ENCOUNTER — Encounter: Payer: Self-pay | Admitting: Certified Nurse Midwife

## 2019-04-12 VITALS — BP 111/74 | HR 58 | Temp 99.2°F | Ht 60.0 in | Wt 171.8 lb

## 2019-04-12 DIAGNOSIS — O039 Complete or unspecified spontaneous abortion without complication: Secondary | ICD-10-CM

## 2019-04-12 NOTE — Progress Notes (Signed)
GYN ENCOUNTER NOTE  Subjective:       Alexandria Orozco is a 33 y.o. [phone] female here for ER follow up regarding spontaneous miscarriage.   Seen at [facility] ER on 04/06/2019. Reports intermittent vaginal bleeding without clots from day of ER visit until yesterday.   Denies difficulty breathing or respiratory distress, chest pain, abdominal pain, excessive vaginal bleeding, dysuria, and leg pain or swelling.    Gynecologic History  Patient's last menstrual period was 02/08/2019 (exact date). Period Cycle (Days): 28 Period Duration (Days): 5 Period Pattern: Regular Menstrual Flow: Moderate, Light Menstrual Control: Tampon, Maxi pad Dysmenorrhea: (!) Mild Dysmenorrhea Symptoms: Cramping  Contraception: none  Last Pap: 10/2017. Results were: HSIL; LEEP on 11/23/2018  Obstetric History  OB History  Gravida Para Term Preterm AB Living  4 3 1 2 1 3   SAB TAB Ectopic Multiple Live Births  1     0 3    # Outcome Date GA Lbr Len/2nd Weight Sex Delivery Anes PTL Lv  4 SAB 04/06/19          3 Preterm 04/06/18 [redacted]w[redacted]d / 00:05 6 lb 7.7 oz (2.94 kg) M Vag-Spont EPI  LIV  2 Term 08/08/16   6 lb 9 oz (2.977 kg) F Vag-Spont EPI N LIV  1 Preterm 07/10/07   5 lb 9 oz (2.523 kg) M Vag-Spont EPI Y LIV    Past Medical History:  Diagnosis Date  . Anxiety   . Cancer ([place])   . GERD (gastroesophageal reflux disease)    WITH PREGNANCY ONLY  . History of methicillin resistant staphylococcus aureus (MRSA) 2018  . Migraines    MIGRAINES  . S/P conization of cervix 11/23/2018  . S/P LEEP 12/14/2018    Past Surgical History:  Procedure Laterality Date  . CERVICAL CONIZATION W/BX N/A 11/23/2018   Procedure: COLD KNIFE CONIZATION CERVIX WITH BIOPSY;  Surgeon: Rubie Maid, MD;  Location: [facility]S;  Service: Gynecology;  Laterality: N/A;  . CHOLECYSTECTOMY    . ESOPHAGOGASTRODUODENOSCOPY (EGD) WITH PROPOFOL N/A 08/06/2017   Procedure: ESOPHAGOGASTRODUODENOSCOPY (EGD) WITH PROPOFOL;  Surgeon: Lucilla Lame, MD;  Location: [place];  Service: Endoscopy;  Laterality: N/A;  . TONSILLECTOMY      No current outpatient medications on file prior to visit.   No current facility-administered medications on file prior to visit.     Allergies  Allergen Reactions  . Ampicillin Rash    Did it involve swelling of the face/tongue/throat, SOB, or low BP? No Did it involve sudden or severe rash/hives, skin peeling, or any reaction on the inside of your mouth or nose? No Did you need to seek medical attention at a hospital or doctor's office? No When did it last happen?3-4 years ago If all above answers are "NO", may proceed with cephalosporin use.  Marland Kitchen Morphine And Related Itching and Rash  . Penicillins Rash    Did it involve swelling of the face/tongue/throat, SOB, or low BP? No Did it involve sudden or severe rash/hives, skin peeling, or any reaction on the inside of your mouth or nose? No Did you need to seek medical attention at a hospital or doctor's office? No When did it last happen?3-4 years ago If all above answers are "NO", may proceed with cephalosporin use.   . Sulfa Antibiotics Rash    Social History   Socioeconomic History  . Marital status: Married    Spouse name: Not on file  . Number of children: Not on file  .  Years of education: Not on file  . Highest education level: Not on file  Occupational History  . Not on file  Social Needs  . Financial resource strain: Not on file  . Food insecurity    Worry: Not on file    Inability: Not on file  . Transportation needs    Medical: Not on file    Non-medical: Not on file  Tobacco Use  . Smoking status: Never Smoker  . Smokeless tobacco: Never Used  Substance and Sexual Activity  . Alcohol use: No  . Drug use: No  . Sexual activity: Not Currently    Birth control/protection: None  Lifestyle  . Physical activity    Days per week: Not on file    Minutes per session: Not on file  . Stress: Not  on file  Relationships  . Social Herbalist on phone: Not on file    Gets together: Not on file    Attends religious service: Not on file    Active member of club or organization: Not on file    Attends meetings of clubs or organizations: Not on file    Relationship status: Not on file  . Intimate partner violence    Fear of current or ex partner: Not on file    Emotionally abused: Not on file    Physically abused: Not on file    Forced sexual activity: Not on file  Other Topics Concern  . Not on file  Social History Narrative   Lives at home, independent    Family History  Problem Relation Age of Onset  . Diabetes Mother   . Heart attack Father   . Diabetes Paternal Uncle   . Diabetes Maternal Grandmother   . Pancreatitis Maternal Grandmother   . Breast cancer Neg Hx   . Ovarian cancer Neg Hx   . Colon cancer Neg Hx     The following portions of the patient's history were reviewed and updated as appropriate: allergies, current medications, past family history, past medical history, past social history, past surgical history and problem list.  Review of Systems  ROS negative except as noted above. Information obtained from patient.   Objective:   BP 111/74   Pulse (!) 58   Ht 5' (1.524 m)   Wt 171 lb 12.8 oz (77.9 kg)   LMP 02/08/2019 (Exact Date)   Breastfeeding Unknown   BMI 33.55 kg/m    CONSTITUTIONAL: Well-developed, well-nourished female in no acute distress.   PHYSICAL EXAM: Not indicated.   GAD 7 : Generalized Anxiety Score 04/12/2019 06/08/2018  Nervous, Anxious, on Edge 0 3  Control/stop worrying 0 2  Worry too much - different things 0 2  Trouble relaxing 0 1  Restless 0 2  Easily annoyed or irritable 0 3  Afraid - awful might happen 0 1  Total GAD 7 Score 0 14  Anxiety Difficulty - Somewhat difficult    Depression screen [facility] 2/9 04/12/2019 06/08/2018 05/20/2018  Decreased Interest 0 0 0  Down, Depressed, Hopeless 0 0 0  PHQ - 2 Score  0 0 0  Altered sleeping 0 0 0  Tired, decreased energy 3 1 0  Change in appetite 0 0 0  Feeling bad or failure about yourself  0 0 0  Trouble concentrating 0 0 0  Moving slowly or fidgety/restless 0 0 0  Suicidal thoughts 0 0 0  PHQ-9 Score 3 1 0  Difficult doing work/chores Not difficult at  all - -     OBSTETRIC <14 WK [place] AND TRANSVAGINAL OB US [facility] ER, 04/06/19 2027)  TECHNIQUE: Both transabdominal and transvaginal ultrasound examinations were performed for complete evaluation of the gestation as well as the maternal uterus, adnexal regions, and pelvic cul-de-sac. Transvaginal technique was performed to assess early pregnancy.  COMPARISON:  Ultrasound dated 03/31/2019  FINDINGS: The uterus is anteverted.  No intrauterine pregnancy identified. There is a large complex predominantly echogenic and solid collection in the endometrium and lower uterine segment measuring approximately 5.0 x 2.0 x 1.6 cm. No significant internal flow identified within this collection. Some flow may be present in the superior aspect of this collection. This is most consistent with blood clot.  The ovaries are unremarkable. The right ovary measures 2.8 x 1.6 x 2.3 cm and the left ovary measures 3.4 x 2.1 x 2.1 cm.  IMPRESSION: No intrauterine pregnancy identified. Large blood clot within the endometrium most consistent with spontaneous abortion. Please note, in the absence of documented IUP by imaging, the possibility of an ectopic pregnancy cannot be excluded. Clinical correlation and follow-up with serial HCG levels and repeat ultrasound in 7 days, or earlier if clinically indicated, recommended.   Assessment:   1. Spontaneous abortion - Beta HCG, Quant  Plan:   Labs: beta today, see orders. Requisition given to patient for collect at work.   Follow up ultrasound x 1 week, see orders.   Miscarriage treatment options reviewed, see AVS.   Reviewed red flag symptoms and when  to call.   RTC x 1 week or sooner if needed.    Diona Fanti, CNM [facility], [facility] 04/12/19 5:43 PM     Diona Fanti, CNM [facility], [facility] 04/12/19 11:09 AM

## 2019-04-12 NOTE — Progress Notes (Signed)
Patient here to follow-up after SAB, not currently bleeding.

## 2019-04-12 NOTE — Patient Instructions (Signed)
FACTS YOU SHOULD KNOW  About Early Pregnancy Loss  WHAT IS AN EARLY PREGNANCY LOSS? Once the egg is fertilized with the sperm and begins to develop, it attaches to the lining of the uterus. This early pregnancy tissue may not develop into an embryo (the beginning stage of a baby). Sometimes an embryo does develop but does not continue to grow. These problems can be seen on ultrasound.   MANAGEMNT OF EARLY PREGNANCY LOSS: About 4 out of 100 (0.25%) women will have a pregnancy loss in her lifetime.  One in five pregnancies is found to be an early pregnancy loss.  There are 3 ways to care for an early pregnancy loss:   (1) Surgery, (2) Medicine, (3) Waiting for you to pass the pregnancy on your own. The decision as to how to proceed after being diagnosed with and early pregnancy loss is an individual one.  The decision can be made only after appropriate counseling.  You need to weigh the pros and cons of the 3 choices. Then you can make the choice that works for you.  SURGERY (D&E) . Procedure over in 1 day . Requires being put to sleep . Bleeding may be light . Possible problems during surgery, including injury to womb(uterus) . Care provider has more control Medicine ([place]) . The complete procedure may take days to weeks . No Surgery . Bleeding may be heavy at times . There may be drug side effects . Patient has more control Waiting . You may choose to wait, in which case your own body may complete the passing of the abnormal early pregnancy on its own in about 2-4 weeks . Your bleeding may be heavy at times . There is a small possibility that you may need surgery if the bleeding is too much or not all of the pregnancy has passed.  CYTOTEC MANAGEMENT Prostaglandins (cytotec) are the most widely used drug for this purpose. They cause the uterus to cramp and contract. You will place the medicine yourself inside your vagina in the privacy of your home. Empting of the uterus should occur  within 3 days but the process may continue for several weeks. The bleeding may seem heavy at times.  INSTRUCTIONS: Take all 4 tablets of cytotec (88mg total) at one time. This will cause a lot of cramping, you may have bleeding, and pass tissue, then the cramping and bleeding should get better. If you do not pass the tissue, then you can take 4 more tablets of cytotec (8092m total) 48 hours after your first dose.  You will come back to have your blood drawn to make sure the pregnancy hormones are dropping in 1 week. Please call [facility] you have any questions.   POSSIBLE SIDE EFFECTS FROM CYTOTEC . Nausea  Vomiting . Diarrhea Fever . Chills  Hot Flashes Side effects  from the process of the early pregnancy loss include: . Cramping  Bleeding . Headaches  Dizziness RISKS: This is a low risk procedure. Less than 1 in 100 women has a complication. An incomplete passage of the early pregnancy may occur. Also, hemorrhage (heavy bleeding) could happen.  Rarely the pregnancy will not be passed completely. Excessively heavy bleeding may occur.  Your doctor may need to perform surgery to empty the uterus (D&E). Afterwards: Everybody will feel differently after the early pregnancy loss completion. You may have soreness or cramps for a day or two. You may have soreness or cramps for day or two.  You may have light  bleeding for up to 2 weeks. You may be as active as you feel like being. If you have any of the following problems you may call [facility] at [phone]. . If you have pain that does not get better with pain medication . Bleeding that soaks through 2 thick full-sized sanitary pads in an hour . Cramps that last longer than 2 days . Foul smelling discharge . Fever above 100.4 degrees F Even if you do not have any of these symptoms, you should have a follow-up exam to make sure you are healing properly. Your next normal period will usually start again in 4-6 week after the loss. You can  get pregnant soon after the loss, so use birth control right away. Finally: Make sure all your questions are answered before during and after any procedure. Follow up with medical care and family planning methods.   Miscarriage A miscarriage is the loss of an unborn baby (fetus) before the 20th week of pregnancy. Follow these instructions at home: Medicines   Take over-the-counter and prescription medicines only as told by your doctor.  If you were prescribed antibiotic medicine, take it as told by your doctor. Do not stop taking the antibiotic even if you start to feel better.  Do not take NSAIDs unless your doctor says that this is safe for you. NSAIDs include aspirin and ibuprofen. These medicines can cause bleeding. Activity  Rest as directed. Ask your doctor what activities are safe for you.  Have someone help you at home during this time. General instructions  Write down how many pads you use each day and how soaked they are.  Watch the amount of tissue or clumps of blood (blood clots) that you pass from your vagina. Save any large amounts of tissue for your doctor.  Do not use tampons, douche, or have sex until your doctor approves.  To help you and your partner with the process of grieving, talk with your doctor or seek counseling.  When you are ready, meet with your doctor to talk about steps you should take for your health. Also, talk with your doctor about steps to take to have a healthy pregnancy in the future.  Keep all follow-up visits as told by your doctor. This is important. Contact a doctor if:  You have a fever or chills.  You have vaginal discharge that smells bad.  You have more bleeding. Get help right away if:  You have very bad cramps or pain in your back or belly.  You pass clumps of blood that are walnut-sized or larger from your vagina.  You pass tissue that is walnut-sized or larger from your vagina.  You soak more than 1 regular pad in an  hour.  You get light-headed or weak.  You faint (pass out).  You have feelings of sadness that do not go away, or you have thoughts of hurting yourself. Summary  A miscarriage is the loss of an unborn baby before the 20th week of pregnancy.  Follow your doctor's instructions for home care. Keep all follow-up appointments.  To help you and your partner with the process of grieving, talk with your doctor or seek counseling. This information is not intended to replace advice given to you by your health care provider. Make sure you discuss any questions you have with your health care provider. Document Released: 11/25/2011 Document Revised: 12/25/2018 Document Reviewed: 10/08/2016 Elsevier Patient Education  2020 [employer].

## 2019-04-13 ENCOUNTER — Encounter: Payer: Self-pay | Admitting: Certified Nurse Midwife

## 2019-04-13 LAB — BETA HCG QUANT (REF LAB): hCG Quant: 24 m[IU]/mL

## 2019-04-14 ENCOUNTER — Encounter: Payer: Self-pay | Admitting: Obstetrics and Gynecology

## 2019-04-15 ENCOUNTER — Ambulatory Visit
Admission: EM | Admit: 2019-04-15 | Discharge: 2019-04-15 | Disposition: A | Discharge status: Home / Self Care | Payer: Managed Care, Other (non HMO) | Attending: Family Medicine | Admitting: Family Medicine

## 2019-04-15 ENCOUNTER — Encounter: Payer: Self-pay | Admitting: Emergency Medicine

## 2019-04-15 ENCOUNTER — Other Ambulatory Visit: Payer: Self-pay

## 2019-04-15 DIAGNOSIS — J029 Acute pharyngitis, unspecified: Secondary | ICD-10-CM

## 2019-04-15 DIAGNOSIS — Z20822 Contact with and (suspected) exposure to covid-19: Secondary | ICD-10-CM

## 2019-04-15 DIAGNOSIS — J301 Allergic rhinitis due to pollen: Secondary | ICD-10-CM | POA: Diagnosis not present

## 2019-04-15 DIAGNOSIS — Z20828 Contact with and (suspected) exposure to other viral communicable diseases: Secondary | ICD-10-CM | POA: Diagnosis not present

## 2019-04-15 LAB — RAPID STREP SCREEN (MED CTR MEBANE ONLY): Streptococcus, Group A Screen (Direct): NEGATIVE

## 2019-04-15 NOTE — Discharge Instructions (Signed)
It was very nice seeing you today in clinic. Thank you for entrusting me with your care.   Allergies seen to be causing your symptoms. Make sure you are taking your Zyrtec daily. May add Flonase if needed. Increase fluid intake and rest.   You were swabbed for COVID. It has been taking about 3-5 days to come back. Stay home and self quarantine, per [facility] DHHS guidelines, until negative results received.   Make arrangements to follow up with your regular doctor in 1 week for re-evaluation if not improving. If your symptoms/condition worsens, please seek follow up care either here or in the ER. Please remember, our [place] providers are "right here with you" when you need [place].   Again, it was my pleasure to take care of you today. Thank you for choosing our clinic. I hope that you start to feel better quickly.   Honor Loh, MSN, APRN, FNP-C, CEN Advanced Practice Provider [facility]

## 2019-04-15 NOTE — ED Triage Notes (Signed)
Pt c/o "low grade fever", headache, sore throat, chest heaviness. Started about 4 days but the sore throat started this morning. She states that she took tylenol about an hour ago.

## 2019-04-16 NOTE — ED Provider Notes (Signed)
[place], [place]   Name: Alexandria Orozco DOB: 08-Oct-1985 MRN: [id number] CSN: [id number] PCP: Patient, No Pcp Per  Arrival date and time:  04/15/19 1745  Chief Complaint:  Sore Throat   NOTE: Prior to seeing the patient today, I have reviewed the triage nursing documentation and vital signs. Clinical staff has updated patient's PMH/PSHx, current medication list, and drug allergies/intolerances to ensure comprehensive history available to assist in medical decision making.   History:   HPI: Alexandria Orozco is a 33 y.o. female who presents today with complaints of recent direct exposure to SARS-CoV-2 (novel coronavirus). Patient notes daily close contact with an individual who tested positive for the virus while at work. Patient presents today with complaints progressive concerning symptom constellation that began on Sunday (04/11/2019) with a simple non-specific headache. Monday morning, patient woke up with a chills and low grade temperatures (Tmax 99.9). She states, "I just haven't felt good for about a week". Patient developed a minor sore throat this morning. She denies any shortness of breath, cough, nausea, vomiting, or changes to her ability to taste or smell. PMH (+) for seasonal allergies, however she does not take any type of allergy medications. She also has a history of anxiety, which she notes increased once she learned that she had been in unmasked contact with someone who has tested positive for SARS-CoV-2.  Past Surgical History:  Procedure Laterality Date  . CERVICAL CONIZATION W/BX N/A 11/23/2018   Procedure: COLD KNIFE CONIZATION CERVIX WITH BIOPSY;  Surgeon: Rubie Maid, MD;  Location: [facility];  Service: Gynecology;  Laterality: N/A;  . CHOLECYSTECTOMY    . ESOPHAGOGASTRODUODENOSCOPY (EGD) WITH PROPOFOL N/A 08/06/2017   Procedure: ESOPHAGOGASTRODUODENOSCOPY (EGD) WITH PROPOFOL;  Surgeon: Lucilla Lame, MD;  Location: [facility];  Service: Endoscopy;  Laterality:  N/A;  . TONSILLECTOMY      Family History  Problem Relation Age of Onset  . Diabetes Mother   . Heart attack Father   . Diabetes Paternal Uncle   . Diabetes Maternal Grandmother   . Pancreatitis Maternal Grandmother   . Breast cancer Neg Hx   . Ovarian cancer Neg Hx   . Colon cancer Neg Hx     Social History   Tobacco Use  . Smoking status: Never Smoker  . Smokeless tobacco: Never Used  Substance Use Topics  . Alcohol use: No  . Drug use: No    Patient Active Problem List   Diagnosis Date Noted  . 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 S/P conization of cervix 11/23/2018  . Abdominal pain, epigastric   . Gastritis without bleeding   . Abdominal pain 07/20/2017  . Syncope and collapse 04/10/2016    Home Medications:    No outpatient medications have been marked as taking for the 04/15/19 encounter [facility] Encounter).    Allergies:   Ampicillin, Morphine and related, Penicillins, and Sulfa antibiotics  Review of Systems (ROS): Review of Systems  Constitutional: Positive for chills, fatigue and fever (Tmax 99.9).  HENT: Positive for rhinorrhea, sneezing and sore throat. Negative for congestion, ear pain, postnasal drip, sinus pressure and sinus pain.   Eyes: Negative for pain, discharge and redness.  Respiratory: Negative for cough, chest tightness and shortness of breath.   Cardiovascular: Negative for chest pain and palpitations.  Gastrointestinal: Negative for abdominal pain, diarrhea, nausea and vomiting.  Musculoskeletal: Negative for arthralgias, back pain, myalgias and neck pain.  Skin: Negative for color change, pallor and rash.  Neurological: Positive for headaches.  Negative for dizziness, syncope and weakness.  Hematological: Negative for adenopathy.     Vital Signs: Today's Vitals   04/15/19 1818 04/15/19 1822 04/15/19 1858  BP:  112/76   Pulse:  67   Resp:  18   Temp:  98.5 F (36.9 C)   TempSrc:  Oral   SpO2:  100%   Weight: 171 lb  (77.6 kg)    Height: 5' (1.524 m)    PainSc: 4   4     Physical Exam: Physical Exam  Constitutional: She is oriented to person, place, and time and well-developed, well-nourished, and in no distress. No distress.  HENT:  Head: Normocephalic and atraumatic.  Nose: Mucosal edema and rhinorrhea present. No sinus tenderness.  Mouth/Throat: Uvula is midline. Posterior oropharyngeal erythema present. No oropharyngeal exudate or posterior oropharyngeal edema.  Eyes: Pupils are equal, round, and reactive to light. Conjunctivae and EOM are normal.  Neck: Normal range of motion. Neck supple. No tracheal deviation present.  Cardiovascular: Normal rate, regular rhythm, normal heart sounds and intact distal pulses. Exam reveals no gallop and no friction rub.  No murmur heard. Pulmonary/Chest: Effort normal and breath sounds normal. No respiratory distress. She has no wheezes. She has no rales.  Abdominal: Soft. Bowel sounds are normal. She exhibits no distension. There is no abdominal tenderness.  Musculoskeletal: Normal range of motion.  Lymphadenopathy:    She has no cervical adenopathy.  Neurological: She is alert and oriented to person, place, and time. Gait normal. GCS score is 15.  Skin: Skin is warm and dry. No rash noted. She is not diaphoretic.  Psychiatric: Mood, memory, affect and judgment normal.    Urgent Care Treatments / Results:   LABS: PLEASE NOTE: all labs that were ordered this encounter are listed, however only abnormal results are displayed. Labs Reviewed  RAPID STREP SCREEN ([facility] ONLY)  NOVEL CORONAVIRUS, NAA (HOSPITAL ORDER, SEND-OUT TO REF LAB)  CULTURE, GROUP A STREP [facility])    EKG: -None  RADIOLOGY: No results found.  PROCEDURES: Procedures  MEDICATIONS RECEIVED THIS VISIT: Medications - No data to display  PERTINENT CLINICAL COURSE NOTES/UPDATES:   Initial Impression / Assessment and Plan / Urgent Care Course:  Pertinent labs & imaging results  that were available during my care of the patient were personally reviewed by me and considered in my medical decision making (see lab/imaging section of note for values and interpretations).  Alexandria Orozco is a 33 y.o. female who presents to [facility] today with complaints of sore throat, low grade "fevers", headaches, and general malaise.  Patient is well appearing overall in clinic today. She does not appear to be in any acute distress. Presenting symptoms (see HPI) and exam as documented above. Strep swab negative; reflex culture sent. Posterior pharynx minimally erythematous with (+) clear PND. She has rhinorrhea. Suspect symptoms related to her known untreated allergic rhinitis. Recommended warm salt water gargles and hard candy/lozenges to help soothe throat. Advised to start daily cetirizine and fluticasone to help with her allergy symptoms. Encouraged to ensure adequate daily hydration, especially during acute phases of her not feeling well.   She presents following a direct exposure to SARS-CoV-2 (novel coronavirus). Discussed typical symptom constellation. Reviewed potential for infection with recent close contact. Given exposure and potential for infection, testing is reasonable. Patient collected SARS-CoV-2 via facility approved self swab process today under the supervision of certified clinical staff. Discussed variable turn around times associated with testing, as swabs are being  processed at [facility], and have been taking as long as 7 days. She was advised to self quarantine, per [facility] DHHS guidelines, until negative results received.   Current clinical condition warrants patient being out of work in order to recover from her current injury/illness. She was provided with the appropriate documentation to provide to her place of employment that will allow for her to RTW on 04/17/2019 with no restrictions pending negative SARS-CoV-2 (novel coronavirus) test results.   Discussed follow  up with primary care physician in 1 week for re-evaluation. I have reviewed the follow up and strict return precautions for any new or worsening symptoms. Patient is aware of symptoms that would be deemed urgent/emergent, and would thus require further evaluation either here or in the emergency department. At the time of discharge, she verbalized understanding and consent with the discharge plan as it was reviewed with her. All questions were fielded by provider and/or clinic staff prior to patient discharge.    Final Clinical Impressions / Urgent Care Diagnoses:   Final diagnoses:  Seasonal allergic rhinitis due to pollen  Close Exposure to Covid-19 Virus    New Prescriptions:   Controlled Substance Registry consulted? Not Applicable  No orders of the defined types were placed in this encounter.   Recommended Follow up Care:  Patient encouraged to follow up with the following provider within the specified time frame, or sooner as dictated by the severity of her symptoms. As always, she was instructed that for any urgent/emergent care needs, she should seek care either here or in the emergency department for more immediate evaluation.  Follow-up Information    PCP In 1 week.   Why: General reassessment of symptoms if not improving        NOTE: This note was prepared using Lobbyist along with smaller Company secretary. Despite my best ability to proofread, there is the potential that transcriptional errors may still occur from this process, and are completely unintentional.     Karen Kitchens, NP 04/16/19 2149

## 2019-04-17 LAB — NOVEL CORONAVIRUS, NAA (HOSP ORDER, SEND-OUT TO REF LAB; TAT 18-24 HRS): SARS-CoV-2, NAA: NOT DETECTED

## 2019-04-18 LAB — CULTURE, GROUP A STREP (THRC)

## 2019-04-19 ENCOUNTER — Encounter (HOSPITAL_COMMUNITY): Payer: Self-pay

## 2019-04-20 ENCOUNTER — Other Ambulatory Visit: Payer: Managed Care, Other (non HMO)

## 2019-04-20 ENCOUNTER — Encounter: Payer: Managed Care, Other (non HMO) | Admitting: Certified Nurse Midwife

## 2019-05-05 ENCOUNTER — Encounter: Payer: Self-pay | Admitting: Obstetrics and Gynecology

## 2019-08-01 IMAGING — US US ABDOMEN LIMITED
1 series · 14 of 25 positions shown · non-contrast
Comparison: 11/06/2014 and abdomen and pelvis CT dated 10/29/2008.

CLINICAL DATA: Epigastric abdominal pain for the past 2 days.
History of a dilated common bile duct following cholecystectomy.

EXAM:
ULTRASOUND ABDOMEN LIMITED RIGHT UPPER QUADRANT

[Series 1: us abdomen limited · 0.26mm/px · 14 of 35 slices shown]
[im 1/35]
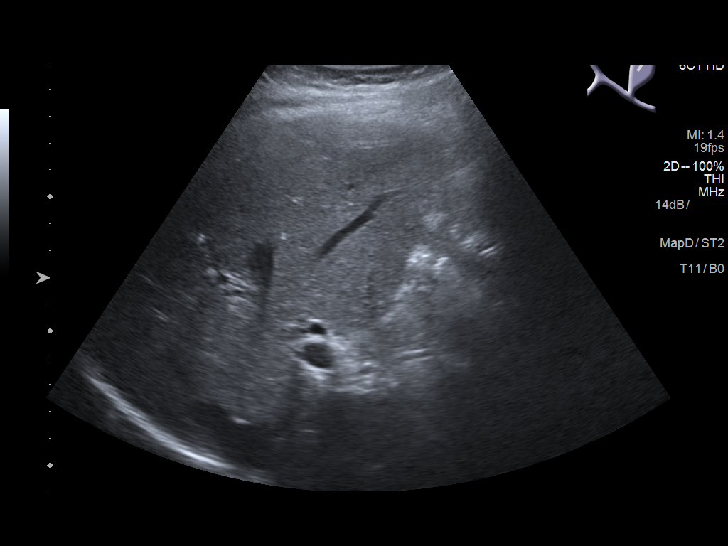
[im 3/35]
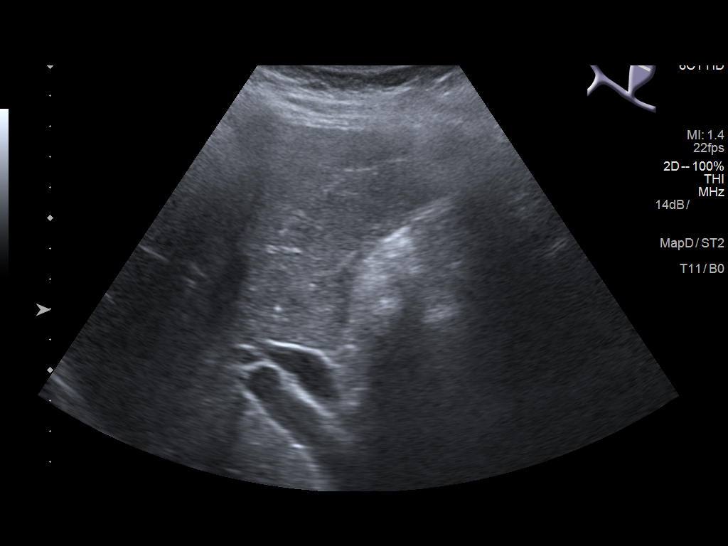
[im 6/35]
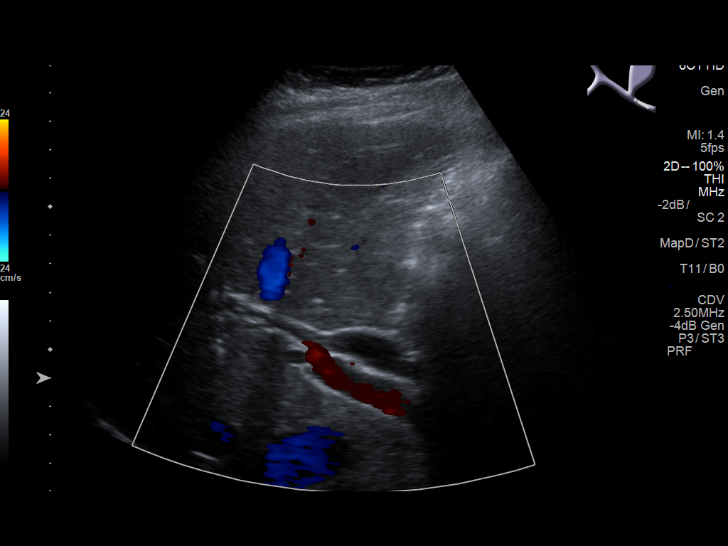
[im 9/35]
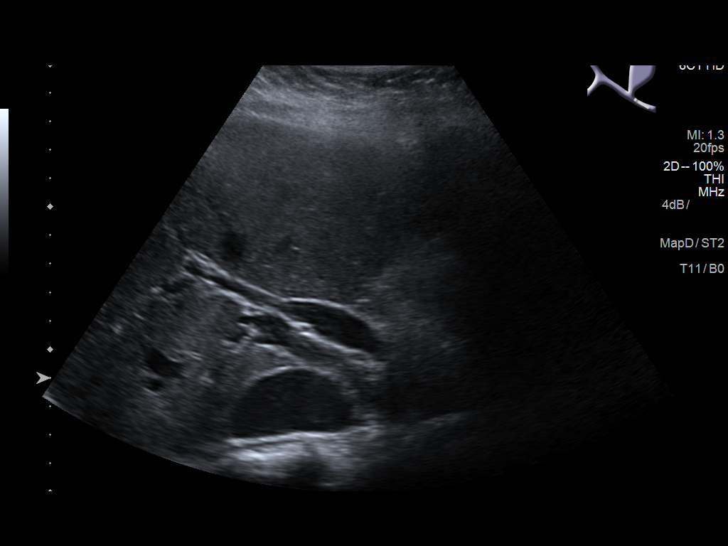
[im 12/35]
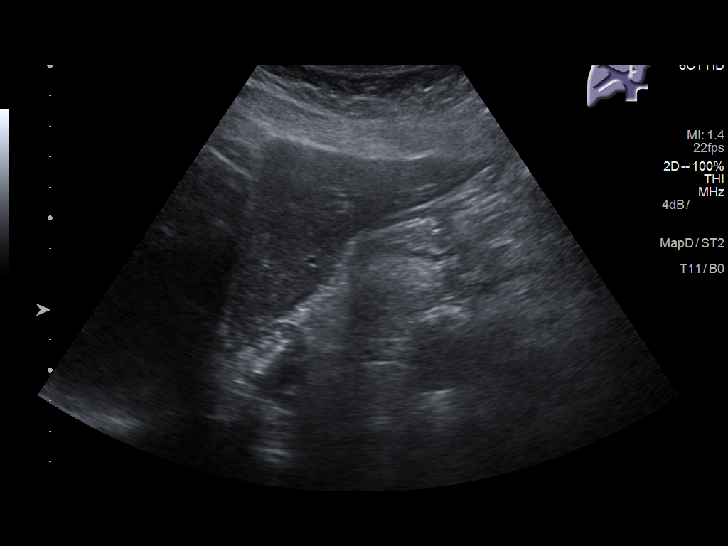
[im 13/35]
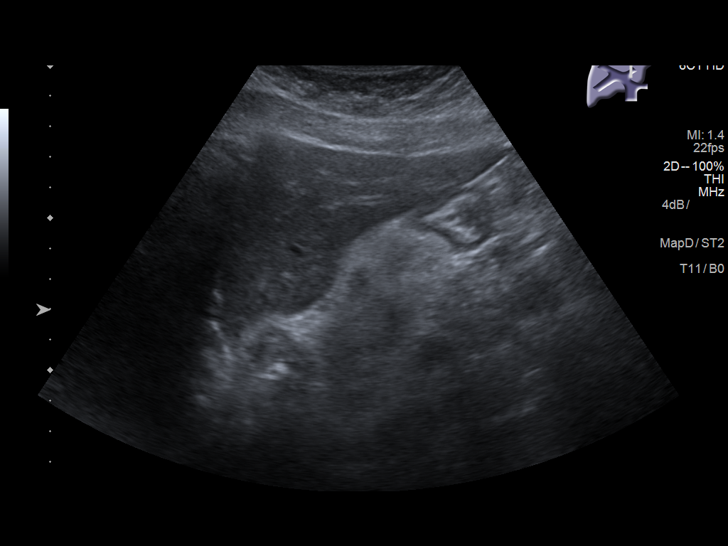
[im 16/35]
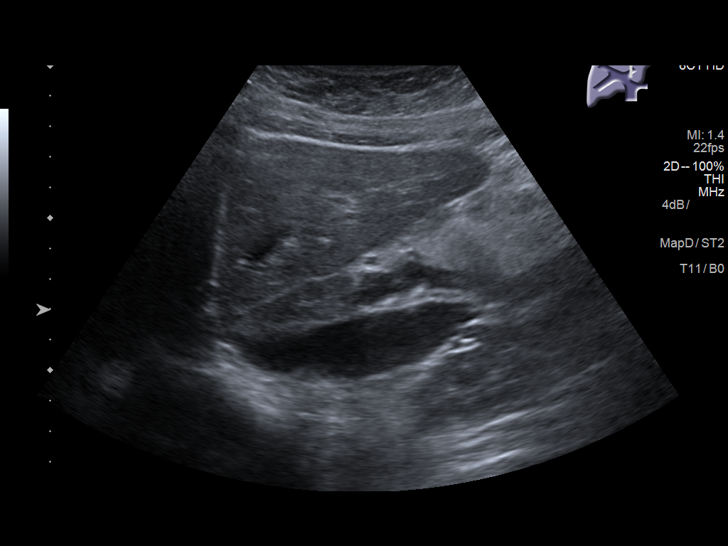
[im 19/35]
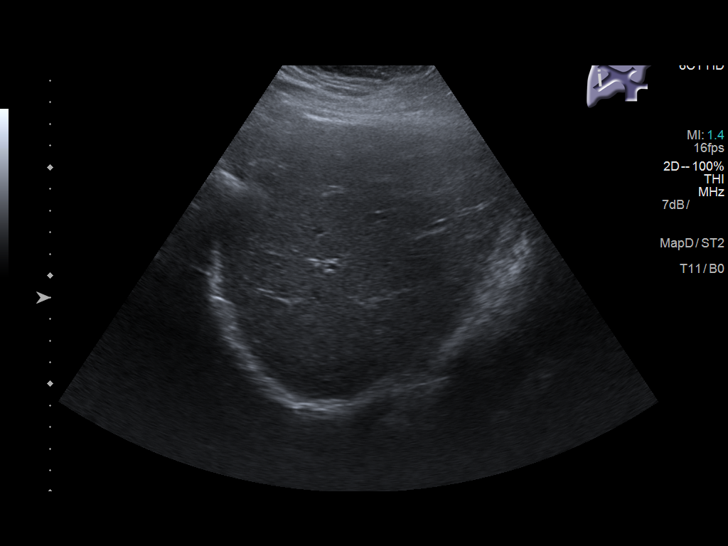
[im 22/35]
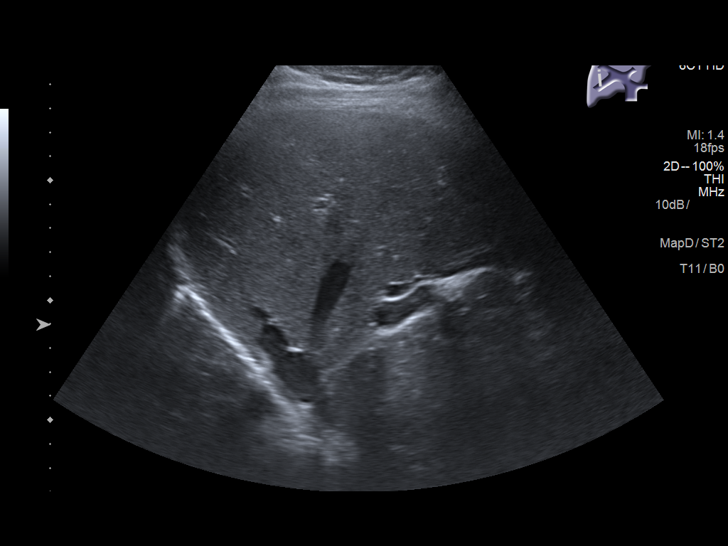
[im 23/35]
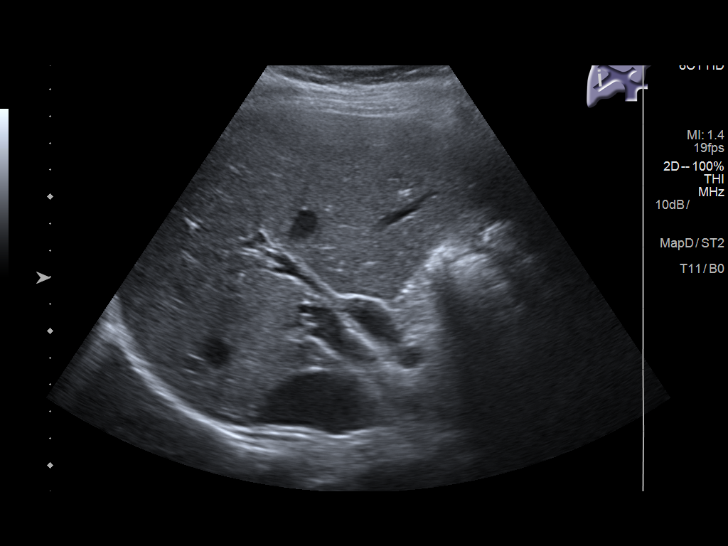
[im 26/35]
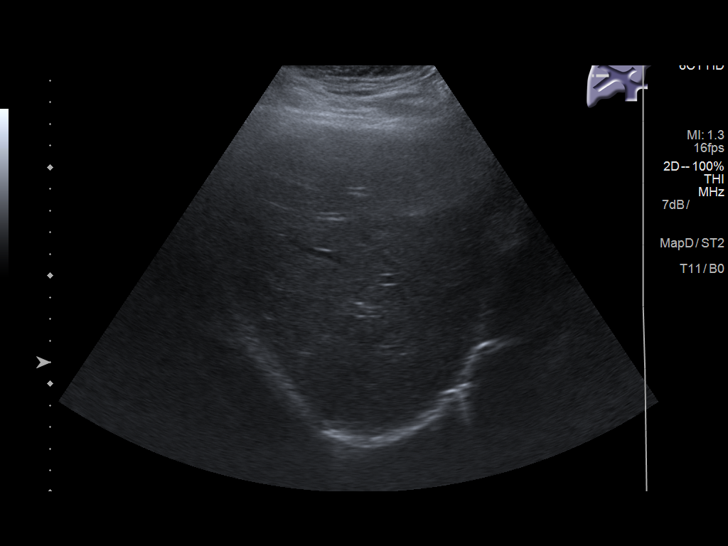
[im 29/35]
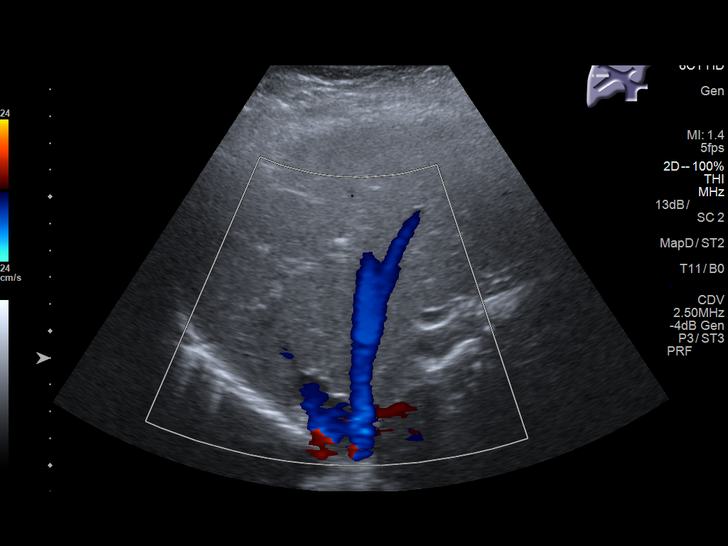
[im 32/35]
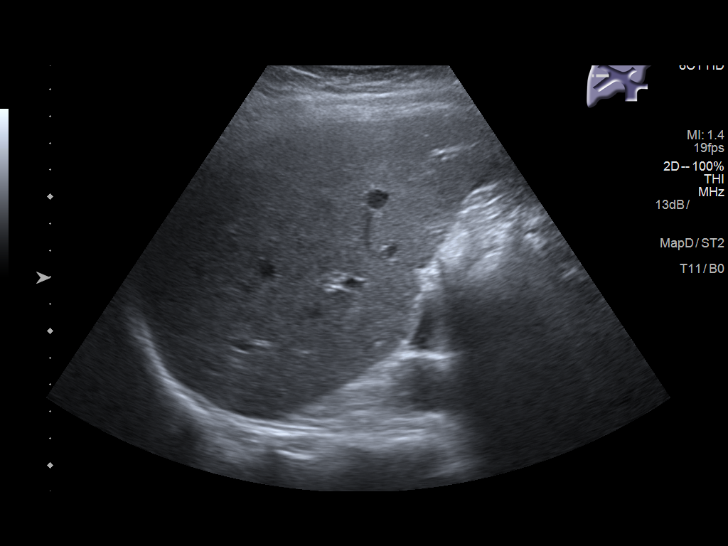
[im 35/35]
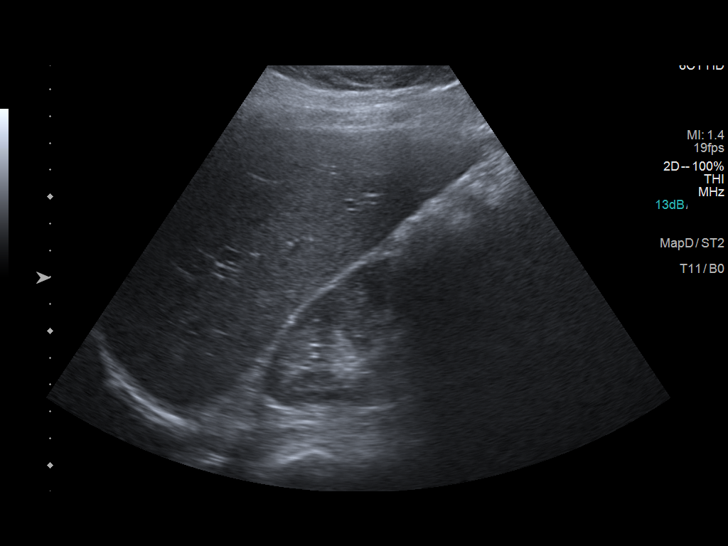

[14 of 25 positions shown; findings below may reference images not displayed]

FINDINGS: Gallbladder:

Surgically absent.

Common bile duct:

Diameter: 8.8 mm proximally, larger in the midportion of the duct,
unchanged.

Liver:

No focal lesion identified. Within normal limits in parenchymal
echogenicity. No intrahepatic biliary ductal dilatation. Portal vein
is patent on color Doppler imaging with normal direction of blood
flow towards the liver.
IMPRESSION: 1. Stable post cholecystectomy common duct dilatation without
intrahepatic dilatation.
2. No acute abnormality.

## 2019-08-17 ENCOUNTER — Encounter: Payer: Self-pay | Admitting: Certified Nurse Midwife

## 2019-09-24 ENCOUNTER — Encounter: Payer: Self-pay | Admitting: Certified Nurse Midwife

## 2019-11-24 ENCOUNTER — Other Ambulatory Visit: Payer: Self-pay | Admitting: Obstetrics and Gynecology

## 2020-07-21 IMAGING — US OBSTETRIC <14 WK ULTRASOUND
1 series · 13 of 28 positions shown · non-contrast
Comparison: Ultrasound dated 03/31/2019

CLINICAL DATA: [age] year female with positive HCG level
presenting with vaginal bleeding. LMP: 02/08/2019 corresponding to
an estimated gestational age of 8 weeks, 1 day.

EXAM:
OBSTETRIC <14 WK US AND TRANSVAGINAL OB US
TECHNIQUE: Both transabdominal and transvaginal ultrasound examinations were
performed for complete evaluation of the gestation as well as the
maternal uterus, adnexal regions, and pelvic cul-de-sac.
Transvaginal technique was performed to assess early pregnancy.

[Series 1: obstetric <14 wk ultrasound · 13 of 120 slices shown]
[im 5/120]
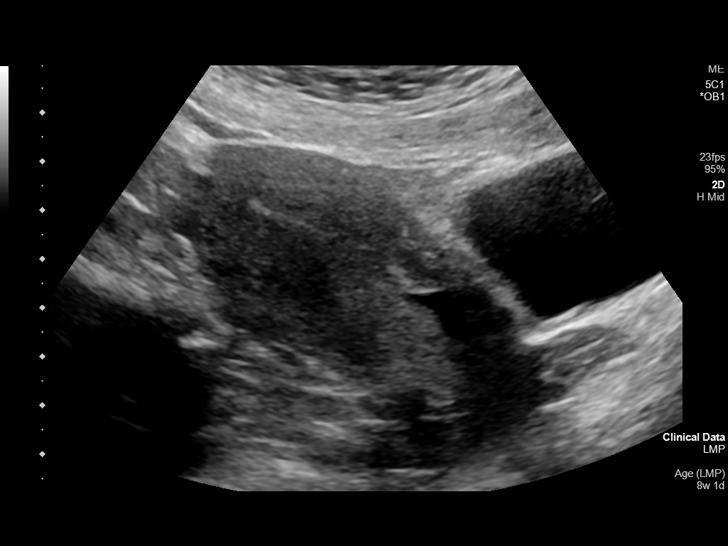
[im 14/120]
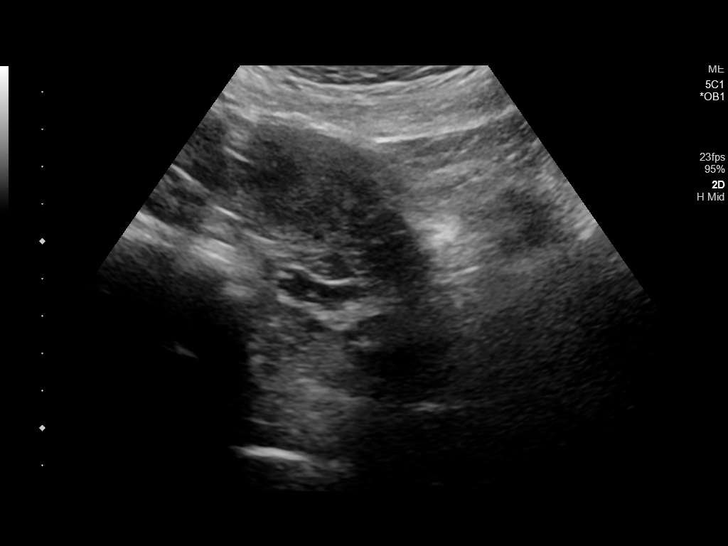
[im 23/120]
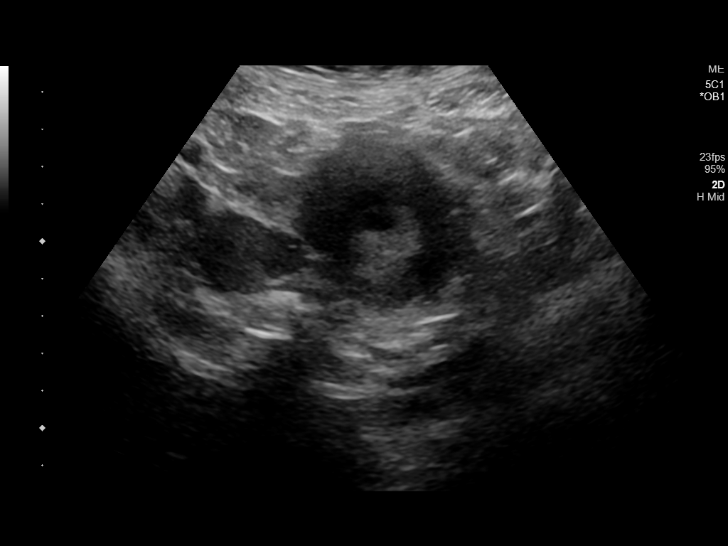
[im 31/120]
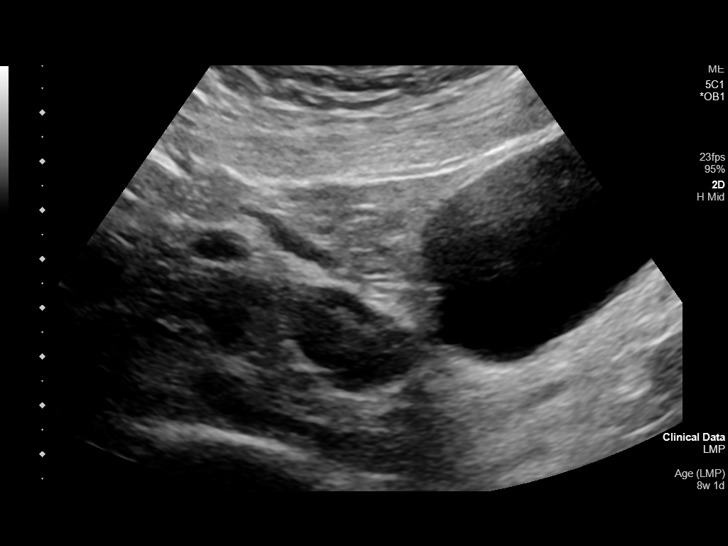
[im 40/120]
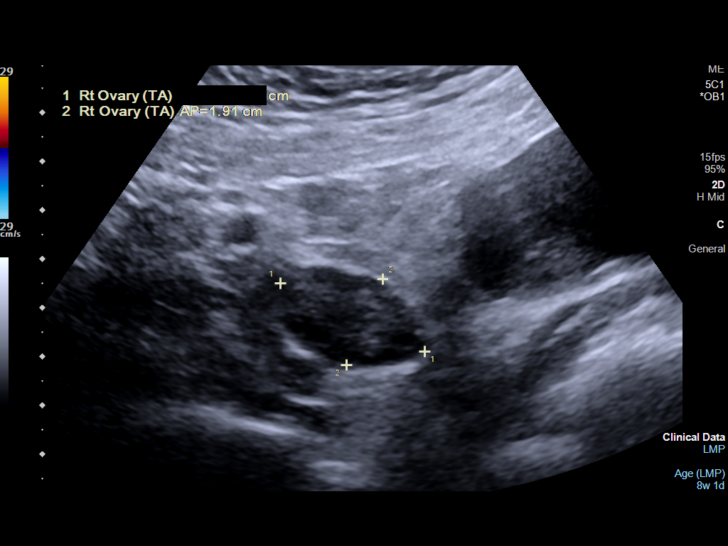
[im 49/120]
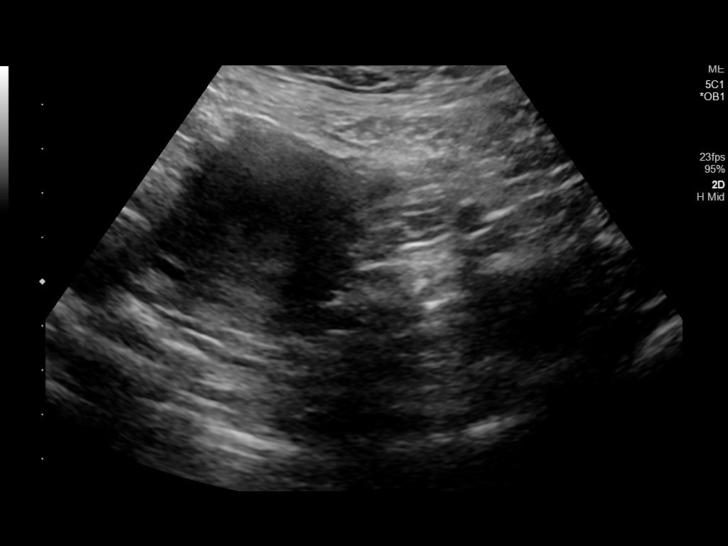
[im 62/120]
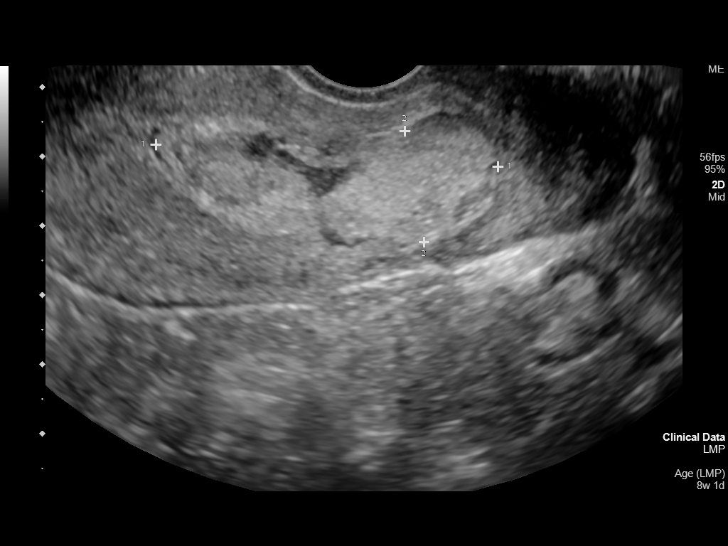
[im 71/120]
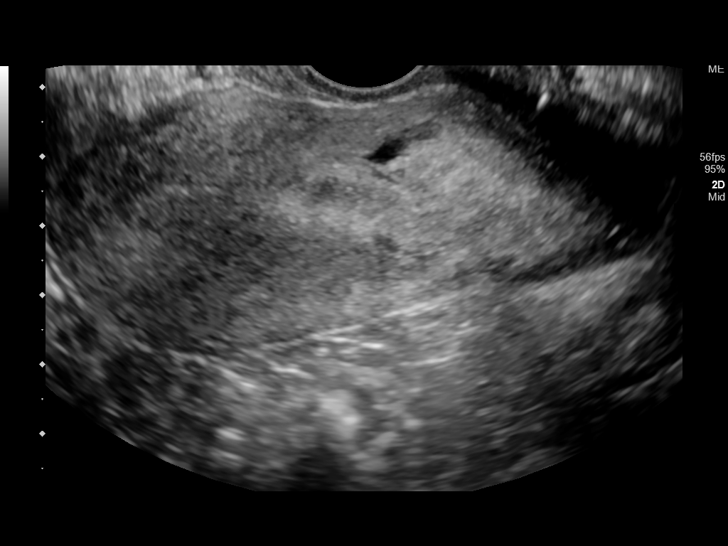
[im 80/120]
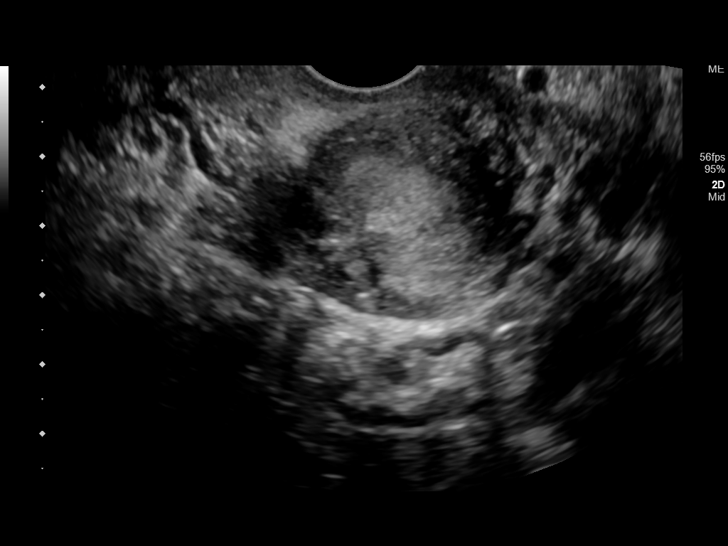
[im 89/120]
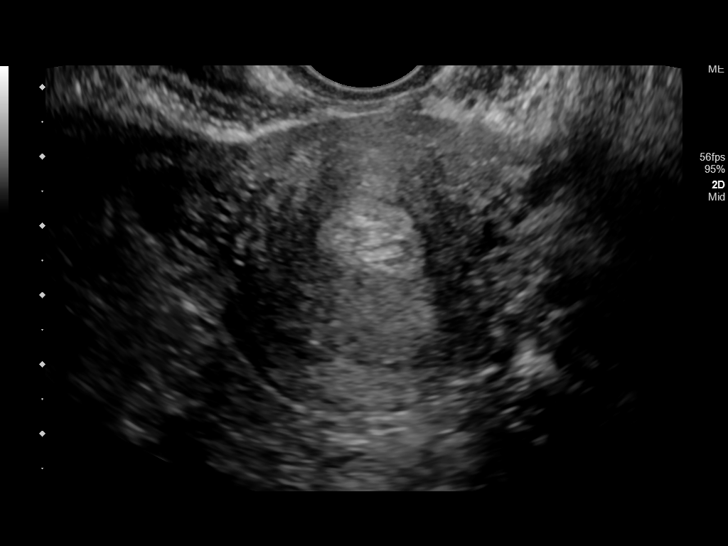
[im 97/120]
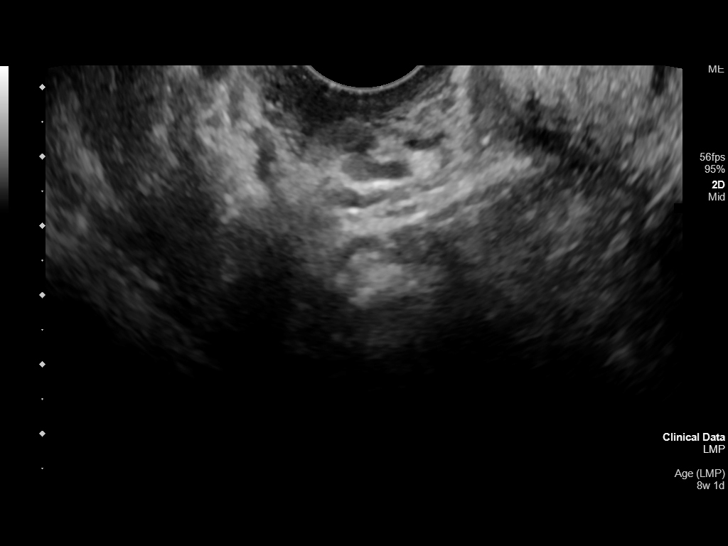
[im 106/120]
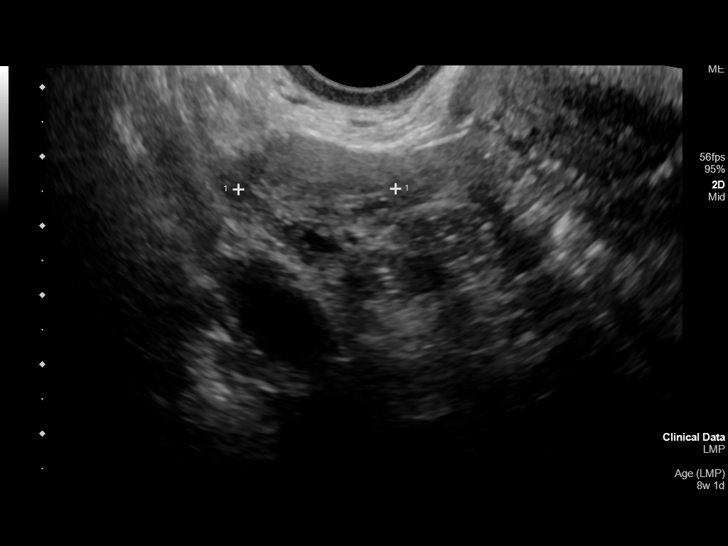
[im 115/120]
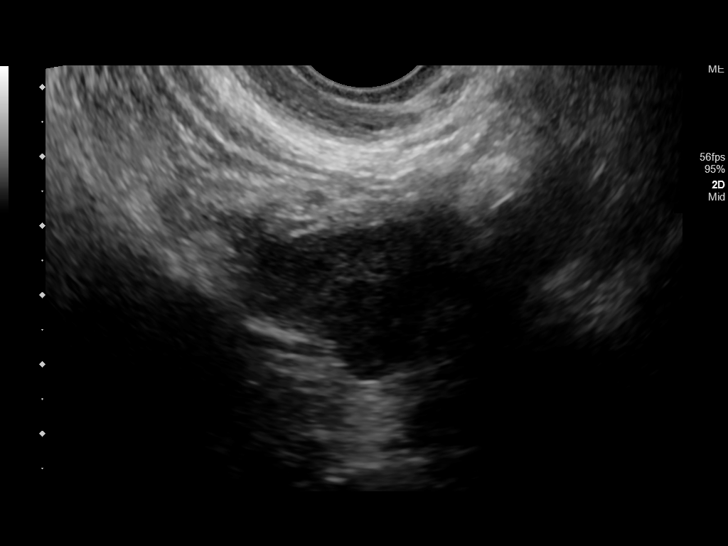

[13 of 28 positions shown; findings below may reference images not displayed]

FINDINGS: The uterus is anteverted.

No intrauterine pregnancy identified. There is a large complex
predominantly echogenic and solid collection in the endometrium and
lower uterine segment measuring approximately 5.0 x 2.0 x 1.6 cm. No
significant internal flow identified within this collection. Some
flow may be present in the superior aspect of this collection. This
is most consistent with blood clot.

The ovaries are unremarkable. The right ovary measures 2.8 x 1.6 x
2.3 cm and the left ovary measures 3.4 x 2.1 x 2.1 cm.
IMPRESSION: No intrauterine pregnancy identified. Large blood clot within the
endometrium most consistent with spontaneous abortion. Please note,
in the absence of documented IUP by imaging, the possibility of an
ectopic pregnancy cannot be excluded. Clinical correlation and
follow-up with serial HCG levels and repeat ultrasound in 7 days, or
earlier if clinically indicated, recommended.

## 2020-08-24 ENCOUNTER — Other Ambulatory Visit (INDEPENDENT_AMBULATORY_CARE_PROVIDER_SITE_OTHER): Payer: Managed Care, Other (non HMO)

## 2020-08-24 ENCOUNTER — Other Ambulatory Visit: Payer: Self-pay

## 2020-08-24 ENCOUNTER — Ambulatory Visit (INDEPENDENT_AMBULATORY_CARE_PROVIDER_SITE_OTHER): Payer: Managed Care, Other (non HMO) | Admitting: Obstetrics and Gynecology

## 2020-08-24 ENCOUNTER — Other Ambulatory Visit: Payer: Self-pay | Admitting: Obstetrics and Gynecology

## 2020-08-24 ENCOUNTER — Encounter: Payer: Self-pay | Admitting: Obstetrics and Gynecology

## 2020-08-24 VITALS — BP 100/60 | Wt 151.0 lb

## 2020-08-24 DIAGNOSIS — Z7185 Encounter for immunization safety counseling: Secondary | ICD-10-CM

## 2020-08-24 DIAGNOSIS — O09891 Supervision of other high risk pregnancies, first trimester: Secondary | ICD-10-CM

## 2020-08-24 DIAGNOSIS — Z113 Encounter for screening for infections with a predominantly sexual mode of transmission: Secondary | ICD-10-CM

## 2020-08-24 DIAGNOSIS — O219 Vomiting of pregnancy, unspecified: Secondary | ICD-10-CM

## 2020-08-24 DIAGNOSIS — Z369 Encounter for antenatal screening, unspecified: Secondary | ICD-10-CM

## 2020-08-24 DIAGNOSIS — Z3480 Encounter for supervision of other normal pregnancy, unspecified trimester: Secondary | ICD-10-CM | POA: Insufficient documentation

## 2020-08-24 DIAGNOSIS — Z3A01 Less than 8 weeks gestation of pregnancy: Secondary | ICD-10-CM

## 2020-08-24 DIAGNOSIS — Z124 Encounter for screening for malignant neoplasm of cervix: Secondary | ICD-10-CM

## 2020-08-24 DIAGNOSIS — Z9889 Other specified postprocedural states: Secondary | ICD-10-CM

## 2020-08-24 DIAGNOSIS — N926 Irregular menstruation, unspecified: Secondary | ICD-10-CM | POA: Diagnosis not present

## 2020-08-24 DIAGNOSIS — O0991 Supervision of high risk pregnancy, unspecified, first trimester: Secondary | ICD-10-CM

## 2020-08-24 DIAGNOSIS — O26891 Other specified pregnancy related conditions, first trimester: Secondary | ICD-10-CM

## 2020-08-24 DIAGNOSIS — R519 Headache, unspecified: Secondary | ICD-10-CM

## 2020-08-24 LAB — POCT URINALYSIS DIPSTICK OB
Glucose, UA: NEGATIVE
POC,PROTEIN,UA: NEGATIVE

## 2020-08-24 MED ORDER — BUTALBITAL-APAP-CAFFEINE 50-325-40 MG PO CAPS: 1.0000 | ORAL_CAPSULE | Freq: Four times a day (QID) | ORAL | 3 refills | Status: DC | PRN

## 2020-08-24 MED ORDER — DOXYLAMINE-PYRIDOXINE 10-10 MG PO TBEC: 2.0000 | DELAYED_RELEASE_TABLET | Freq: Every day | ORAL | 5 refills | Status: DC

## 2020-08-24 NOTE — Progress Notes (Signed)
New Obstetric Patient H&P    Chief Complaint: "Desires prenatal care"   History of Present Illness: Patient is a 34 y.o. [phone] Not Hispanic or Latino female, presents with amenorrhea and positive home pregnancy test. Patient's last menstrual period was 07/05/2020. and based on her  LMP, her EDD is Estimated Date of Delivery: 04/11/21 and her EGA is [redacted]w[redacted]d. Cycles are 4. days, regular, and occur approximately every : 35 days. 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 At the time, recommendation was made for paps every 4-5 months. Patient has not had pap since that time.   She had a urine pregnancy test which was positive 3 week(s)  ago. Her last menstrual period was normal and lasted for  4 day(s). Since her LMP she claims she has experienced an increase in headaches and nausea. She denies vaginal bleeding. Her past medical history is contibutory. Her prior pregnancies are notable for preterm labor. Patient experienced PTL with G1 and delivered at [redacted] weeks gestation. G2 was a term IOL at 39 weeks for advanced cervical dilation. G3 was PTL and delivered @ 35 wks.  Since her LMP, she admits to the use of tobacco products  no She claims she has gained   3 pounds since the start of her pregnancy.  There are cats in the home in the home  no  She admits close contact with children on a regular basis  yes  She has had chicken pox in the past yes She has had Tuberculosis exposures, symptoms, or previously tested positive for TB   no Current or past history of domestic violence. no  Genetic Screening/Teratology Counseling: (Includes patient, baby's father, or anyone in either family with:)   71. Patient's age >/= 51 at [employer]  no 2. Thalassemia ([place], [place], [place], or Asian background): MCV<80  no 3. Neural tube defect (meningomyelocele, spina bifida, anencephaly)  no 4. Congenital heart defect  no  5. Down syndrome  no 6. Tay-Sachs (Jewish,  [place])  no 7. Canavan's Disease  no 8. Sickle cell disease or trait (African)  no  9. Hemophilia or other blood disorders  no  10. Muscular dystrophy  no  11. Cystic fibrosis  no  12. Huntington's Chorea  no  13. Mental retardation/autism  no 14. Other inherited genetic or chromosomal disorder  no 15. Maternal metabolic disorder (DM, PKU, etc)  no 16. Patient or FOB with a child with a birth defect not listed above no  16a. Patient or FOB with a birth defect themselves no 17. Recurrent pregnancy loss, or stillbirth  no  18. Any medications since LMP other than prenatal vitamins (include vitamins, supplements, OTC meds, drugs, alcohol)  no 19. Any other genetic/environmental exposure to discuss  no  Infection History:   1. Lives with someone with TB or TB exposed  no  2. Patient or partner has history of genital herpes  no 3. Rash or viral illness since LMP  no 4. History of STI (GC, CT, HPV, syphilis, HIV)  no 5. History of recent travel :  no  Other pertinent information:  [employer] employee     Review of Systems:10 point review of systems negative unless otherwise noted in HPI  Past Medical History:  Patient Active Problem List   Diagnosis Date Noted  . Supervision of high risk pregnancy in first trimester 08/24/2020  . Nausea and vomiting during pregnancy 08/24/2020  . Headache in pregnancy, antepartum, first  trimester 08/24/2020  . 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 S/P conization of cervix 11/23/2018    Past Surgical History:  Past Surgical History:  Procedure Laterality Date  . CERVICAL CONIZATION W/BX N/A 11/23/2018   Procedure: COLD KNIFE CONIZATION CERVIX WITH BIOPSY;  Surgeon: Rubie Maid, MD;  Location: [facility];  Service: Gynecology;  Laterality: N/A;  . CHOLECYSTECTOMY    . ESOPHAGOGASTRODUODENOSCOPY (EGD) WITH PROPOFOL N/A 08/06/2017   Procedure: ESOPHAGOGASTRODUODENOSCOPY (EGD) WITH PROPOFOL;  Surgeon: Lucilla Lame, MD;  Location: [place];  Service: Endoscopy;  Laterality: N/A;  . TONSILLECTOMY      Gynecologic History: Patient's last menstrual period was 07/05/2020.  Obstetric History: [id number]  Family History:  Family History  Problem Relation Age of Onset  . Diabetes Mother   . Heart attack Father   . Diabetes Paternal Uncle   . Diabetes Maternal Grandmother   . Pancreatitis Maternal Grandmother   . Breast cancer Neg Hx   . Ovarian cancer Neg Hx   . Colon cancer Neg Hx     Social History:  Social History   Socioeconomic History  . Marital status: Married    Spouse name: Not on file  . Number of children: Not on file  . Years of education: Not on file  . Highest education level: Not on file  Occupational History  . Not on file  Tobacco Use  . Smoking status: Never Smoker  . Smokeless tobacco: Never Used  Vaping Use  . Vaping Use: Never used  Substance and Sexual Activity  . Alcohol use: No  . Drug use: No  . Sexual activity: Not Currently    Birth control/protection: None  Other Topics Concern  . Not on file  Social History Narrative   Lives at home, independent   Social Determinants of Health   Financial Resource Strain: Not on file  Food Insecurity: Not on file  Transportation Needs: Not on file  Physical Activity: Not on file  Stress: Not on file  Social Connections: Not on file  Intimate Partner Violence: Not on file    Allergies:  Allergies  Allergen Reactions  . Ampicillin Rash    Did it involve swelling of the face/tongue/throat, SOB, or low BP? No Did it involve sudden or severe rash/hives, skin peeling, or any reaction on the inside of your mouth or nose? No Did you need to seek medical attention at a hospital or doctor's office? No When did it last happen?3-4 years ago If all above answers are "NO", may proceed with cephalosporin use.  Marland Kitchen Morphine And Related Itching and Rash  . Penicillins Rash    Did it involve swelling of the face/tongue/throat, SOB,  or low BP? No Did it involve sudden or severe rash/hives, skin peeling, or any reaction on the inside of your mouth or nose? No Did you need to seek medical attention at a hospital or doctor's office? No When did it last happen?3-4 years ago If all above answers are "NO", may proceed with cephalosporin use.   . Sulfa Antibiotics Rash    Medications: Prior to Admission medications   Medication Sig Start Date End Date Taking? Authorizing Provider  Butalbital-APAP-Caffeine 50-325-40 MG capsule Take 1-2 capsules by mouth every 6 (six) hours as needed for headache. 08/24/20   Orlie Pollen, CNM  Doxylamine-Pyridoxine (DICLEGIS) 10-10 MG TBEC Take 2 tablets by mouth at bedtime. If symptoms persist, add one tablet in the morning and one in the afternoon 08/24/20   Faisal Stradling,  Kiaraliz Rafuse, CNM  Prenatal Vit-Fe Fumarate-FA (PRENATAL MULTIVITAMIN) TABS tablet Take 1 tablet by mouth daily at 12 noon.    [provider]    Physical Exam Vitals: Blood pressure 100/60, weight 151 lb (68.5 kg), last menstrual period 07/05/2020, unknown if currently breastfeeding.  General: NAD HEENT: normocephalic, anicteric Thyroid: no enlargement, no palpable nodules Pulmonary: No increased work of breathing, CTAB Cardiovascular: RRR, distal pulses 2+ Abdomen: NABS, soft, non-tender, non-distended.  Umbilicus without lesions.  No hepatomegaly, splenomegaly or masses palpable. No evidence of hernia  Genitourinary:  External: Normal external female genitalia.  Normal urethral meatus, normal  Bartholin's and Skene's glands.    Vagina: Normal vaginal mucosa, no evidence of prolapse.    Cervix: Grossly normal in appearance, no bleeding  Uterus: Enlarged - size=8 weeks, mobile, normal contour.  No CMT  Adnexa: ovaries non-enlarged, no adnexal masses  Rectal: deferred Extremities: no edema, erythema, or tenderness Neurologic: Grossly intact Psychiatric: mood appropriate, affect full   Assessment: 34 y.o. [id number]  at [redacted]w[redacted]d presenting to initiate prenatal care  Plan: 1) Avoid alcoholic beverages. 2) Patient encouraged not to smoke.  3) Discontinue the use of all non-medicinal drugs and chemicals.  4) Take prenatal vitamins daily.  5) Nutrition, food safety (fish, cheese advisories, and high nitrite foods) and exercise discussed. 6) Hospital and practice style discussed with cross coverage system.  7) Genetic Screening, such as with 1st Trimester Screening, cell free fetal DNA, AFP testing, and Ultrasound, as well as with amniocentesis and CVS as appropriate, is discussed with patient. At the conclusion of today's visit patient requested genetic testing 8) Patient is asked about travel to areas at risk for the Zika virus, and counseled to avoid travel and exposure to mosquitoes or sexual partners who may have themselves been exposed to the virus. Testing is discussed, and will be ordered as appropriate.  9) Headaches in pregnancy - Fioricet prn 10) N/V in pregnancy - diclegis  11) Pap collected today 12) NOB labs today 3) Dating [place] today  Orlie Pollen, CNM, MSN [facility], [facility] 08/24/2020, 4:05 PM

## 2020-08-25 ENCOUNTER — Telehealth: Payer: Self-pay | Admitting: Obstetrics and Gynecology

## 2020-08-25 LAB — RPR+RH+ABO+RUB AB+AB SCR+CB...
Antibody Screen: NEGATIVE
HIV Screen 4th Generation wRfx: NONREACTIVE
Hematocrit: 38.3 % (ref 34.0–46.6)
Hemoglobin: 12.6 g/dL (ref 11.1–15.9)
Hepatitis B Surface Ag: NEGATIVE
MCH: 29.9 pg (ref 26.6–33.0)
MCHC: 32.9 g/dL (ref 31.5–35.7)
MCV: 91 fL (ref 79–97)
Platelets: 353 10*3/uL (ref 150–450)
RBC: 4.21 x10E6/uL (ref 3.77–5.28)
RDW: 12.4 % (ref 11.7–15.4)
RPR Ser Ql: NONREACTIVE
Rh Factor: POSITIVE
Rubella Antibodies, IGG: 8.13 index (ref >0.99)
Varicella zoster IgG: 710 index (ref >165)
WBC: 8.8 10*3/uL (ref 3.4–10.8)

## 2020-08-25 NOTE — Telephone Encounter (Signed)
Reviewed imaging results from dating [place]. Discussed findings with Dr. Glennon Mac. Called patient to review results and plan of care. Patient stated understanding. Will plan for 16 week cervical length [place]. Reviewed use of Makena injection for hx of PTL. Will discuss further at future visit. Keep previously scheduled follow-up.

## 2020-08-27 LAB — URINE CULTURE

## 2020-08-28 LAB — URINE DRUG PANEL 7
Amphetamines, Urine: NEGATIVE ng/mL
Barbiturate Quant, Ur: NEGATIVE ng/mL
Benzodiazepine Quant, Ur: NEGATIVE ng/mL
Cannabinoid Quant, Ur: NEGATIVE ng/mL
Cocaine (Metab.): NEGATIVE ng/mL
Opiate Quant, Ur: NEGATIVE ng/mL
PCP Quant, Ur: NEGATIVE ng/mL

## 2020-08-30 LAB — PAP IG AND CT-NG NAA
Chlamydia, Nuc. Acid Amp: NEGATIVE
Gonococcus by Nucleic Acid Amp: NEGATIVE

## 2020-08-30 LAB — GC/CHLAMYDIA PROBE AMP
Chlamydia trachomatis, NAA: NEGATIVE
Neisseria Gonorrhoeae by PCR: NEGATIVE

## 2020-09-06 ENCOUNTER — Other Ambulatory Visit: Payer: Self-pay | Admitting: Obstetrics and Gynecology

## 2020-09-06 DIAGNOSIS — Z1379 Encounter for other screening for genetic and chromosomal anomalies: Secondary | ICD-10-CM

## 2020-09-13 NOTE — Telephone Encounter (Signed)
I would recommend urgent care if she wants to be evaluated quickly. I'm out for the rest of the week - if she wants to be seen by someone else she can.

## 2020-09-21 ENCOUNTER — Other Ambulatory Visit: Payer: Self-pay

## 2020-09-21 ENCOUNTER — Encounter: Payer: Self-pay | Admitting: Obstetrics & Gynecology

## 2020-09-21 ENCOUNTER — Ambulatory Visit (INDEPENDENT_AMBULATORY_CARE_PROVIDER_SITE_OTHER): Payer: Managed Care, Other (non HMO) | Admitting: Obstetrics & Gynecology

## 2020-09-21 VITALS — BP 114/70 | Wt 148.0 lb

## 2020-09-21 DIAGNOSIS — Z3A11 11 weeks gestation of pregnancy: Secondary | ICD-10-CM

## 2020-09-21 DIAGNOSIS — O09211 Supervision of pregnancy with history of pre-term labor, first trimester: Secondary | ICD-10-CM

## 2020-09-21 DIAGNOSIS — Z1379 Encounter for other screening for genetic and chromosomal anomalies: Secondary | ICD-10-CM

## 2020-09-21 DIAGNOSIS — O0991 Supervision of high risk pregnancy, unspecified, first trimester: Secondary | ICD-10-CM

## 2020-09-21 NOTE — Progress Notes (Signed)
  Subjective  Min nausea, no pain or bleeding  Objective  BP 114/70   Wt 148 lb (67.1 kg)   LMP 07/05/2020   BMI 28.90 kg/m  General: NAD Pumonary: no increased work of breathing Abdomen: gravid, non-tender Extremities: no edema Psychiatric: mood appropriate, affect full  Assessment  35 y.o. [id number] at [redacted]w[redacted]d by  04/11/2021, by Last Menstrual Period presenting for routine prenatal visit  Plan   Problem List Items Addressed This Visit      Other   Supervision of high risk pregnancy in first trimester    Other Visit Diagnoses    [redacted] weeks gestation of pregnancy    -  Primary   Encounter for genetic screening for Down Syndrome       History of preterm labor, current pregnancy, first trimester       Relevant Orders   US OB Follow Up    Discussed risks for PTL, PTD discussed   Prior PTD x2   Prior CKC 2020 Plan cervical checks from 16-22 weeks   Consider Makena.  Discussed pros and cons today.   Consider vag progesterone therapy based on [place] cervical lengths.   Also can consider cerclage. Pt has not used Makena in past, is still deciding what she would like to do  PNV NIPT today   Annamarie Major, MD, [facility], [facility] 09/21/2020  4:41 PM

## 2020-09-21 NOTE — Patient Instructions (Signed)
Thank you for choosing [facility]. As part of our ongoing efforts to improve patient experience, we would appreciate your feedback. Please fill out the short survey that you will receive by mail or MyChart. Your opinion is important to us! -Dr Eugean Arnott  Recommendations to boost your immunity to prevent illness such as viral flu and colds, including covid19, are as follows:       - - -  Vitamin K2 and Vitamin D3  - - - Take Vitamin K2 at 200-300 mcg daily (usually 2-3 pills daily of the over the counter formulation). Take Vitamin D3 at 3000-4000 U daily (usually 3-4 pills daily of the over the counter formulation). Studies show that these two at high normal levels in your system are very effective in keeping your immunity so strong and protective that you will be unlikely to contract viral illness such as those listed above.  Dr Alyxander Kollmann  

## 2020-09-25 LAB — MATERNIT 21 PLUS CORE, BLOOD
Fetal Fraction: 7
Result (T21): NEGATIVE
Trisomy 13 (Patau syndrome): NEGATIVE
Trisomy 18 (Edwards syndrome): NEGATIVE
Trisomy 21 (Down syndrome): NEGATIVE

## 2020-10-12 ENCOUNTER — Other Ambulatory Visit: Payer: Self-pay

## 2020-10-12 ENCOUNTER — Ambulatory Visit (INDEPENDENT_AMBULATORY_CARE_PROVIDER_SITE_OTHER): Payer: Managed Care, Other (non HMO) | Admitting: Obstetrics and Gynecology

## 2020-10-12 VITALS — BP 102/64 | Wt 150.5 lb

## 2020-10-12 DIAGNOSIS — O0992 Supervision of high risk pregnancy, unspecified, second trimester: Secondary | ICD-10-CM

## 2020-10-12 DIAGNOSIS — Z3A14 14 weeks gestation of pregnancy: Secondary | ICD-10-CM

## 2020-10-12 DIAGNOSIS — O0991 Supervision of high risk pregnancy, unspecified, first trimester: Secondary | ICD-10-CM

## 2020-10-12 DIAGNOSIS — D061 Carcinoma in situ of exocervix: Secondary | ICD-10-CM

## 2020-10-12 DIAGNOSIS — O09899 Supervision of other high risk pregnancies, unspecified trimester: Secondary | ICD-10-CM | POA: Insufficient documentation

## 2020-10-12 DIAGNOSIS — Z9889 Other specified postprocedural states: Secondary | ICD-10-CM

## 2020-10-12 LAB — POCT URINALYSIS DIPSTICK OB
Glucose, UA: NEGATIVE
POC,PROTEIN,UA: NEGATIVE

## 2020-10-12 NOTE — Progress Notes (Signed)
Routine Prenatal Care Visit  Subjective  Alexandria Orozco is a 35 y.o. [phone] at [redacted]w[redacted]d being seen today for ongoing prenatal care.  She is currently monitored for the following issues for this high-risk pregnancy and has Carcinoma in situ of exocervix; S/P conization of cervix; Supervision of high risk pregnancy in first trimester; Nausea and vomiting during pregnancy; Headache in pregnancy, antepartum, first trimester; and History of preterm delivery, currently pregnant on their problem list.  ----------------------------------------------------------------------------------- Patient reports no complaints.   Contractions: Not present. Vag. Bleeding: None.  Movement: Absent. Denies leaking of fluid.  ----------------------------------------------------------------------------------- The following portions of the patient's history were reviewed and updated as appropriate: allergies, current medications, past family history, past medical history, past social history, past surgical history and problem list. Problem list updated.   Objective  Blood pressure 102/64, weight 150 lb 8 oz (68.3 kg), last menstrual period 07/05/2020, unknown if currently breastfeeding. Pregravid weight 148 lb (67.1 kg) Total Weight Gain 2 lb 8 oz (1.134 kg) Urinalysis:      Fetal Status: Fetal Heart Rate (bpm): 160   Movement: Absent     General:  Alert, oriented and cooperative. Patient is in no acute distress.  Skin: Skin is warm and dry. No rash noted.   Cardiovascular: Normal heart rate noted  Respiratory: Normal respiratory effort, no problems with respiration noted  Abdomen: Soft, gravid, appropriate for gestational age. Pain/Pressure: Absent     Pelvic:  Cervical exam deferred        Extremities: Normal range of motion.     ental Status: Normal mood and affect. Normal behavior. Normal judgment and thought content.     Assessment   35 y.o. [id number] at [redacted]w[redacted]d by  04/11/2021, by Last Menstrual Period  presenting for routine prenatal visit  Plan   pregnancy5 Problems (from 07/05/20 to present)    Problem Noted Resolved   Supervision of high risk pregnancy in first trimester 08/24/2020 by Orlie Pollen, CNM No   Overview Signed 08/24/2020  4:14 PM by Orlie Pollen, [place] Prenatal Labs  Dating  LMP = [redacted]w[redacted]d [place] Blood type:   O pos  Genetic Screen  NIPS: normal XX Antibody: negative  Anatomic [place]  Rubella: Immune Varicella: Immune  GTT Early:  n/a            Third trimester:  RPR: NR  Rhogam N/A HBsAg: negative  TDaP vaccine                       Flu Shot: HIV: negative  Baby Food                                GBS:   Contraception  Pap: 11/04/2018 HSIL HPV positive, CKC 11/23/2018 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    1) History of PTB and prior CKC - discussed Makena, data for prevention of PTB has  been called into question but currently only FDA approved treatment option - discussed cerclage based on cervical length finding at 16 weeks - cervical length [place] ordered  Return in about 2 weeks (around 10/26/2020) for ROB and cervical length [place].  Malachy Mood, MD, [facility], [facility] 10/12/2020, 3:56 PM

## 2020-10-26 ENCOUNTER — Other Ambulatory Visit: Payer: Self-pay | Admitting: Obstetrics and Gynecology

## 2020-10-26 ENCOUNTER — Ambulatory Visit (INDEPENDENT_AMBULATORY_CARE_PROVIDER_SITE_OTHER): Payer: Managed Care, Other (non HMO) | Admitting: Obstetrics & Gynecology

## 2020-10-26 ENCOUNTER — Encounter: Payer: Self-pay | Admitting: Obstetrics & Gynecology

## 2020-10-26 ENCOUNTER — Ambulatory Visit (INDEPENDENT_AMBULATORY_CARE_PROVIDER_SITE_OTHER): Payer: Managed Care, Other (non HMO)

## 2020-10-26 ENCOUNTER — Other Ambulatory Visit: Payer: Self-pay

## 2020-10-26 VITALS — BP 100/60 | Wt 155.0 lb

## 2020-10-26 DIAGNOSIS — O0991 Supervision of high risk pregnancy, unspecified, first trimester: Secondary | ICD-10-CM | POA: Diagnosis not present

## 2020-10-26 DIAGNOSIS — D061 Carcinoma in situ of exocervix: Secondary | ICD-10-CM

## 2020-10-26 DIAGNOSIS — Z9889 Other specified postprocedural states: Secondary | ICD-10-CM

## 2020-10-26 DIAGNOSIS — R350 Frequency of micturition: Secondary | ICD-10-CM

## 2020-10-26 DIAGNOSIS — O09899 Supervision of other high risk pregnancies, unspecified trimester: Secondary | ICD-10-CM

## 2020-10-26 DIAGNOSIS — Z3A16 16 weeks gestation of pregnancy: Secondary | ICD-10-CM

## 2020-10-26 DIAGNOSIS — O09211 Supervision of pregnancy with history of pre-term labor, first trimester: Secondary | ICD-10-CM

## 2020-10-26 LAB — POCT URINALYSIS DIPSTICK
Bilirubin, UA: NEGATIVE
Blood, UA: NEGATIVE
Glucose, UA: NEGATIVE
Ketones, UA: NEGATIVE
Leukocytes, UA: NEGATIVE
Nitrite, UA: NEGATIVE
Protein, UA: NEGATIVE
Spec Grav, UA: 1.01 (ref 1.010–1.025)
Urobilinogen, UA: 0.2 E.U./dL
pH, UA: 5 (ref 5.0–8.0)

## 2020-10-26 NOTE — Progress Notes (Signed)
  Subjective  Denies pain, bleeding, discharge, nausea  Objective  BP 100/60   Wt 155 lb (70.3 kg)   LMP 07/05/2020   BMI 30.27 kg/m  General: NAD Pumonary: no increased work of breathing Abdomen: gravid, non-tender Extremities: no edema Psychiatric: mood appropriate, affect full  Review of ULTRASOUND.    I have personally reviewed images and report of recent ultrasound done [facility].    Plan of management to be discussed with patient. Cervical length 2.8 cm, no funneling  Assessment  35 y.o. [id number] at [redacted]w[redacted]d by  04/11/2021, by Last Menstrual Period presenting for routine prenatal visit  Plan   Problem List Items Addressed This Visit      Other   S/P conization of cervix   Relevant Orders   US OB LESS THAN 14 WEEKS WITH OB TRANSVAGINAL in 2 weeks, repeat every 2 weeks until after 22 weeks COUNSELED on findings today and continued risks of cervical incompetence Vag Progesterone as an option if shortening occurs Pt declines Makena option    Other Visit Diagnoses    Urine frequency    -  Primary   Relevant Orders   POCT urinalysis dipstick (Completed)   [redacted] weeks gestation of pregnancy           Clinic [facility] Pren[facility]  Dating LMP = [redacted]w[redacted]d Blood type: O pos  Genetic Screen NIPS: normal XX Antibody: negative  Anatomic [place]  Rubella: Immune Varicella: Immune  GTT Early:  n/a            Third trimester:  RPR: NR  Rhogam N/A HBsAg: negative  TDaP vaccine                       Flu Shot: HIV: negative  Baby Food                                GBS:   Contraception  Pap: 11/04/2018 HSIL HPV positive, CKC 11/23/2018 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

## 2020-11-08 ENCOUNTER — Other Ambulatory Visit: Payer: Self-pay | Admitting: Obstetrics & Gynecology

## 2020-11-08 DIAGNOSIS — Z3686 Encounter for antenatal screening for cervical length: Secondary | ICD-10-CM

## 2020-11-08 DIAGNOSIS — Z9889 Other specified postprocedural states: Secondary | ICD-10-CM

## 2020-11-09 ENCOUNTER — Ambulatory Visit: Payer: Managed Care, Other (non HMO)

## 2020-11-09 ENCOUNTER — Encounter: Payer: Managed Care, Other (non HMO) | Admitting: Obstetrics and Gynecology

## 2020-11-09 DIAGNOSIS — Z9889 Other specified postprocedural states: Secondary | ICD-10-CM

## 2020-11-10 ENCOUNTER — Other Ambulatory Visit: Payer: Self-pay

## 2020-11-10 ENCOUNTER — Ambulatory Visit
Admission: RE | Admit: 2020-11-10 | Discharge: 2020-11-10 | Disposition: A | Discharge status: Home / Self Care | Payer: Managed Care, Other (non HMO) | Source: Ambulatory Visit | Attending: Obstetrics & Gynecology | Admitting: Obstetrics & Gynecology

## 2020-11-10 DIAGNOSIS — Z3686 Encounter for antenatal screening for cervical length: Secondary | ICD-10-CM | POA: Insufficient documentation

## 2020-11-10 DIAGNOSIS — Z9889 Other specified postprocedural states: Secondary | ICD-10-CM | POA: Insufficient documentation

## 2020-11-13 ENCOUNTER — Other Ambulatory Visit: Payer: Self-pay

## 2020-11-13 ENCOUNTER — Ambulatory Visit (INDEPENDENT_AMBULATORY_CARE_PROVIDER_SITE_OTHER): Payer: Managed Care, Other (non HMO) | Admitting: Obstetrics and Gynecology

## 2020-11-13 VITALS — BP 100/60 | Wt 156.0 lb

## 2020-11-13 DIAGNOSIS — O0991 Supervision of high risk pregnancy, unspecified, first trimester: Secondary | ICD-10-CM

## 2020-11-13 DIAGNOSIS — Z3689 Encounter for other specified antenatal screening: Secondary | ICD-10-CM

## 2020-11-13 DIAGNOSIS — Z3A18 18 weeks gestation of pregnancy: Secondary | ICD-10-CM

## 2020-11-13 LAB — POCT URINALYSIS DIPSTICK OB
Glucose, UA: NEGATIVE
POC,PROTEIN,UA: NEGATIVE

## 2020-11-13 NOTE — Progress Notes (Signed)
Routine Prenatal Care Visit  Subjective  Alexandria Orozco is a 35 y.o. [phone] at [redacted]w[redacted]d being seen today for ongoing prenatal care.  She is currently monitored for the following issues for this high-risk pregnancy and has Carcinoma in situ of exocervix; S/P conization of cervix; Supervision of high risk pregnancy in first trimester; Nausea and vomiting during pregnancy; Headache in pregnancy, antepartum, first trimester; and History of preterm delivery, currently pregnant on their problem list.  ----------------------------------------------------------------------------------- Patient reports no complaints.   Contractions: Not present. Vag. Bleeding: None.  Movement: Present. Denies leaking of fluid.  ----------------------------------------------------------------------------------- The following portions of the patient's history were reviewed and updated as appropriate: allergies, current medications, past family history, past medical history, past social history, past surgical history and problem list. Problem list updated.   Objective  Blood pressure 100/60, weight 156 lb (70.8 kg), last menstrual period 07/05/2020, unknown if currently breastfeeding. Pregravid weight 148 lb (67.1 kg) Total Weight Gain 8 lb (3.629 kg) Urinalysis:      Fetal Status: Fetal Heart Rate (bpm): 150   Movement: Present     General:  Alert, oriented and cooperative. Patient is in no acute distress.  Skin: Skin is warm and dry. No rash noted.   Cardiovascular: Normal heart rate noted  Respiratory: Normal respiratory effort, no problems with respiration noted  Abdomen: Soft, gravid, appropriate for gestational age. Pain/Pressure: Absent     Pelvic:  Cervical exam deferred        Extremities: Normal range of motion.     ental Status: Normal mood and affect. Normal behavior. Normal judgment and thought content.     Assessment   35 y.o. [id number] at [redacted]w[redacted]d by  04/11/2021, by Last Menstrual Period presenting  for routine prenatal visit  Plan   pregnancy5 Problems (from 07/05/20 to present)    Problem Noted Resolved   Supervision of high risk pregnancy in first trimester 08/24/2020 by Orlie Pollen, CNM No   Overview Addendum 10/14/2020  3:49 PM by Malachy Mood, MD    Clinic [facility] Prenatal Labs  Dating LMP = [redacted]w[redacted]d Blood type: O pos  Genetic Screen NIPS: normal XX Antibody: negative  Anatomic [place]  Rubella: Immune Varicella: Immune  GTT   Third trimester:  RPR: NR  Rhogam N/A HBsAg: negative  TDaP vaccine                        Flu Shot: declines HIV: negative  Baby Food   breast                               GBS:   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    Return in about 2 weeks (around 11/27/2020) for ROB with anatomy scan (with cervical length -  needs this in 2 weeks).  Orlie Pollen, CNM, MSN [facility], [facility] 11/13/2020, 11:29 AM

## 2020-11-17 ENCOUNTER — Other Ambulatory Visit: Payer: Self-pay | Admitting: Obstetrics and Gynecology

## 2020-11-17 DIAGNOSIS — Z3689 Encounter for other specified antenatal screening: Secondary | ICD-10-CM

## 2020-11-28 ENCOUNTER — Ambulatory Visit (INDEPENDENT_AMBULATORY_CARE_PROVIDER_SITE_OTHER): Payer: Managed Care, Other (non HMO)

## 2020-11-28 ENCOUNTER — Other Ambulatory Visit: Payer: Self-pay

## 2020-11-28 ENCOUNTER — Ambulatory Visit (INDEPENDENT_AMBULATORY_CARE_PROVIDER_SITE_OTHER): Payer: Managed Care, Other (non HMO) | Admitting: Obstetrics

## 2020-11-28 VITALS — BP 100/60 | Wt 153.0 lb

## 2020-11-28 DIAGNOSIS — O0991 Supervision of high risk pregnancy, unspecified, first trimester: Secondary | ICD-10-CM

## 2020-11-28 DIAGNOSIS — Z3689 Encounter for other specified antenatal screening: Secondary | ICD-10-CM

## 2020-11-28 DIAGNOSIS — Z3A20 20 weeks gestation of pregnancy: Secondary | ICD-10-CM

## 2020-11-28 NOTE — Progress Notes (Signed)
Routine Prenatal Care Visit  Subjective  Alexandria Orozco is a 35 y.o. [phone] at [redacted]w[redacted]d being seen today for ongoing prenatal care.  She is currently monitored for the following issues for this high-risk pregnancy and has Carcinoma in situ of exocervix; S/P conization of cervix; Supervision of high risk pregnancy in first trimester; Nausea and vomiting during pregnancy; Headache in pregnancy, antepartum, first trimester; and History of preterm delivery, currently pregnant on their problem list.  ----------------------------------------------------------------------------------- Patient reports no complaints.   Contractions: Not present. Vag. Bleeding: None.  Movement: Present. Leaking Fluid denies.  ----------------------------------------------------------------------------------- The following portions of the patient's history were reviewed and updated as appropriate: allergies, current medications, past family history, past medical history, past social history, past surgical history and problem list. Problem list updated.  Objective  Blood pressure 100/60, weight 153 lb (69.4 kg), last menstrual period 07/05/2020, unknown if currently breastfeeding. Pregravid weight 148 lb (67.1 kg) Total Weight Gain 5 lb (2.268 kg) Urinalysis: Urine Protein    Urine Glucose    Fetal Status:     Movement: Present     General:  Alert, oriented and cooperative. Patient is in no acute distress.  Skin: Skin is warm and dry. No rash noted.   Cardiovascular: Normal heart rate noted  Respiratory: Normal respiratory effort, no problems with respiration noted  Abdomen: Soft, gravid, appropriate for gestational age. Pain/Pressure: Present     Pelvic:  Cervical exam deferred        Extremities: Normal range of motion.     Mental Status: Normal mood and affect. Normal behavior. Normal judgment and thought content.   Assessment   35 y.o. [id number] at [redacted]w[redacted]d by  04/11/2021, by Last Menstrual Period presenting for  routine prenatal visit  Plan   pregnancy5 Problems (from 07/05/20 to present)    Problem Noted Resolved   Supervision of high risk pregnancy in first trimester 08/24/2020 by Orlie Pollen, CNM No   Overview Addendum 11/13/2020 11:31 AM by Orlie Pollen, [place] Prenatal Labs  Dating LMP = [redacted]w[redacted]d Blood type: O pos  Genetic Screen NIPS: normal XX Antibody: negative  Anatomic [place]  Rubella: Immune Varicella: Immune  GTT  Third trimester:  RPR: NR  Rhogam N/A HBsAg: negative  TDaP vaccine                        Flu Shot: declines HIV: negative  Baby Food   breast                              GBS:   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 Please refer to After Visit Summary for other counseling recommendations.  We reviewed her cervical length scan today.  Having some acid reflux and restless legs. To trial daily Zantac and some magnesium for the legs.  Return in about 4 weeks (around 12/26/2020) for return OB.  Imagene Riches, CNM  11/28/2020 2:35 PM

## 2020-12-26 ENCOUNTER — Ambulatory Visit (INDEPENDENT_AMBULATORY_CARE_PROVIDER_SITE_OTHER): Payer: Managed Care, Other (non HMO) | Admitting: Obstetrics & Gynecology

## 2020-12-26 ENCOUNTER — Encounter: Payer: Self-pay | Admitting: Obstetrics & Gynecology

## 2020-12-26 ENCOUNTER — Other Ambulatory Visit: Payer: Self-pay

## 2020-12-26 VITALS — BP 90/60 | Wt 162.0 lb

## 2020-12-26 DIAGNOSIS — O0992 Supervision of high risk pregnancy, unspecified, second trimester: Secondary | ICD-10-CM

## 2020-12-26 DIAGNOSIS — Z3A24 24 weeks gestation of pregnancy: Secondary | ICD-10-CM

## 2020-12-26 DIAGNOSIS — Z9889 Other specified postprocedural states: Secondary | ICD-10-CM

## 2020-12-26 DIAGNOSIS — Z131 Encounter for screening for diabetes mellitus: Secondary | ICD-10-CM

## 2020-12-26 DIAGNOSIS — O09899 Supervision of other high risk pregnancies, unspecified trimester: Secondary | ICD-10-CM

## 2020-12-26 LAB — POCT URINALYSIS DIPSTICK OB
Glucose, UA: NEGATIVE
POC,PROTEIN,UA: NEGATIVE

## 2020-12-26 NOTE — Addendum Note (Signed)
Addended by: Quintella Baton D on: 12/26/2020 05:05 PM   Modules accepted: Orders

## 2020-12-26 NOTE — Progress Notes (Signed)
  Subjective  Fetal Movement? yes Contractions? No; occas crampy lower abdominal pains Leaking Fluid? no Vaginal Bleeding? no  Objective  BP 90/60   Wt 162 lb (73.5 kg)   LMP 07/05/2020   BMI 31.64 kg/m  General: NAD Pumonary: no increased work of breathing Abdomen: gravid, non-tender Extremities: no edema Psychiatric: mood appropriate, affect full  Assessment  34 y.o. [id number] at [redacted]w[redacted]d by  04/11/2021, by Last Menstrual Period presenting for routine prenatal visit  Plan   Problem List Items Addressed This Visit      Other   S/P conization of cervix   Supervision of high risk pregnancy in first trimester   History of preterm delivery, currently pregnant    Other Visit Diagnoses    [redacted] weeks gestation of pregnancy    -  Primary      pregnancy5 Problems (from 07/05/20 to present)    Problem Noted Resolved   Supervision of high risk pregnancy in first trimester 08/24/2020 by Orlie Pollen, CNM No   Overview Addendum 11/13/2020 11:31 AM by Orlie Pollen, [place] Prenatal Labs  Dating LMP = [redacted]w[redacted]d Blood type: O pos  Genetic Screen NIPS: normal XX Antibody: negative  Anatomic [place] [facility] nml Rubella: Immune Varicella: Immune  GTT  Third trimester:  RPR: NR  Rhogam N/A HBsAg: negative  TDaP vaccine                        Flu Shot: declines HIV: negative  Baby Food   breast                              GBS:   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

## 2021-01-24 ENCOUNTER — Ambulatory Visit (INDEPENDENT_AMBULATORY_CARE_PROVIDER_SITE_OTHER): Payer: Managed Care, Other (non HMO) | Admitting: Advanced Practice Midwife

## 2021-01-24 ENCOUNTER — Other Ambulatory Visit: Payer: Self-pay

## 2021-01-24 ENCOUNTER — Encounter: Payer: Self-pay | Admitting: Advanced Practice Midwife

## 2021-01-24 VITALS — BP 114/69 | Wt 168.0 lb

## 2021-01-24 DIAGNOSIS — Z3A29 29 weeks gestation of pregnancy: Secondary | ICD-10-CM

## 2021-01-24 DIAGNOSIS — Z131 Encounter for screening for diabetes mellitus: Secondary | ICD-10-CM

## 2021-01-24 DIAGNOSIS — O09899 Supervision of other high risk pregnancies, unspecified trimester: Secondary | ICD-10-CM

## 2021-01-24 DIAGNOSIS — O0993 Supervision of high risk pregnancy, unspecified, third trimester: Secondary | ICD-10-CM

## 2021-01-24 LAB — POCT URINALYSIS DIPSTICK OB
Glucose, UA: NEGATIVE
POC,PROTEIN,UA: NEGATIVE

## 2021-01-24 NOTE — Progress Notes (Signed)
Routine Prenatal Care Visit  Subjective  Alexandria Orozco is a 35 y.o. [phone] at [redacted]w[redacted]d being seen today for ongoing prenatal care.  She is currently monitored for the following issues for this high-risk pregnancy and has Carcinoma in situ of exocervix; S/P conization of cervix; Supervision of high risk pregnancy in first trimester; Nausea and vomiting during pregnancy; Headache in pregnancy, antepartum, first trimester; and History of preterm delivery, currently pregnant on their problem list.  ----------------------------------------------------------------------------------- Patient reports some lower abdominal cramping and braxton hicks.   Contractions: Not present. Vag. Bleeding: None.  Movement: Present. Leaking Fluid denies.  ----------------------------------------------------------------------------------- The following portions of the patient's history were reviewed and updated as appropriate: allergies, current medications, past family history, past medical history, past social history, past surgical history and problem list. Problem list updated.  Objective  Blood pressure 114/69, weight 168 lb (76.2 kg), last menstrual period 07/05/2020, unknown if currently breastfeeding. Pregravid weight 148 lb (67.1 kg) Total Weight Gain 20 lb (9.072 kg) Urinalysis: Urine Protein Negative  Urine Glucose Negative  Fetal Status: Fetal Heart Rate (bpm): 138 Fundal Height: 29 cm Movement: Present     General:  Alert, oriented and cooperative. Patient is in no acute distress.  Skin: Skin is warm and dry. No rash noted.   Cardiovascular: Normal heart rate noted  Respiratory: Normal respiratory effort, no problems with respiration noted  Abdomen: Soft, gravid, appropriate for gestational age. Pain/Pressure: Present     Pelvic:  Cervical exam deferred        Extremities: Normal range of motion.  Edema: None  Mental Status: Normal mood and affect. Normal behavior. Normal judgment and thought  content.   Assessment   34 y.o. [id number] at [redacted]w[redacted]d by  04/11/2021, by Last Menstrual Period presenting for routine prenatal visit  Plan   pregnancy5 Problems (from 07/05/20 to present)    Problem Noted Resolved   Supervision of high risk pregnancy in first trimester 08/24/2020 by Orlie Pollen, CNM No   Overview Addendum 11/13/2020 11:31 AM by Orlie Pollen, [place] Prenatal Labs  Dating LMP = 105w1d Blood type: O pos  Genetic Screen NIPS: normal XX Antibody: negative  Anatomic [place]  Rubella: Immune Varicella: Immune  GTT  Third trimester:  RPR: NR  Rhogam N/A HBsAg: negative  TDaP vaccine                        Flu Shot: declines HIV: negative  Baby Food   breast                              GBS:   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 Please refer to After Visit Summary for other counseling recommendations.   Return in about 2 weeks (around 02/07/2021) for rob.  Rod Can, CNM 01/24/2021 9:14 AM

## 2021-01-24 NOTE — Progress Notes (Signed)
ROB- 1 hr GTT, no concerns 

## 2021-01-29 LAB — 28 WEEK RH+PANEL
Basophils Absolute: 0 10*3/uL (ref 0.0–0.2)
Basos: 0 %
EOS (ABSOLUTE): 0 10*3/uL (ref 0.0–0.4)
Eos: 1 %
Gestational Diabetes Screen: 91 mg/dL (ref 65–139)
HIV Screen 4th Generation wRfx: NONREACTIVE
Hematocrit: 33.4 % — ABNORMAL LOW (ref 34.0–46.6)
Hemoglobin: 11.6 g/dL (ref 11.1–15.9)
Immature Grans (Abs): 0.1 10*3/uL (ref 0.0–0.1)
Immature Granulocytes: 1 %
Lymphocytes Absolute: 1.7 10*3/uL (ref 0.7–3.1)
Lymphs: 20 %
MCH: 32 pg (ref 26.6–33.0)
MCHC: 34.7 g/dL (ref 31.5–35.7)
MCV: 92 fL (ref 79–97)
Monocytes Absolute: 0.5 10*3/uL (ref 0.1–0.9)
Monocytes: 5 %
Neutrophils Absolute: 6.4 10*3/uL (ref 1.4–7.0)
Neutrophils: 73 %
Platelets: 301 10*3/uL (ref 150–450)
RBC: 3.62 x10E6/uL — ABNORMAL LOW (ref 3.77–5.28)
RDW: 12.3 % (ref 11.7–15.4)
RPR Ser Ql: NONREACTIVE
WBC: 8.7 10*3/uL (ref 3.4–10.8)

## 2021-02-07 ENCOUNTER — Ambulatory Visit (INDEPENDENT_AMBULATORY_CARE_PROVIDER_SITE_OTHER): Payer: Managed Care, Other (non HMO) | Admitting: Obstetrics and Gynecology

## 2021-02-07 ENCOUNTER — Other Ambulatory Visit: Payer: Self-pay

## 2021-02-07 VITALS — BP 98/60 | Wt 168.0 lb

## 2021-02-07 DIAGNOSIS — Z3A31 31 weeks gestation of pregnancy: Secondary | ICD-10-CM

## 2021-02-07 DIAGNOSIS — O0991 Supervision of high risk pregnancy, unspecified, first trimester: Secondary | ICD-10-CM

## 2021-02-07 DIAGNOSIS — O09899 Supervision of other high risk pregnancies, unspecified trimester: Secondary | ICD-10-CM

## 2021-02-07 DIAGNOSIS — Z23 Encounter for immunization: Secondary | ICD-10-CM | POA: Diagnosis not present

## 2021-02-07 DIAGNOSIS — O0993 Supervision of high risk pregnancy, unspecified, third trimester: Secondary | ICD-10-CM

## 2021-02-07 LAB — POCT URINALYSIS DIPSTICK OB
Glucose, UA: NEGATIVE
POC,PROTEIN,UA: NEGATIVE

## 2021-02-07 NOTE — Progress Notes (Signed)
Routine Prenatal Care Visit  Subjective  Alexandria Orozco is a 35 y.o. [phone] at [redacted]w[redacted]d being seen today for ongoing prenatal care.  She is currently monitored for the following issues for this low-risk pregnancy and has Carcinoma in situ of exocervix; S/P conization of cervix; Supervision of high risk pregnancy in first trimester; Nausea and vomiting during pregnancy; Headache in pregnancy, antepartum, first trimester; and History of preterm delivery, currently pregnant on their problem list.  ----------------------------------------------------------------------------------- Patient reports no complaints.   Contractions: Irritability. Vag. Bleeding: None.  Movement: Present. Denies leaking of fluid.  ----------------------------------------------------------------------------------- The following portions of the patient's history were reviewed and updated as appropriate: allergies, current medications, past family history, past medical history, past social history, past surgical history and problem list. Problem list updated.   Objective  Blood pressure 98/60, weight 168 lb (76.2 kg), last menstrual period 07/05/2020, unknown if currently breastfeeding. Pregravid weight 148 lb (67.1 kg) Total Weight Gain 20 lb (9.072 kg) Urinalysis:      Fetal Status: Fetal Heart Rate (bpm): 135 Fundal Height: 31 cm Movement: Present  Presentation: Vertex  General:  Alert, oriented and cooperative. Patient is in no acute distress.  Skin: Skin is warm and dry. No rash noted.   Cardiovascular: Normal heart rate noted  Respiratory: Normal respiratory effort, no problems with respiration noted  Abdomen: Soft, gravid, appropriate for gestational age. Pain/Pressure: Present     Pelvic:  Cervical exam deferred        Extremities: Normal range of motion.     ental Status: Normal mood and affect. Normal behavior. Normal judgment and thought content.   Immunization History  Administered Date(s)  Administered  . Influenza,inj,Quad PF,6+ Mos 06/08/2018  . PPD Test 08/17/2014  . Tdap 02/24/2018     Assessment   35 y.o. [id number] at [redacted]w[redacted]d by  04/11/2021, by Last Menstrual Period presenting for routine prenatal visit  Plan   pregnancy5 Problems (from 07/05/20 to present)    Problem Noted Resolved   Supervision of high risk pregnancy in first trimester 08/24/2020 by Orlie Pollen, CNM No   Overview Addendum 11/13/2020 11:31 AM by Orlie Pollen, [place] Prenatal Labs  Dating LMP = [redacted]w[redacted]d Blood type: O pos  Genetic Screen NIPS: normal XX Antibody: negative  Anatomic [place] Normal Rubella: Immune Varicella: Immune  GTT  Third trimester: 91 RPR: NR  Rhogam N/A HBsAg: negative  TDaP vaccine                        Flu Shot: declines HIV: negative  Baby Food   breast                              GBS:   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    Return in about 2 weeks (around 02/21/2021)  for ROB.  Malachy Mood, MD, [facility], [place] 02/07/2021, 8:30 AM

## 2021-02-07 NOTE — Addendum Note (Signed)
Addended by: Tamala Julian R on: 02/07/2021 08:56 AM   Modules accepted: Orders

## 2021-02-19 ENCOUNTER — Encounter: Payer: Self-pay | Admitting: Obstetrics and Gynecology

## 2021-02-19 ENCOUNTER — Observation Stay
Admission: EM | Admit: 2021-02-19 | Discharge: 2021-02-19 | Disposition: A | Discharge status: Home / Self Care | Payer: Managed Care, Other (non HMO) | Attending: Obstetrics and Gynecology | Admitting: Obstetrics and Gynecology

## 2021-02-19 DIAGNOSIS — O4703 False labor before 37 completed weeks of gestation, third trimester: Principal | ICD-10-CM

## 2021-02-19 DIAGNOSIS — Z3A32 32 weeks gestation of pregnancy: Secondary | ICD-10-CM | POA: Diagnosis not present

## 2021-02-19 DIAGNOSIS — O26891 Other specified pregnancy related conditions, first trimester: Secondary | ICD-10-CM

## 2021-02-19 DIAGNOSIS — O0991 Supervision of high risk pregnancy, unspecified, first trimester: Secondary | ICD-10-CM

## 2021-02-19 DIAGNOSIS — O219 Vomiting of pregnancy, unspecified: Secondary | ICD-10-CM

## 2021-02-19 DIAGNOSIS — O09893 Supervision of other high risk pregnancies, third trimester: Secondary | ICD-10-CM

## 2021-02-19 LAB — URINALYSIS, COMPLETE (UACMP) WITH MICROSCOPIC
Bacteria, UA: NONE SEEN
Bilirubin Urine: NEGATIVE
Glucose, UA: NEGATIVE mg/dL
Hgb urine dipstick: NEGATIVE
Ketones, ur: NEGATIVE mg/dL
Leukocytes,Ua: NEGATIVE
Nitrite: NEGATIVE
Protein, ur: NEGATIVE mg/dL
Specific Gravity, Urine: 1.023 (ref 1.005–1.030)
pH: 6 (ref 5.0–8.0)

## 2021-02-19 LAB — FETAL FIBRONECTIN: Fetal Fibronectin: NEGATIVE

## 2021-02-19 MED ORDER — ACETAMINOPHEN 325 MG PO TABS: 650.0000 mg | ORAL_TABLET | ORAL | Status: DC | PRN

## 2021-02-19 MED ORDER — BETAMETHASONE SOD PHOS & ACET 6 (3-3) MG/ML IJ SUSP
12.0000 mg | INTRAMUSCULAR | Status: DC
  Administered 2021-02-19: 12 mg via INTRAMUSCULAR
  Filled 2021-02-19: qty 5

## 2021-02-19 NOTE — H&P (Signed)
Obstetric H&P   Chief Complaint: Contractions   Prenatal Care Provider: [facility]  History of Present Illness: 35 y.o. [id number] [redacted]w[redacted]d by 04/11/2021, by Last Menstrual Period presenting to L&D with contractions.  Was on vacation in [place] when she started noting contractions every 4-5 min lasting about 45 seconds.  Not particularly strong or painful but noticeable.  She does have history of prior LEEP, two prior preterm births, normal cervical length at 16 weeks and anatomy scan, declined 17-P.  No recent intercourse.  Reports adequate hydration.  +FM, no LOF, no VB.    Pregravid weight 67.1 kg Total Weight Gain 4.99 kg  pregnancy5 Problems (from 07/05/20 to present)    Problem Noted Resolved   Supervision of high risk pregnancy in first trimester 08/24/2020 by Orlie Pollen, CNM No   Overview Addendum 02/07/2021  8:17 AM by Malachy Mood, MD    Clinic [facility] Prenatal Labs  Dating LMP = [redacted]w[redacted]d Blood type: O pos  Genetic Screen NIPS: normal XX Antibody: negative  Anatomic [place] Normal Rubella: Immune Varicella: Immune  GTT  Third trimester: 91 RPR: NR  Rhogam N/A HBsAg: negative  TDaP vaccine                        Flu Shot: declines HIV: negative  Baby Food   breast                              GBS:   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 Labor and delivery indication for care or intervention 02/19/2021  . History of preterm delivery, currently pregnant 10/12/2020   . 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 Nausea and vomiting during pregnancy 08/24/2020  . Headache in pregnancy, antepartum, first trimester 08/24/2020  . 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 S/P conization of cervix 11/23/2018    Past Surgical History: Past Surgical History:  Procedure Laterality Date  . CERVICAL CONIZATION W/BX N/A 11/23/2018   Procedure: COLD KNIFE CONIZATION CERVIX WITH BIOPSY;  Surgeon: Rubie Maid, MD;  Location: [facility];  Service: Gynecology;  Laterality: N/A;  . CHOLECYSTECTOMY    . ESOPHAGOGASTRODUODENOSCOPY (EGD) WITH PROPOFOL N/A 08/06/2017   Procedure: ESOPHAGOGASTRODUODENOSCOPY (EGD) WITH PROPOFOL;  Surgeon: Lucilla Lame, MD;  Location: [facility];  Service: Endoscopy;  Laterality: N/A;  . TONSILLECTOMY      Past Obstetric History: # 1 - Date: 07/10/07, Sex: Female, Weight: 2523 g, GA: None, Delivery: Vaginal, Spontaneous, Apgar1: None, Apgar5: None, Living: Living, Birth Comments: None  # 2 - Date: 08/08/16, Sex: Female, Weight: 2977 g, GA: None, Delivery: Vaginal, Spontaneous, Apgar1: None, Apgar5: None, Living: Living, Birth Comments: None  # 3 - Date: 04/06/18, Sex: Female, Weight: 2940 g, GA: [redacted]w[redacted]d, Delivery: Vaginal, Spontaneous, Apgar1: 8, Apgar5: 8, Living: Living, Birth Comments: None  # 4 - Date: 04/06/19, Sex: None, Weight: None,  GA: None, Delivery: None, Apgar1: None, Apgar5: None, Living: None, Birth Comments: None  # 5 - Date: None, Sex: None, Weight: None, GA: None, Delivery: None, Apgar1: None, Apgar5: None, Living: None, Birth Comments: None   Past Gynecologic History:  Family History: Family History  Problem Relation Age of Onset  . Diabetes Mother   . Heart attack Father   . Diabetes Paternal Uncle   . Diabetes Maternal Grandmother   . Pancreatitis Maternal Grandmother   . Breast cancer Neg Hx   . Ovarian cancer Neg Hx   . Colon cancer Neg Hx     Social History: Social History   Socioeconomic History  . Marital status: Married    Spouse name: Not on file  . Number of children: Not on file  . Years of education: Not on file  . Highest education level: Not on file  Occupational History  . Not on file  Tobacco Use  . Smoking status: Never Smoker  . Smokeless tobacco: Never Used  Vaping Use  . Vaping Use: Never used  Substance and Sexual Activity  . Alcohol use: No  . Drug use: No  . Sexual activity: Yes    Birth control/protection: None  Other Topics Concern  . Not on file  Social History Narrative   Lives at home, independent   Social Determinants of Health   Financial Resource Strain: Not on file  Food Insecurity: Not on file  Transportation Needs: Not on file  Physical Activity: Not on file  Stress: Not on file  Social Connections: Not on file  Intimate Partner Violence: Not on file    Medications: Prior to Admission medications   Medication Sig Start Date End Date Taking? Authorizing Provider  Prenatal Vit-Fe Fumarate-FA (PRENATAL MULTIVITAMIN) TABS tablet Take 1 tablet by mouth daily at 12 noon.    [provider]    Allergies: Allergies  Allergen Reactions  . Ampicillin Rash    Did it involve swelling of the face/tongue/throat, SOB, or low BP? No Did it involve sudden or severe rash/hives, skin peeling, or any reaction on the inside of your mouth or nose?  No Did you need to seek medical attention at a hospital or doctor's office? No When did it last happen?3-4 years ago If all above answers are "NO", may proceed with cephalosporin use.  Marland Kitchen Morphine And Related Itching and Rash  . Penicillins Rash    Did it involve swelling of the face/tongue/throat, SOB, or low BP? No Did it involve sudden or severe rash/hives,  skin peeling, or any reaction on the inside of your mouth or nose? No Did you need to seek medical attention at a hospital or doctor's office? No When did it last happen?3-4 years ago If all above answers are "NO", may proceed with cephalosporin use.   . Sulfa Antibiotics Rash    Physical Exam: Vitals: Blood pressure 111/70, pulse 80, temperature 98.5 F (36.9 C), temperature source Oral, resp. rate 16, height 5\' 1"  (1.549 m), weight 72.1 kg, last menstrual period 07/05/2020, unknown if currently breastfeeding.  FHT: 150, moderate, +accels, no decels Toco: irritability q2-2min  General: NAD HEENT: normocephalic, anicteric Pulmonary: No increased work of breathing Cardiovascular: RRR, distal pulses 2+ Abdomen: Gravid, non-tender [place]:vtx Genitourinary: closed and high Extremities: no edema, erythema, or tenderness Neurologic: Grossly intact Psychiatric: mood appropriate, affect full  Labs: No results found for this or any previous visit (from the past 24 hour(s)).  Assessment: 35 y.o. [id number] [redacted]w[redacted]d by 04/11/2021, by Last Menstrual Period presenting with preterm contractions  Plan: 1) Preterm contractions - in the setting of two prior preterm deliveries.   - FFN collected - GBS collected - BMZ dose 1 - UA   2) Fetus - cat I tracing  3) PNL - Blood type O/Positive/-- (12/09 1556) / Anti-bodyscreen Negative (12/09 1556) / Rubella 8.13 (12/09 1556) / Varicella Immune / RPR Non Reactive (05/11 0940) / HBsAg Negative (12/09 1556) / HIV Non Reactive (05/11 0940) / 1-hr OGTT 91 / GBS unknown  4)  Immunization History -  Immunization History  Administered Date(s) Administered  . Influenza,inj,Quad PF,6+ Mos 06/08/2018  . PPD Test 08/17/2014  . Tdap 02/24/2018, 02/07/2021    5) Disposition - pending recheck  Malachy Mood, MD, [place], [facility] 02/19/2021, 1:50 AM

## 2021-02-19 NOTE — Discharge Summary (Signed)
Physician Final Progress Note  Patient ID: Alexandria Orozco MRN: [id number] DOB/AGE: December 16, 1985 35 y.o.  Admit date: 02/19/2021 Admitting provider: Malachy Mood, MD Discharge date: 02/19/2021   Admission Diagnoses: Preterm contractions (Braxton-Hicks)  Discharge Diagnoses:  Active Problems:   Labor and delivery indication for care or intervention  35 y.o. [id number] at [redacted]w[redacted]d by Estimated Date of Delivery: 04/11/21 presenting with preterm contractions in the setting of prior preterm births at 47 weeks.  On admission patient was having mild but regular contractions.  Cervix was closed and remained closed on 2-hr recheck.  FFN negative and contractions spaced out with po hydration.  Patient received BMZ x 1 today with plan for repeat in 24-hrs.  GBS was collected.  The patient was given the option of recheck in AM vs discharge home and elected for discharge home.  +FM, no LOF, no VB  pregnancy5 Problems (from 07/05/20 to present)    Problem Noted Resolved   Supervision of high risk pregnancy in first trimester 08/24/2020 by Orlie Pollen, CNM No   Overview Addendum 02/07/2021  8:17 AM by Malachy Mood, MD    Clinic [facility] Prenatal Labs  Dating LMP = 100w1d Blood type: O pos  Genetic Screen NIPS: normal XX Antibody: negative  Anatomic [place] Normal Rubella: Immune Varicella: Immune  GTT  Third trimester: 91 RPR: NR  Rhogam N/A HBsAg: negative  TDaP vaccine                        Flu Shot: declines HIV: negative  Baby Food   breast                              GBS:   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, deep margins clear, 08/24/2020 NILM  CBB     CS/VBAC N/A   Support Person Husband Altamese Dilling          Previous Version   Nausea and vomiting during pregnancy 08/24/2020 by Orlie Pollen, CNM No   Headache in pregnancy, antepartum, first trimester 08/24/2020 by Orlie Pollen, CNM No      Temp:  [98.5 F (36.9 C)]  98.5 F (36.9 C) (06/06 0110) Pulse Rate:  [80] 80 (06/06 0110) Resp:  [16] 16 (06/06 0110) BP: (111)/(70) 111/70 (06/06 0110) Weight:  [72.1 kg] 72.1 kg (06/06 0109)   Consults: None  Significant Findings/ Diagnostic Studies:  Results for orders placed or performed during the hospital encounter of 02/19/21 (from the past 24 hour(s))  Fetal fibronectin     Status: None   Collection Time: 02/19/21  1:35 AM  Result Value Ref Range   Fetal Fibronectin NEGATIVE NEGATIVE  Urinalysis, Complete w Microscopic     Status: Abnormal   Collection Time: 02/19/21  1:35 AM  Result Value Ref Range   Color, Urine YELLOW (A) YELLOW   APPearance CLEAR (A) CLEAR   Specific Gravity, Urine 1.023 1.005 - 1.030   pH 6.0 5.0 - 8.0   Glucose, UA NEGATIVE NEGATIVE mg/dL   Hgb urine dipstick NEGATIVE NEGATIVE   Bilirubin Urine NEGATIVE NEGATIVE   Ketones, ur NEGATIVE NEGATIVE mg/dL   Protein, ur NEGATIVE NEGATIVE mg/dL   Nitrite NEGATIVE NEGATIVE   Leukocytes,Ua NEGATIVE NEGATIVE   RBC / HPF 0-5 0 - 5 RBC/hpf   WBC, UA 0-5 0 - 5 WBC/hpf   Bacteria, UA NONE SEEN NONE  SEEN   Squamous Epithelial / LPF 0-5 0 - 5   Mucus PRESENT      Procedures:  Baseline: 140 Variability: moderate Accelerations: present Decelerations: absent Tocometry: rare contractions noted following po hydration The patient was monitored for 30 minutes, fetal heart rate tracing was deemed reactive, category I tracing,  CPT [id number]   Discharge Condition: good  Disposition: Discharge disposition: 01-Home or Self Care       Diet: Regular diet  Discharge Activity: Activity as tolerated  Discharge Instructions    Discharge activity:  No Restrictions   Complete by: As directed    Discharge diet:  No restrictions   Complete by: As directed    No sexual activity restrictions   Complete by: As directed    Notify physician for a general feeling that "something is not right"   Complete by: As directed    Notify physician  for increase or change in vaginal discharge   Complete by: As directed    Notify physician for intestinal cramps, with or without diarrhea, sometimes described as "gas pain"   Complete by: As directed    Notify physician for leaking of fluid   Complete by: As directed    Notify physician for low, dull backache, unrelieved by heat or Tylenol   Complete by: As directed    Notify physician for menstrual like cramps   Complete by: As directed    Notify physician for pelvic pressure   Complete by: As directed    Notify physician for uterine contractions.  These may be painless and feel like the uterus is tightening or the baby is  "balling up"   Complete by: As directed    Notify physician for vaginal bleeding   Complete by: As directed    PRETERM LABOR:  Includes any of the follwing symptoms that occur between 20 - [redacted] weeks gestation.  If these symptoms are not stopped, preterm labor can result in preterm delivery, placing your baby at risk   Complete by: As directed      Allergies as of 02/19/2021      Reactions   Ampicillin Rash   Did it involve swelling of the face/tongue/throat, SOB, or low BP? No Did it involve sudden or severe rash/hives, skin peeling, or any reaction on the inside of your mouth or nose? No Did you need to seek medical attention at a hospital or doctor's office? No When did it last happen?3-4 years ago If all above answers are "NO", may proceed with cephalosporin use.   Morphine And Related Itching, Rash   Penicillins Rash   Did it involve swelling of the face/tongue/throat, SOB, or low BP? No Did it involve sudden or severe rash/hives, skin peeling, or any reaction on the inside of your mouth or nose? No Did you need to seek medical attention at a hospital or doctor's office? No When did it last happen?3-4 years ago If all above answers are "NO", may proceed with cephalosporin use.   Sulfa Antibiotics Rash      Medication List    TAKE these  medications   prenatal multivitamin Tabs tablet Take 1 tablet by mouth daily at 12 noon.        Total time spent taking care of this patient: 45 minutes  Signed: Malachy Mood 02/19/2021, 3:38 AM

## 2021-02-19 NOTE — OB Triage Note (Signed)
Discharge instructions and labor precautions reviewed with patient and husband. Patient told to come back for second dose of BMZ on 02/20/21 @ 1000. Patient verbalized understanding. All questions answered. Patient discharged home ambulatory with husband.

## 2021-02-19 NOTE — OB Triage Note (Signed)
Pt presents to unit c/o ctx every 2-3 minutes that began around 2030 tonight, pain rating 5/10. Pt reports +FM, denies LOF, vaginal bleeding, and anything in the vagina in the last 24 hours. External monitors applied and assessing. Initial FHTs 150s.

## 2021-02-20 LAB — CULTURE, BETA STREP (GROUP B ONLY)

## 2021-02-21 ENCOUNTER — Observation Stay
Admission: EM | Admit: 2021-02-21 | Discharge: 2021-02-21 | Disposition: A | Discharge status: Home / Self Care | Payer: Managed Care, Other (non HMO) | Attending: Obstetrics | Admitting: Obstetrics

## 2021-02-21 ENCOUNTER — Ambulatory Visit (INDEPENDENT_AMBULATORY_CARE_PROVIDER_SITE_OTHER): Payer: Managed Care, Other (non HMO) | Admitting: Obstetrics and Gynecology

## 2021-02-21 ENCOUNTER — Encounter: Payer: Self-pay | Admitting: Obstetrics and Gynecology

## 2021-02-21 ENCOUNTER — Other Ambulatory Visit: Payer: Self-pay

## 2021-02-21 VITALS — BP 122/70 | Ht 61.0 in | Wt 169.8 lb

## 2021-02-21 DIAGNOSIS — O4700 False labor before 37 completed weeks of gestation, unspecified trimester: Secondary | ICD-10-CM | POA: Diagnosis present

## 2021-02-21 DIAGNOSIS — Z3A33 33 weeks gestation of pregnancy: Secondary | ICD-10-CM | POA: Insufficient documentation

## 2021-02-21 DIAGNOSIS — Z8751 Personal history of pre-term labor: Secondary | ICD-10-CM | POA: Diagnosis not present

## 2021-02-21 DIAGNOSIS — O0993 Supervision of high risk pregnancy, unspecified, third trimester: Secondary | ICD-10-CM

## 2021-02-21 DIAGNOSIS — O09213 Supervision of pregnancy with history of pre-term labor, third trimester: Secondary | ICD-10-CM

## 2021-02-21 DIAGNOSIS — O0991 Supervision of high risk pregnancy, unspecified, first trimester: Secondary | ICD-10-CM

## 2021-02-21 DIAGNOSIS — Z298 Encounter for other specified prophylactic measures: Secondary | ICD-10-CM | POA: Diagnosis not present

## 2021-02-21 LAB — POCT URINALYSIS DIPSTICK OB
Glucose, UA: NEGATIVE
POC,PROTEIN,UA: NEGATIVE

## 2021-02-21 MED ORDER — BETAMETHASONE SOD PHOS & ACET 6 (3-3) MG/ML IJ SUSP
INTRAMUSCULAR | Status: AC
  Administered 2021-02-21: 12 mg
  Filled 2021-02-21: qty 5

## 2021-02-21 MED ORDER — BETAMETHASONE SOD PHOS & ACET 6 (3-3) MG/ML IJ SUSP: 12.0000 mg | Freq: Once | INTRAMUSCULAR | Status: AC

## 2021-02-21 NOTE — Progress Notes (Signed)
Pt here for 2nd dose of betamethasone. 12mg  IM betamethasone given in the left glute. Pt tolerated well. Pt to be discharged hoe.

## 2021-02-21 NOTE — Discharge Summary (Signed)
Pease see Final Progress note.  Imagene Riches, CNM  02/21/2021 9:23 AM

## 2021-02-21 NOTE — Addendum Note (Signed)
Addended by: Vernia Buff A on: 02/21/2021 12:01 PM   Modules accepted: Orders

## 2021-02-21 NOTE — Progress Notes (Signed)
Routine Prenatal Care Visit  Subjective  Alexandria Orozco is a 35 y.o. [phone] at [redacted]w[redacted]d being seen today for ongoing prenatal care.  She is currently monitored for the following issues for this high-risk pregnancy and has Carcinoma in situ of exocervix; S/P conization of cervix; Supervision of high risk pregnancy in first trimester; Nausea and vomiting during pregnancy; Headache in pregnancy, antepartum, first trimester; History of preterm delivery, currently pregnant; Labor and delivery indication for care or intervention; Preterm uterine contractions in third trimester, antepartum; and History of preterm delivery, currently pregnant in third trimester on their problem list.  ----------------------------------------------------------------------------------- Patient reports she has continue to have contractions throughout the day, they are mild to moderate. She did not return tot he hospital on 6/7 for her second betamethasone dose. She was under the impression the could receive betamethasone today.  Contractions: Irritability. Vag. Bleeding: None.  Movement: Present. Denies leaking of fluid.  ----------------------------------------------------------------------------------- The following portions of the patient's history were reviewed and updated as appropriate: allergies, current medications, past family history, past medical history, past social history, past surgical history and problem list. Problem list updated.   Objective  Blood pressure 122/70, height 5\' 1"  (1.549 m), weight 169 lb 12.8 oz (77 kg), last menstrual period 07/05/2020, unknown if currently breastfeeding. Pregravid weight 148 lb (67.1 kg) Total Weight Gain 21 lb 12.8 oz (9.888 kg) Urinalysis:      Fetal Status: Fetal Heart Rate (bpm): 135 Fundal Height: 33 cm Movement: Present     General:  Alert, oriented and cooperative. Patient is in no acute distress.  Skin: Skin is warm and dry. No rash noted.   Cardiovascular:  Normal heart rate noted  Respiratory: Normal respiratory effort, no problems with respiration noted  Abdomen: Soft, gravid, appropriate for gestational age. Pain/Pressure: Present     Pelvic:  Cervical exam deferred        Extremities: Normal range of motion.  Edema: None  Mental Status: Normal mood and affect. Normal behavior. Normal judgment and thought content.     Assessment   35 y.o. [id number] at [redacted]w[redacted]d by  04/11/2021, by Last Menstrual Period presenting for routine prenatal visit  Plan   pregnancy5 Problems (from 07/05/20 to present)    Problem Noted Resolved   Supervision of high risk pregnancy in first trimester 08/24/2020 by Orlie Pollen, CNM No   Overview Addendum 02/21/2021  8:33 AM by Homero Fellers, MD    Clinic [facility] Prenatal Labs  Dating LMP = [redacted]w[redacted]d Blood type: O pos  Genetic Screen NIPS: normal XX Antibody: negative  Anatomic [place] Normal Rubella: Immune Varicella: Immune  GTT  Third trimester: 91 RPR: NR  Rhogam N/A HBsAg: negative  TDaP vaccine                        Flu Shot: declines HIV: negative  Baby Food   breast                              GBS: POSITIVE  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 She reported needing to go to work.   Gestational age appropriate obstetric precautions including but not limited to vaginal bleeding, contractions, leaking of fluid and fetal movement were reviewed in detail with the patient.    Return in about 2 weeks (around 03/07/2021) for ROB in person.  Homero Fellers MD [facility], [facility] 02/21/2021, 8:36 AM

## 2021-02-21 NOTE — Patient Instructions (Signed)

## 2021-02-21 NOTE — Final Progress Note (Signed)
Clotee Schlicker is a 35 year old Gravida 5 para 3, now [redacted] weeks gestation, who is sent from the [facility] OB office fro a second dose of Betamethasone. She was seen at the hospital two days ago with some indications of preterm contractions. Due to a hx of PTL, as well as abnormal pap smears that render her at higher risk for cervical insufficiency, she  Was advised to receive two doses of BMZ.  Today she arrives  Simply for her BMZ. She denies any contractions today, and her baby is moving well. She was just seen within the hour at [facility] for a routine OB appointment, and reports that all is going well. The fetal heart tones are reassuring; she denies any LOF, vaginal bleeding and the baby is moving well.  LMP 07/05/2020  Objective:  See the VS from [facility] - Blood pressure: 122/70. weight 169 lbs Total weight gain this pregnancy is 12.8 lbs Fetal heart tones: 130s Fundal height 33 cms  Clinic  Prenatal Labs  Dating  Blood type: O/Positive/-- (12/09 1556)   Genetic Screen 1 Screen:    AFP:     Quad:     NIPS: Antibody:Negative (12/09 1556)  Anatomic [place]  Rubella: 8.13 (12/09 1556)  GTT Early:               Third trimester:  RPR: Non Reactive (05/11 0940)   Flu vaccine  HBsAg: Negative (12/09 1556)   TDaP vaccine                                               Rhogam: HIV: Non Reactive (05/11 0940)   Baby Food                                               GBS: (For PCN allergy, check sensitivities)  Contraception  Pap:  Circumcision    Pediatrician    Support Person        A: Gravida 5 para 3 with Hx of PTL for second dose of BMZ P:  Given IM injection of 12.5 mg Betamethasone      Discharged home to self care with plans for follow up in one week at [place].      Preterm labor precautions.  Imagene Riches, CNM  02/21/2021 9:35 AM

## 2021-02-21 NOTE — Progress Notes (Signed)
Pt discharged home after 2nd dose of betamethasone injection.  Pt stable and ambulatory. An After Visit Summary was printed and given to the patient. Discharge education completed with patient/family including follow up instructions, medication list, d/c activities limitations if indicated, with other d/c instructions as indicated by CNM . Pt received labor and bleeding precautions. Patient able to verbalize understanding, all questions fully answered. Patient instructed to return to ED, call 911, or call MD for any changes in condition. Pt discharged home via personal vehicle.

## 2021-02-27 ENCOUNTER — Encounter: Payer: Self-pay | Admitting: Obstetrics and Gynecology

## 2021-02-27 ENCOUNTER — Inpatient Hospital Stay
Admission: EM | Admit: 2021-02-27 | Discharge: 2021-02-28 | Disposition: A | Discharge status: Home / Self Care | Payer: Managed Care, Other (non HMO) | Attending: Obstetrics and Gynecology | Admitting: Obstetrics and Gynecology

## 2021-02-27 DIAGNOSIS — O09213 Supervision of pregnancy with history of pre-term labor, third trimester: Secondary | ICD-10-CM

## 2021-02-27 DIAGNOSIS — O0991 Supervision of high risk pregnancy, unspecified, first trimester: Secondary | ICD-10-CM

## 2021-02-27 DIAGNOSIS — O26891 Other specified pregnancy related conditions, first trimester: Secondary | ICD-10-CM

## 2021-02-27 DIAGNOSIS — O99820 Streptococcus B carrier state complicating pregnancy: Secondary | ICD-10-CM | POA: Diagnosis not present

## 2021-02-27 DIAGNOSIS — R1030 Lower abdominal pain, unspecified: Secondary | ICD-10-CM | POA: Diagnosis not present

## 2021-02-27 DIAGNOSIS — Z3A39 39 weeks gestation of pregnancy: Secondary | ICD-10-CM

## 2021-02-27 DIAGNOSIS — O219 Vomiting of pregnancy, unspecified: Secondary | ICD-10-CM

## 2021-02-27 DIAGNOSIS — Z3A33 33 weeks gestation of pregnancy: Secondary | ICD-10-CM | POA: Insufficient documentation

## 2021-02-27 DIAGNOSIS — O26893 Other specified pregnancy related conditions, third trimester: Secondary | ICD-10-CM

## 2021-02-27 LAB — COMPREHENSIVE METABOLIC PANEL
ALT: 13 U/L (ref 0–44)
AST: 17 U/L (ref 15–41)
Albumin: 2.8 g/dL — ABNORMAL LOW (ref 3.5–5.0)
Alkaline Phosphatase: 128 U/L — ABNORMAL HIGH (ref 38–126)
Anion gap: 8 (ref 5–15)
BUN: 10 mg/dL (ref 6–20)
CO2: 20 mmol/L — ABNORMAL LOW (ref 22–32)
Calcium: 8.3 mg/dL — ABNORMAL LOW (ref 8.9–10.3)
Chloride: 104 mmol/L (ref 98–111)
Creatinine, Ser: 0.53 mg/dL (ref 0.44–1.00)
GFR, Estimated: >60 mL/min (ref >60)
Glucose, Bld: 84 mg/dL (ref 70–99)
Potassium: 3.7 mmol/L (ref 3.5–5.1)
Sodium: 132 mmol/L — ABNORMAL LOW (ref 135–145)
Total Bilirubin: 0.6 mg/dL (ref 0.3–1.2)
Total Protein: 6.9 g/dL (ref 6.5–8.1)

## 2021-02-27 LAB — CBC
HCT: 32.6 % — ABNORMAL LOW (ref 36.0–46.0)
Hemoglobin: 11.1 g/dL — ABNORMAL LOW (ref 12.0–15.0)
MCH: 30.8 pg (ref 26.0–34.0)
MCHC: 34 g/dL (ref 30.0–36.0)
MCV: 90.6 fL (ref 80.0–100.0)
Platelets: 320 10*3/uL (ref 150–400)
RBC: 3.6 MIL/uL — ABNORMAL LOW (ref 3.87–5.11)
RDW: 12.3 % (ref 11.5–15.5)
WBC: 13.4 10*3/uL — ABNORMAL HIGH (ref 4.0–10.5)
nRBC: 0 % (ref 0.0–0.2)

## 2021-02-27 LAB — URINALYSIS, ROUTINE W REFLEX MICROSCOPIC
Bilirubin Urine: NEGATIVE
Glucose, UA: NEGATIVE mg/dL
Hgb urine dipstick: NEGATIVE
Ketones, ur: 5 mg/dL — AB
Leukocytes,Ua: NEGATIVE
Nitrite: NEGATIVE
Protein, ur: NEGATIVE mg/dL
Specific Gravity, Urine: 1.024 (ref 1.005–1.030)
pH: 7 (ref 5.0–8.0)

## 2021-02-27 LAB — CHLAMYDIA/NGC RT PCR (ARMC ONLY)
Chlamydia Tr: NOT DETECTED
N gonorrhoeae: NOT DETECTED

## 2021-02-27 LAB — FETAL FIBRONECTIN: Fetal Fibronectin: POSITIVE — AB

## 2021-02-27 LAB — GROUP B STREP BY PCR: Group B strep by PCR: POSITIVE — AB

## 2021-02-27 MED ORDER — DEXTROSE-NACL 5-0.45 % IV SOLN: INTRAVENOUS | Status: DC

## 2021-02-27 MED ORDER — ACETAMINOPHEN 500 MG PO TABS
1000.0000 mg | ORAL_TABLET | Freq: Once | ORAL | Status: AC
  Administered 2021-02-27: 1000 mg via ORAL
  Filled 2021-02-27: qty 2

## 2021-02-27 MED ORDER — LACTATED RINGERS IV BOLUS
1000.0000 mL | INTRAVENOUS | Status: AC
  Administered 2021-02-27: 1000 mL via INTRAVENOUS

## 2021-02-27 NOTE — OB Triage Note (Signed)
Pt presents for ctx every 2-3 minutes that started earlier today and have got closer together and stronger. Pt reports pain 5/10 in abd and rectal pressure. Hx 2 preterm deliveries. Pt has received BMZ.   Dr. Glennon Mac notified of triage. Notified of fetal heart baseline. Orders for G/C, GBS, FFN and SVE and oral hydration.

## 2021-02-27 NOTE — H&P (Signed)
OB History & Physical   History of Present Illness:  Chief Complaint: contractions  HPI:  Alexandria Orozco is a 35 y.o. [phone] female at [redacted]w[redacted]d dated by LMP consistent with a 7 week ultrasound.  Her pregnancy has been complicated by history of preterm delivery x 2.  G1 resulted in a 35 week delivery. G2 resulted in a 39 week delivery, G3 was another 35 week delivery .    She reports contractions more or less consistently for the past few days. However, the symptoms became worse today at about noon.  She has not timed the contractions, though they have become stronger.   She denies leakage of fluid.   She denies vaginal bleeding.   She reports fetal movement.    She was given BMTZ 1 week ago for a +fFN. She is GBS positive.   She denies nausea, vomiting, diarrhea. She also denies urinary symptoms of dysuria, increased frequency over her baseline, abnormal smell to her urine.  She denies vaginal irritative symptoms of itching, burning, irritation, abnormal discharge, and abnormal odor to her discharge.    Total weight gain for pregnancy: 8.618 kg   Obstetrical Problem List: pregnancy5 Problems (from 07/05/20 to present)     Problem Noted Resolved   Supervision of high risk pregnancy in first trimester 08/24/2020 by Orlie Pollen, CNM No   Overview Addendum 02/21/2021  8:40 AM by Homero Fellers, MD    Clinic [facility] Prenatal Labs  Dating LMP = [redacted]w[redacted]d Blood type: O pos  Genetic Screen NIPS: normal XX Antibody: negative  Anatomic [place] Normal Rubella: Immune Varicella: Immune  GTT  Third trimester: 91 RPR: NR  Rhogam N/A HBsAg: negative  TDaP vaccine                        Flu Shot: declines HIV: negative  Baby Food   breast                              GBS: POSITIVE  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, deep margins clear, 08/24/2020 NILM  CBB     CS/VBAC N/A   Support Person Husband Altamese Dilling    Hx PTL, single  dose Betamethasone 02/19/2021       Nausea and vomiting during pregnancy 08/24/2020 by Orlie Pollen, CNM No   Headache in pregnancy, antepartum, first trimester 08/24/2020 by Orlie Pollen, CNM No        Maternal Medical History:   Past Medical History:  Diagnosis Date   Anxiety    Cancer ([place])    GERD (gastroesophageal reflux disease)    WITH PREGNANCY ONLY   History of methicillin resistant staphylococcus aureus (MRSA) 2018   Migraines    MIGRAINES   S/P conization of cervix 11/23/2018   S/P LEEP 12/14/2018    Past Surgical History:  Procedure Laterality Date   CERVICAL CONIZATION W/BX N/A 11/23/2018   Procedure: COLD KNIFE CONIZATION CERVIX WITH BIOPSY;  Surgeon: Rubie Maid, MD;  Location: [facility] ORS;  Service: Gynecology;  Laterality: N/A;   CHOLECYSTECTOMY     ESOPHAGOGASTRODUODENOSCOPY (EGD) WITH PROPOFOL N/A 08/06/2017   Procedure: ESOPHAGOGASTRODUODENOSCOPY (EGD) WITH PROPOFOL;  Surgeon: Lucilla Lame, MD;  Location: [place];  Service: Endoscopy;  Laterality: N/A;   TONSILLECTOMY      Allergies  Allergen Reactions   Ampicillin Rash    Did it  involve swelling of the face/tongue/throat, SOB, or low BP? No Did it involve sudden or severe rash/hives, skin peeling, or any reaction on the inside of your mouth or nose? No Did you need to seek medical attention at a hospital or doctor's office? No When did it last happen?      3-4 years ago If all above answers are "NO", may proceed with cephalosporin use.   Morphine And Related Itching and Rash   Penicillins Rash    Did it involve swelling of the face/tongue/throat, SOB, or low BP? No Did it involve sudden or severe rash/hives, skin peeling, or any reaction on the inside of your mouth or nose? No Did you need to seek medical attention at a hospital or doctor's office? No When did it last happen?      3-4 years ago If all above answers are "NO", may proceed with cephalosporin use.    Sulfa Antibiotics Rash     Prior to Admission medications   Medication Sig Start Date End Date Taking? Authorizing Provider  Prenatal Vit-Fe Fumarate-FA (PRENATAL MULTIVITAMIN) TABS tablet Take 1 tablet by mouth daily at 12 noon.   Yes [provider]    OB History  Gravida Para Term Preterm AB Living  5 3 1 2 1 3   SAB IAB Ectopic Multiple Live Births  1     0 3    # Outcome Date GA Lbr Len/2nd Weight Sex Delivery Anes PTL Lv  5 Current           4 SAB 04/06/19          3 Preterm 04/06/18 [redacted]w[redacted]d / 00:05 2940 g M Vag-Spont EPI  LIV  2 Term 08/08/16   2977 g F Vag-Spont EPI N LIV  1 Preterm 07/10/07   2523 g M Vag-Spont EPI Y LIV    Prenatal care site: [facility]  Social History: She  reports that she has never smoked. She has never used smokeless tobacco. She reports that she does not drink alcohol and does not use drugs.  Family History: family history includes Diabetes in her maternal grandmother, mother, and paternal uncle; Heart attack in her father; Pancreatitis in her maternal grandmother.   Review of Systems:  Review of Systems  Constitutional: Negative.   HENT: Negative.    Eyes: Negative.   Respiratory: Negative.    Cardiovascular: Negative.   Gastrointestinal:  Positive for abdominal pain (contractions). Negative for blood in stool, constipation, diarrhea, heartburn, melena, nausea and vomiting.  Genitourinary: Negative.   Musculoskeletal: Negative.   Skin: Negative.   Neurological: Negative.   Psychiatric/Behavioral: Negative.      Physical Exam:  BP 121/73 (BP Location: Left Arm)   Pulse (!) 101   Temp 99.5 F (37.5 C) (Oral)   Resp 17   Ht 5\' 1"  (1.549 m)   Wt 75.8 kg   LMP 07/05/2020   BMI 31.55 kg/m   Physical Exam Constitutional:      General: She is not in acute distress.    Appearance: Normal appearance. She is well-developed.  Genitourinary:     Genitourinary Comments: 1cm (per RN)  HENT:     Head: Normocephalic and atraumatic.  Eyes:     General:  No scleral icterus.    Conjunctiva/sclera: Conjunctivae normal.  Cardiovascular:     Rate and Rhythm: Normal rate and regular rhythm.     Heart sounds: No murmur heard.   No friction rub. No gallop.  Pulmonary:  Effort: Pulmonary effort is normal. No respiratory distress.     Breath sounds: Normal breath sounds. No wheezing or rales.  Abdominal:     General: Bowel sounds are normal. There is no distension.     Palpations: Abdomen is soft. There is mass (gravid, NT).     Tenderness: There is no abdominal tenderness. There is no guarding or rebound.  Musculoskeletal:        General: Normal range of motion.     Cervical back: Normal range of motion and neck supple.  Neurological:     General: No focal deficit present.     Mental Status: She is alert and oriented to person, place, and time.     Cranial Nerves: No cranial nerve deficit.  Skin:    General: Skin is warm and dry.     Findings: No erythema.  Psychiatric:        Mood and Affect: Mood normal.        Behavior: Behavior normal.        Judgment: Judgment normal.     Baseline FHR: 155 beats/min   Variability: moderate   Accelerations: present   Decelerations: absent Contractions: present frequency: 2-3 q 10 min Overall assessment: cat 1  (Initial FHR basline 180 bpm, this gradually came down spontaneously)  Bedside Ultrasound:  Number of Fetus: 1  Presentation: hand (head just behind hand. There is some fluid between hand and cervix)  Fluid: subjectively normal  Placental Location: posterior/fundal  Lab Results  Component Value Date   SARSCOV2NAA NOT DETECTED 04/15/2019    Assessment:  Alexandria Orozco is a 35 y.o. [phone] female at [redacted]w[redacted]d with preterm contractions, history of preterm delivery.  She may also be having preterm labor. Will  continue to monitor.   Plan:  Admit to Labor & Delivery  CBC, T&S, Clrs, IVF GBS positive: if labor continues, will give cephalosporin Fetwal well-being: reassuring overall,  especially with normalization of FHR baseline. Patient is status post BMTZ. So, will not try to stop labor.  Will monitor fetal presentation should she actually labor.  All discussed with patient.    Prentice Docker, MD 02/27/2021 8:55 PM

## 2021-02-28 DIAGNOSIS — Z3A34 34 weeks gestation of pregnancy: Secondary | ICD-10-CM

## 2021-02-28 DIAGNOSIS — O26893 Other specified pregnancy related conditions, third trimester: Secondary | ICD-10-CM | POA: Diagnosis not present

## 2021-02-28 DIAGNOSIS — O09213 Supervision of pregnancy with history of pre-term labor, third trimester: Secondary | ICD-10-CM | POA: Diagnosis not present

## 2021-02-28 DIAGNOSIS — R1030 Lower abdominal pain, unspecified: Secondary | ICD-10-CM

## 2021-02-28 NOTE — Progress Notes (Signed)
Patient now unchanged x 3 checks.  Contractions have spaced out. She notes she is still hurting, but she is better than prior.  She elects further monitoring. Will discharge after next check, if unchanged.    Prentice Docker, MD, [facility], [facility] 02/28/2021 5:37 AM

## 2021-02-28 NOTE — Discharge Summary (Signed)
RN reviewed discharge instructions with pt and her husband. Gave opportunity for questions. All questions answered at this time. Pt verbalized understanding. Pt discharged home with her husband.

## 2021-02-28 NOTE — Progress Notes (Signed)
[facility] [place] [id number] [place] [address] Phone: [phone] Fax: [phone]  February 28, 2021  Patient: Alexandria Orozco  Date of Birth: November 07, 1985  Date of Visit: 02/27/2021    To Whom It May Concern:  Jovani Colquhoun was seen and treated in our Labor and [facility] on 02/27/2021 through 02/28/2021.  LEOTTA WEINGARTEN  may return to work on 03/01/2021 .  Sincerely,  Barnett Applebaum, MD [facility]

## 2021-02-28 NOTE — Discharge Summary (Signed)
Physician Discharge Summary  Patient ID: Alexandria Orozco MRN: [id number] DOB/AGE: 10-09-1985 35 y.o.  Admit date: 02/27/2021 Discharge date: 02/28/2021  Admission Diagnoses:Lower abdominal pain in pregnancy, history of preterm labor, 34 weeks   Discharge Diagnoses: same  Discharged Condition: good  Hospital Course: Pt seen and examined, monitored for sign of labor progression.  She has prior PTD x2.  She had sex night prior of coming to hospital.  No recent trauma.  Monitoring of fetus reassuring.  No cervical change and presentation high (ballottable).  fFN Pos but may be false pos due to sex.  Pt feels rested.  Re-exam- confirms no change in cervix.  A NST procedure was performed with FHR monitoring and a normal baseline established, appropriate time of 20-40 minutes of evaluation, and accels >2 seen w 15x15 characteristics.  Results show a REACTIVE NST.   Note for work for today, encouraged minimal exertional activity for the next 2 weeks.  Pt still wants to work.  Consults: None  Significant Diagnostic Studies: fFN Pos  Treatments: none  Discharge Exam: Blood pressure 121/73, pulse (!) 101, temperature 98.9 F (37.2 C), temperature source Oral, resp. rate 17, height 5\' 1"  (1.549 m), weight 75.8 kg, last menstrual period 07/05/2020, unknown if currently breastfeeding. General appearance: alert, cooperative, and no distress GI: gravid NT, ND, FHT 150s Pelvic: external genitalia normal, no adnexal masses or tenderness, vagina normal without discharge, and Cx 1/40/ballottable Extremities: extremities normal, atraumatic, no cyanosis or edema  Disposition: Discharge disposition: 01-Home or Self Care       Discharge Instructions     Call MD for:   Complete by: As directed    Worsening contractions or pain; leakage of fluid; bleeding.   Diet - low sodium heart healthy   Complete by: As directed    Increase activity slowly   Complete by: As directed       Allergies as  of 02/28/2021       Reactions   Ampicillin Rash   Did it involve swelling of the face/tongue/throat, SOB, or low BP? No Did it involve sudden or severe rash/hives, skin peeling, or any reaction on the inside of your mouth or nose? No Did you need to seek medical attention at a hospital or doctor's office? No When did it last happen?      3-4 years ago If all above answers are "NO", may proceed with cephalosporin use.   Morphine And Related Itching, Rash   Penicillins Rash   Did it involve swelling of the face/tongue/throat, SOB, or low BP? No Did it involve sudden or severe rash/hives, skin peeling, or any reaction on the inside of your mouth or nose? No Did you need to seek medical attention at a hospital or doctor's office? No When did it last happen?      3-4 years ago If all above answers are "NO", may proceed with cephalosporin use.   Sulfa Antibiotics Rash        Medication List     TAKE these medications    prenatal multivitamin Tabs tablet Take 1 tablet by mouth daily at 12 noon.         Signed: Hoyt Koch 02/28/2021, 7:33 AM

## 2021-03-01 NOTE — Telephone Encounter (Signed)
Pt seen at ED 

## 2021-03-06 ENCOUNTER — Encounter: Payer: Self-pay | Admitting: Obstetrics and Gynecology

## 2021-03-06 ENCOUNTER — Other Ambulatory Visit: Payer: Self-pay

## 2021-03-06 ENCOUNTER — Observation Stay
Admission: EM | Admit: 2021-03-06 | Discharge: 2021-03-06 | Disposition: A | Discharge status: Home / Self Care | Payer: Managed Care, Other (non HMO) | Attending: Obstetrics and Gynecology | Admitting: Obstetrics and Gynecology

## 2021-03-06 DIAGNOSIS — O26891 Other specified pregnancy related conditions, first trimester: Secondary | ICD-10-CM

## 2021-03-06 DIAGNOSIS — Z859 Personal history of malignant neoplasm, unspecified: Secondary | ICD-10-CM | POA: Diagnosis not present

## 2021-03-06 DIAGNOSIS — O0991 Supervision of high risk pregnancy, unspecified, first trimester: Secondary | ICD-10-CM

## 2021-03-06 DIAGNOSIS — O0993 Supervision of high risk pregnancy, unspecified, third trimester: Secondary | ICD-10-CM | POA: Diagnosis not present

## 2021-03-06 DIAGNOSIS — O4703 False labor before 37 completed weeks of gestation, third trimester: Secondary | ICD-10-CM | POA: Diagnosis present

## 2021-03-06 DIAGNOSIS — O219 Vomiting of pregnancy, unspecified: Secondary | ICD-10-CM

## 2021-03-06 DIAGNOSIS — Z3A34 34 weeks gestation of pregnancy: Secondary | ICD-10-CM

## 2021-03-06 NOTE — OB Triage Note (Signed)
Pt is a 35y/o G5P3 at [redacted]w[redacted]d with c/o ctx every 2-59minues that got worse in intesity today rating 7/10 pelvic pain. Pt states +FM. Pt denies LOF and VB. Monitors applied and assessing. Initial FHT 145.

## 2021-03-06 NOTE — Telephone Encounter (Signed)
Spoke with patient via phone. Advised per CRS to return to L&D for monitoring. Patient stated that she would monitor her symptoms and if worsening after lunch she would report to L&D as she is scheduled to see CRS in office tomorrow (03/07/21).

## 2021-03-06 NOTE — Discharge Summary (Signed)
Physician Final Progress Note  Patient ID: Alexandria Orozco MRN: [id number] DOB/AGE: July 28, 1986 35 y.o.  Admit date: 03/06/2021 Admitting provider: Rod Can, CNM Discharge date: 03/06/2021   Admission Diagnoses: contractions  Discharge Diagnoses:  Active Problems:   Supervision of high risk pregnancy in third trimester   Preterm uterine contractions in third trimester, antepartum   Labor and delivery, indication for care   [redacted] weeks gestation of pregnancy    History of Present Illness: The patient is a 35 y.o. female [phone] at [redacted]w[redacted]d who presents for contractions that became more regular in the past day. She reports some mucous discharge. She reports good fetal movement. She denies vaginal bleeding. She had 2 doses of betamethasone 2 weeks ago due to threatened preterm labor and she has a history of 2 preterm deliveries. She was seen in triage 1 week ago and was 1/40/ballotable. She admits good hydration.  Tonight she is admitted for observation, placed on monitors, cervical exam. Contractions were every 2-5 minutes when she arrived and by the time of discharge had spaced to every 4-10 minutes lasting 30 seconds. Her cervix has not changed since last week's exam. She is reassured that she is not in labor currently and given instructions and precautions. She does express a desire to deliver soon as she is "tired of being in pain."  Past Medical History:  Diagnosis Date   Anxiety    Cancer ([place])    GERD (gastroesophageal reflux disease)    WITH PREGNANCY ONLY   History of methicillin resistant staphylococcus aureus (MRSA) 2018   Migraines    MIGRAINES   S/P conization of cervix 11/23/2018   S/P LEEP 12/14/2018    Past Surgical History:  Procedure Laterality Date   CERVICAL CONIZATION W/BX N/A 11/23/2018   Procedure: COLD KNIFE CONIZATION CERVIX WITH BIOPSY;  Surgeon: Rubie Maid, MD;  Location: [facility];  Service: Gynecology;  Laterality: N/A;   CHOLECYSTECTOMY      ESOPHAGOGASTRODUODENOSCOPY (EGD) WITH PROPOFOL N/A 08/06/2017   Procedure: ESOPHAGOGASTRODUODENOSCOPY (EGD) WITH PROPOFOL;  Surgeon: Lucilla Lame, MD;  Location: [place];  Service: Endoscopy;  Laterality: N/A;   TONSILLECTOMY      No current facility-administered medications on file prior to encounter.   Current Outpatient Medications on File Prior to Encounter  Medication Sig Dispense Refill   Prenatal Vit-Fe Fumarate-FA (PRENATAL MULTIVITAMIN) TABS tablet Take 1 tablet by mouth daily at 12 noon.      Allergies  Allergen Reactions   Ampicillin Rash    Did it involve swelling of the face/tongue/throat, SOB, or low BP? No Did it involve sudden or severe rash/hives, skin peeling, or any reaction on the inside of your mouth or nose? No Did you need to seek medical attention at a hospital or doctor's office? No When did it last happen?      3-4 years ago If all above answers are "NO", may proceed with cephalosporin use.   Morphine And Related Itching and Rash   Penicillins Rash    Did it involve swelling of the face/tongue/throat, SOB, or low BP? No Did it involve sudden or severe rash/hives, skin peeling, or any reaction on the inside of your mouth or nose? No Did you need to seek medical attention at a hospital or doctor's office? No When did it last happen?      3-4 years ago If all above answers are "NO", may proceed with cephalosporin use.    Sulfa Antibiotics Rash    Social History  Socioeconomic History   Marital status: Married    Spouse name: Not on file   Number of children: Not on file   Years of education: Not on file   Highest education level: Not on file  Occupational History   Not on file  Tobacco Use   Smoking status: Never   Smokeless tobacco: Never  Vaping Use   Vaping Use: Never used  Substance and Sexual Activity   Alcohol use: No   Drug use: No   Sexual activity: Yes    Birth control/protection: None    Comment: undecided  Other Topics  Concern   Not on file  Social History Narrative   Lives at home, independent   Social Determinants of Health   Financial Resource Strain: Not on file  Food Insecurity: Not on file  Transportation Needs: Not on file  Physical Activity: Not on file  Stress: Not on file  Social Connections: Not on file  Intimate Partner Violence: Not on file    Family History  Problem Relation Age of Onset   Diabetes Mother    Heart attack Father    Diabetes Paternal Uncle    Diabetes Maternal Grandmother    Pancreatitis Maternal Grandmother    Breast cancer Neg Hx    Ovarian cancer Neg Hx    Colon cancer Neg Hx      Review of Systems  Constitutional:  Negative for chills and fever.  HENT:  Negative for congestion, ear discharge, ear pain, hearing loss, sinus pain and sore throat.   Eyes:  Negative for blurred vision and double vision.  Respiratory:  Negative for cough, shortness of breath and wheezing.   Cardiovascular:  Negative for chest pain, palpitations and leg swelling.  Gastrointestinal:  Positive for abdominal pain. Negative for blood in stool, constipation, diarrhea, heartburn, melena, nausea and vomiting.  Genitourinary:  Negative for dysuria, flank pain, frequency, hematuria and urgency.  Musculoskeletal:  Negative for back pain, joint pain and myalgias.  Skin:  Negative for itching and rash.  Neurological:  Negative for dizziness, tingling, tremors, sensory change, speech change, focal weakness, seizures, loss of consciousness, weakness and headaches.  Endo/Heme/Allergies:  Negative for environmental allergies. Does not bruise/bleed easily.  Psychiatric/Behavioral:  Negative for depression, hallucinations, memory loss, substance abuse and suicidal ideas. The patient is not nervous/anxious and does not have insomnia.     Physical Exam: BP 107/69 (BP Location: Right Arm)   Pulse 81   Temp 98.5 F (36.9 C) (Oral)   Resp 18   Ht 5\' 1"  (1.549 m)   Wt 76.2 kg   LMP 07/05/2020    BMI 31.74 kg/m   Constitutional: Well nourished, well developed female in no acute distress.  HEENT: normal Skin: Warm and dry.  Cardiovascular: Regular rate and rhythm.   Extremity:  no edema   Respiratory: Clear to auscultation bilateral. Normal respiratory effort Abdomen: FHT present Back: no CVAT Neuro: DTRs 2+, Cranial nerves grossly intact Psych: Alert and Oriented x3. No memory deficits. Normal mood and affect.  MS: normal gait, normal bilateral lower extremity ROM/strength/stability.  Pelvic exam: (female chaperone present) is not limited by body habitus EGBUS: within normal limits Vagina: within normal limits and with normal mucosa blood in the vault Cervix: 1/thick/ballotable  Toco: every 4-10 minutes lasting 30 seconds Fetal well being: 130 bpm, moderate variability, +accelerations, -decelerations   Consults: None  Significant Findings/ Diagnostic Studies: none  Procedures: NST  Hospital Course: The patient was admitted to Labor and Delivery Triage  for observation.   Discharge Condition: good  Disposition: Discharge disposition: 01-Home or Self Care  Diet: Regular diet  Discharge Activity: Activity as tolerated  Discharge Instructions     Discharge activity:  No Restrictions   Complete by: As directed    Discharge diet:  No restrictions   Complete by: As directed    Fetal Kick Count:  Lie on our left side for one hour after a meal, and count the number of times your baby kicks.  If it is less than 5 times, get up, move around and drink some juice.  Repeat the test 30 minutes later.  If it is still less than 5 kicks in an hour, notify your doctor.   Complete by: As directed    Notify physician for a general feeling that "something is not right"   Complete by: As directed    Notify physician for increase or change in vaginal discharge   Complete by: As directed    Notify physician for intestinal cramps, with or without diarrhea, sometimes described as "gas  pain"   Complete by: As directed    Notify physician for leaking of fluid   Complete by: As directed    Notify physician for low, dull backache, unrelieved by heat or Tylenol   Complete by: As directed    Notify physician for menstrual like cramps   Complete by: As directed    Notify physician for pelvic pressure   Complete by: As directed    Notify physician for uterine contractions.  These may be painless and feel like the uterus is tightening or the baby is  "balling up"   Complete by: As directed    Notify physician for vaginal bleeding   Complete by: As directed    PRETERM LABOR:  Includes any of the follwing symptoms that occur between 20 - [redacted] weeks gestation.  If these symptoms are not stopped, preterm labor can result in preterm delivery, placing your baby at risk   Complete by: As directed       Allergies as of 03/06/2021       Reactions   Ampicillin Rash   Did it involve swelling of the face/tongue/throat, SOB, or low BP? No Did it involve sudden or severe rash/hives, skin peeling, or any reaction on the inside of your mouth or nose? No Did you need to seek medical attention at a hospital or doctor's office? No When did it last happen?      3-4 years ago If all above answers are "NO", may proceed with cephalosporin use.   Morphine And Related Itching, Rash   Penicillins Rash   Did it involve swelling of the face/tongue/throat, SOB, or low BP? No Did it involve sudden or severe rash/hives, skin peeling, or any reaction on the inside of your mouth or nose? No Did you need to seek medical attention at a hospital or doctor's office? No When did it last happen?      3-4 years ago If all above answers are "NO", may proceed with cephalosporin use.   Sulfa Antibiotics Rash        Medication List     TAKE these medications    prenatal multivitamin Tabs tablet Take 1 tablet by mouth daily at 12 noon.        [place]. Go to.    Specialty: Obstetrics and Gynecology Why: scheduled prenatal appointment Contact information: [address] [place] [address] [phone]  Total time spent taking care of this patient: 15 minutes  Signed: Rod Can, CNM  03/06/2021, 2:14 AM

## 2021-03-06 NOTE — Progress Notes (Signed)
Pt determined to not be in labor. Pt to be d/c home and to self care. Pt given teaching on when to seek medical attention (LOF, VB and decreased FM, etc..). Pt verbalized understanding of d/c instructions.

## 2021-03-07 ENCOUNTER — Encounter: Payer: Self-pay | Admitting: Obstetrics and Gynecology

## 2021-03-07 ENCOUNTER — Ambulatory Visit (INDEPENDENT_AMBULATORY_CARE_PROVIDER_SITE_OTHER): Payer: Managed Care, Other (non HMO) | Admitting: Obstetrics and Gynecology

## 2021-03-07 VITALS — BP 115/70 | HR 83 | Ht 61.0 in | Wt 168.8 lb

## 2021-03-07 DIAGNOSIS — Z3A35 35 weeks gestation of pregnancy: Secondary | ICD-10-CM

## 2021-03-07 DIAGNOSIS — O0993 Supervision of high risk pregnancy, unspecified, third trimester: Secondary | ICD-10-CM

## 2021-03-07 NOTE — Patient Instructions (Signed)
Preterm Labor The normal length of a pregnancy is 39-41 weeks. Preterm labor is when labor starts before 37 completed weeks of pregnancy. Babies who are born prematurely and survive may not be fully developed and may be at an increased risk for long-term problems such as cerebral palsy, developmental delays, and vision andhearing problems. Babies who are born too early may have problems soon after birth. Premature babies may have problems regulating blood sugar, body temperature, heart rate, and breathing rate. These babies often have trouble with feeding. The risk ofhaving problems is highest for babies who are born before 78 weeks of pregnancy. What are the causes? The exact cause of this condition is not known. What increases the risk? You are more likely to have preterm labor if you have certain risk factors that relate to your medical history, problems with present and past pregnancies, andlifestyle factors. Medical history You have abnormalities of the uterus, including a short cervix. You have STIs (sexually transmitted infections) or other infections of the urinary tract and the vagina. You have chronic illnesses, such as blood clotting problems, diabetes, or high blood pressure. You are overweight or underweight. Present and past pregnancies You have had preterm labor before. You are pregnant with twins or other multiples. You have been diagnosed with a condition in which the placenta covers your cervix (placenta previa). You waited less than 18 months between giving birth and becoming pregnant again. Your unborn baby has some abnormalities. You have vaginal bleeding during pregnancy. You became pregnant through in vitro fertilization (IVF). Lifestyle and environmental factors You use tobacco products or drink alcohol. You use drugs. You have stress and no social support. You experience domestic violence. You are exposed to certain chemicals or environmental pollutants. Other  factors You are younger than age 45 or older than age 77. What are the signs or symptoms? Symptoms of this condition include: Cramps similar to those that can happen during a menstrual period. The cramps may happen with diarrhea. Pain in the abdomen or lower back. Regular contractions that may feel like tightening of the abdomen. A feeling of increased pressure in the pelvis. Increased watery or bloody mucus discharge from the vagina. Water breaking (ruptured amniotic sac). How is this diagnosed? This condition is diagnosed based on: Your medical history and a physical exam. A pelvic exam. An ultrasound. Monitoring your uterus for contractions. Other tests, including: A swab of the cervix to check for a chemical called fetal fibronectin. Urine tests. How is this treated? Treatment for this condition depends on the length of your pregnancy, your condition, and the health of your baby. Treatment may include: Taking medicines, such as: Hormone medicines. These may be given early in pregnancy to help support the pregnancy. Medicines to stop contractions. Medicines to help mature the baby's lungs. These may be prescribed if the risk of delivery is high. Medicines to help protect your baby from brain and nerve complications such as cerebral palsy. Bed rest. If the labor happens before 34 weeks of pregnancy, you may need to stay in the hospital. Delivery of the baby. Follow these instructions at home:  Do not use any products that contain nicotine or tobacco. These products include cigarettes, chewing tobacco, and vaping devices, such as e-cigarettes. If you need help quitting, ask your health care provider. Do not drink alcohol. Take over-the-counter and prescription medicines only as told by your health care provider. Rest as told by your health care provider. Return to your normal activities as told by your  health care provider. Ask your health care provider what activities are safe for  you. Keep all follow-up visits. This is important. How is this prevented? To increase your chance of having a full-term pregnancy: Do not use drugs or take medicines that have not been prescribed to you during your pregnancy. Talk with your health care provider before taking any herbal supplements, even if you have been taking them regularly. Make sure you gain a healthy amount of weight during your pregnancy. Watch for infection. If you think that you might have an infection, get it checked right away. Symptoms of infection may include: Fever. Abnormal vaginal discharge or discharge that smells bad. Pain or burning with urination. Needing to urinate urgently. Frequently urinating or passing small amounts of urine frequently. Blood in your urine or urine that smells bad or unusual. Where to find more information U.S. Department of Health and Programmer, systems on Women's Health: [place] [employer] of Obstetricians and Gynecologists: www.acog.org Centers for Disease Control and Prevention, Preterm Birth: http://www.wolf.info/ Contact a health care provider if: You think you are going into preterm labor. You have signs or symptoms of preterm labor. You have symptoms of infection. Get help right away if: You are having regular, painful contractions every 5 minutes or less. Your water breaks. Summary Preterm labor is labor that starts before you reach 37 weeks of pregnancy. Delivering your baby early increases your baby's risk of developing long-term problems. You are more likely to have preterm labor if you have certain risk factors that relate to your medical history, problems with present and past pregnancies, and lifestyle factors. Keep all follow-up visits. This is important. Contact a health care provider if you have signs or symptoms of preterm labor. This information is not intended to replace advice given to you by your health care provider. Make sure you discuss  any questions you have with your healthcare provider. Document Revised: 09/05/2020 Document Reviewed: 09/05/2020 Elsevier Patient Education  2022 [employer].

## 2021-03-07 NOTE — Progress Notes (Signed)
Routine Prenatal Care Visit  Subjective  Alexandria Orozco is a 35 y.o. [phone] at [redacted]w[redacted]d being seen today for ongoing prenatal care.  She is currently monitored for the following issues for this high-risk pregnancy and has Carcinoma in situ of exocervix; S/P conization of cervix; Supervision of high risk pregnancy in first trimester; Nausea and vomiting during pregnancy; Headache in pregnancy, antepartum, first trimester; History of preterm delivery, currently pregnant; Labor and delivery indication for care or intervention; Preterm uterine contractions in third trimester, antepartum; History of preterm delivery, currently pregnant in third trimester; Preterm uterine contractions, antepartum; Labor and delivery, indication for care; and [redacted] weeks gestation of pregnancy on their problem list.  ----------------------------------------------------------------------------------- Patient reports  uterine contractions .   Contractions: Irritability. Vag. Bleeding: None.  Movement: Present. Denies leaking of fluid.  ----------------------------------------------------------------------------------- The following portions of the patient's history were reviewed and updated as appropriate: allergies, current medications, past family history, past medical history, past social history, past surgical history and problem list. Problem list updated.   Objective  Blood pressure 115/70, pulse 83, height 5\' 1"  (1.549 m), weight 168 lb 12.8 oz (76.6 kg), last menstrual period 07/05/2020, unknown if currently breastfeeding. Pregravid weight 148 lb (67.1 kg) Total Weight Gain 20 lb 12.8 oz (9.435 kg) Urinalysis:      Fetal Status: Fetal Heart Rate (bpm): 140 Fundal Height: 35 cm Movement: Present     General:  Alert, oriented and cooperative. Patient is in no acute distress.  Skin: Skin is warm and dry. No rash noted.   Cardiovascular: Normal heart rate noted  Respiratory: Normal respiratory effort, no problems  with respiration noted  Abdomen: Soft, gravid, appropriate for gestational age. Pain/Pressure: Present     Pelvic:  Cervical exam performed Dilation: 1 Effacement (%): 70 Station: -3  Extremities: Normal range of motion.  Edema: None  Mental Status: Normal mood and affect. Normal behavior. Normal judgment and thought content.     Assessment   35 y.o. [id number] at [redacted]w[redacted]d by  04/11/2021, by Last Menstrual Period presenting for routine prenatal visit  Plan   pregnancy5 Problems (from 07/05/20 to present)     Problem Noted Resolved   [redacted] weeks gestation of pregnancy 03/06/2021 by Rod Can, CNM No   Supervision of high risk pregnancy in first trimester 08/24/2020 by Orlie Pollen, CNM No   Overview Addendum 02/21/2021  8:40 AM by Homero Fellers, MD    Clinic [facility] Prenatal Labs  Dating LMP = [redacted]w[redacted]d Blood type: O pos  Genetic Screen NIPS: normal XX Antibody: negative  Anatomic [place] Normal Rubella: Immune Varicella: Immune  GTT  Third trimester: 91 RPR: NR  Rhogam N/A HBsAg: negative  TDaP vaccine                        Flu Shot: declines HIV: negative  Baby Food   breast                              GBS: POSITIVE  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  SVE 1/70/-3 No ferning seen on microscopic examination AFI: 14 cm, vertex position. +FHR 140 bpm.  Discussed that prior cervical cone may of caused cervical scarring making it less likely she will dilate early with contractions. She should come to L&D if painfully contracting, having vaginal bleeding,r  leakage of fluid.    Gestational age appropriate obstetric precautions including but not limited to vaginal bleeding, contractions, leaking of fluid and fetal movement were reviewed in detail with the patient.    Return in about 1 week (around 03/14/2021) for ROB in person.  Homero Fellers MD [facility] 03/07/2021, 8:41 AM

## 2021-03-12 ENCOUNTER — Observation Stay
Admission: EM | Admit: 2021-03-12 | Discharge: 2021-03-12 | Disposition: A | Discharge status: Home / Self Care | Payer: Managed Care, Other (non HMO) | Attending: Obstetrics & Gynecology | Admitting: Obstetrics & Gynecology

## 2021-03-12 ENCOUNTER — Encounter: Payer: Self-pay | Admitting: Obstetrics & Gynecology

## 2021-03-12 DIAGNOSIS — Z3A35 35 weeks gestation of pregnancy: Secondary | ICD-10-CM | POA: Diagnosis not present

## 2021-03-12 DIAGNOSIS — O219 Vomiting of pregnancy, unspecified: Secondary | ICD-10-CM

## 2021-03-12 DIAGNOSIS — O0991 Supervision of high risk pregnancy, unspecified, first trimester: Secondary | ICD-10-CM

## 2021-03-12 DIAGNOSIS — O4703 False labor before 37 completed weeks of gestation, third trimester: Principal | ICD-10-CM | POA: Insufficient documentation

## 2021-03-12 DIAGNOSIS — R519 Headache, unspecified: Secondary | ICD-10-CM

## 2021-03-12 DIAGNOSIS — O09213 Supervision of pregnancy with history of pre-term labor, third trimester: Secondary | ICD-10-CM | POA: Diagnosis not present

## 2021-03-12 DIAGNOSIS — Z3A34 34 weeks gestation of pregnancy: Secondary | ICD-10-CM

## 2021-03-12 MED ORDER — BUTORPHANOL TARTRATE 1 MG/ML IJ SOLN
1.0000 mg | Freq: Once | INTRAMUSCULAR | Status: AC
Start: 2021-03-12 — End: 2021-03-12
  Administered 2021-03-12: 1 mg via INTRAVENOUS
  Filled 2021-03-12: qty 1

## 2021-03-12 MED ORDER — LACTATED RINGERS IV BOLUS
500.0000 mL | Freq: Once | INTRAVENOUS | Status: AC
  Administered 2021-03-12: 500 mL via INTRAVENOUS

## 2021-03-12 MED ORDER — CYCLOBENZAPRINE HCL 10 MG PO TABS: 5.0000 mg | ORAL_TABLET | Freq: Every day | ORAL | 0 refills | Status: AC

## 2021-03-12 NOTE — OB Triage Note (Signed)
Pt presents to unit c/o contractions that have progressively gotten worse throughout the day. Pt also reports clear LOF all day. Pt reports +FM, denies vaginal bleeding. Initial FHTs 150s. Vital signs WDL.

## 2021-03-12 NOTE — Discharge Summary (Signed)
Final Progress Note  Patient ID: Alexandria Orozco MRN: [id number] DOB/AGE: 02/28/1986 35 y.o.  Admit date: 03/12/2021 Admitting provider: Imagene Riches, CNM Discharge date: 03/12/2021   Admission Diagnoses: preterm uterine contractions  Discharge Diagnoses:  Active Problems:   Preterm uterine contractions in third trimester, antepartum  Reactive  fetal heart tones Uterine irritability.  History of Present Illness: The patient is a 35 y.o. female [phone] at [redacted]w[redacted]d who presents for evaluation of irregular contractions. She has a hx of preterm delivery and has been seen numerous times over the last weeks with the same complaint of irritable cramping.. She reports an active fetus, and denies any leaking of fluid. Due to her hx, she was offered Makena, this pregnancy, which she  refused.  She also denies vaginal bleeding.  Past Medical History:  Diagnosis Date   Anxiety    Cancer ([place])    GERD (gastroesophageal reflux disease)    WITH PREGNANCY ONLY   History of methicillin resistant staphylococcus aureus (MRSA) 2018   Migraines    MIGRAINES   S/P conization of cervix 11/23/2018   S/P LEEP 12/14/2018    Past Surgical History:  Procedure Laterality Date   CERVICAL CONIZATION W/BX N/A 11/23/2018   Procedure: COLD KNIFE CONIZATION CERVIX WITH BIOPSY;  Surgeon: Rubie Maid, MD;  Location: [facility];  Service: Gynecology;  Laterality: N/A;   CHOLECYSTECTOMY     ESOPHAGOGASTRODUODENOSCOPY (EGD) WITH PROPOFOL N/A 08/06/2017   Procedure: ESOPHAGOGASTRODUODENOSCOPY (EGD) WITH PROPOFOL;  Surgeon: Lucilla Lame, MD;  Location: [place];  Service: Endoscopy;  Laterality: N/A;   TONSILLECTOMY      No current facility-administered medications on file prior to encounter.   Current Outpatient Medications on File Prior to Encounter  Medication Sig Dispense Refill   Prenatal Vit-Fe Fumarate-FA (PRENATAL MULTIVITAMIN) TABS tablet Take 1 tablet by mouth daily at 12 noon.       Allergies  Allergen Reactions   Ampicillin Rash    Did it involve swelling of the face/tongue/throat, SOB, or low BP? No Did it involve sudden or severe rash/hives, skin peeling, or any reaction on the inside of your mouth or nose? No Did you need to seek medical attention at a hospital or doctor's office? No When did it last happen?      3-4 years ago If all above answers are "NO", may proceed with cephalosporin use.   Morphine And Related Itching and Rash   Penicillins Rash    Did it involve swelling of the face/tongue/throat, SOB, or low BP? No Did it involve sudden or severe rash/hives, skin peeling, or any reaction on the inside of your mouth or nose? No Did you need to seek medical attention at a hospital or doctor's office? No When did it last happen?      3-4 years ago If all above answers are "NO", may proceed with cephalosporin use.    Sulfa Antibiotics Rash    Social History   Socioeconomic History   Marital status: Married    Spouse name: Not on file   Number of children: Not on file   Years of education: Not on file   Highest education level: Not on file  Occupational History   Not on file  Tobacco Use   Smoking status: Never   Smokeless tobacco: Never  Vaping Use   Vaping Use: Never used  Substance and Sexual Activity   Alcohol use: No   Drug use: No   Sexual activity: Yes    Birth control/protection:  None    Comment: undecided  Other Topics Concern   Not on file  Social History Narrative   Lives at home, independent   Social Determinants of Health   Financial Resource Strain: Not on file  Food Insecurity: Not on file  Transportation Needs: Not on file  Physical Activity: Not on file  Stress: Not on file  Social Connections: Not on file  Intimate Partner Violence: Not on file    Family History  Problem Relation Age of Onset   Diabetes Mother    Heart attack Father    Diabetes Paternal Uncle    Diabetes Maternal Grandmother     Pancreatitis Maternal Grandmother    Breast cancer Neg Hx    Ovarian cancer Neg Hx    Colon cancer Neg Hx      Review of Systems  Constitutional: Negative.   HENT: Negative.    Eyes: Negative.   Respiratory: Negative.    Cardiovascular: Negative.   Gastrointestinal: Negative.   Genitourinary: Negative.   Musculoskeletal: Negative.   Skin: Negative.   Neurological: Negative.   Endo/Heme/Allergies: Negative.   Psychiatric/Behavioral: Negative.      Physical Exam: BP 116/77 (BP Location: Right Arm)   Pulse 85   Temp 98.5 F (36.9 C) (Oral)   Resp 15   Ht 5\' 1"  (1.549 m)   Wt 75.8 kg   LMP 07/05/2020   BMI 31.55 kg/m   Physical Exam Constitutional:      Appearance: Normal appearance.  Genitourinary:     Vulva normal.     Genitourinary Comments: SVE: 1cm/thick/out of the pelvis.  HENT:     Head: Normocephalic and atraumatic.  Cardiovascular:     Rate and Rhythm: Normal rate and regular rhythm.     Pulses: Normal pulses.     Heart sounds: Normal heart sounds.  Pulmonary:     Effort: Pulmonary effort is normal.     Breath sounds: Normal breath sounds.  Abdominal:     General: Bowel sounds are normal.     Palpations: Abdomen is soft.     Comments: Gravid abdomen.  Musculoskeletal:        General: Normal range of motion.     Cervical back: Normal range of motion and neck supple.  Neurological:     General: No focal deficit present.     Mental Status: She is alert and oriented to person, place, and time.  Skin:    General: Skin is warm and dry.  Psychiatric:        Mood and Affect: Mood normal.        Behavior: Behavior normal.     Comments: Affect and manner slightly hostile .    Consults: None  Significant Findings/ Diagnostic Studies: Reactive NST. No labs  Procedures: EFM NST Baseline FHR: 140 beats/min Variability: moderate Accelerations: present Decelerations: absent Tocometry: irregular contractions that palpate very mild. Patient talks through  these.  Interpretation:  INDICATIONS: rule out uterine contractions RESULTS:  A NST procedure was performed with FHR monitoring and a normal baseline established, appropriate time of 20-40 minutes of evaluation, and accels >2 seen w 15x15 characteristics.  Results show a REACTIVE NST.    Hospital Course: The patient was admitted to Labor and Delivery Triage for observation. Her history is significant for preterm labor. This pregnancy she declined Makena injections, and she has received BMZ during this pregnancy. She was monitored and regular contractions seen initiailly, which then spaced apart. The fetal heart tones were reactive and  reassuring. Both cervical exams showed no cervical change over the course of several hours. She was given additional  lV fluid hydration.The patient was offered IM pain medication  and refused,. IV stadol was then given, and she was discharged home in care of her husband with plans for follow up in two days at [facility]. A prescription for Flexeril was sent to her pharmacy to assist with some back pain and rest. Other suggestions made to her include- deep warm baths at home, Benedryl at night with Tylenol for sleep or flexeril.  We discussed delivery once she reaches 36 + weeks.  Discharge Condition: good  Disposition: Discharge disposition: 01-Home or Self Care       Diet: Regular diet  Discharge Activity: No sex for 2 weeks  Discharge Instructions     Discharge activity:  No Restrictions   Complete by: As directed    Discharge diet:  No restrictions   Complete by: As directed    Do not have sex or do anything that might make you have an orgasm   Complete by: As directed    Fetal Kick Count:  Lie on our left side for one hour after a meal, and count the number of times your baby kicks.  If it is less than 5 times, get up, move around and drink some juice.  Repeat the test 30 minutes later.  If it is still less than 5 kicks in an hour, notify your  doctor.   Complete by: As directed    LABOR:  When conractions begin, you should start to time them from the beginning of one contraction to the beginning  of the next.  When contractions are 5 - 10 minutes apart or less and have been regular for at least an hour, you should call your health care provider.   Complete by: As directed    Notify physician for bleeding from the vagina   Complete by: As directed    Notify physician for blurring of vision or spots before the eyes   Complete by: As directed    Notify physician for chills or fever   Complete by: As directed    Notify physician for fainting spells, "black outs" or loss of consciousness   Complete by: As directed    Notify physician for increase in vaginal discharge   Complete by: As directed    Notify physician for leaking of fluid   Complete by: As directed    Notify physician for pain or burning when urinating   Complete by: As directed    Notify physician for pelvic pressure (sudden increase)   Complete by: As directed    Notify physician for severe or continued nausea or vomiting   Complete by: As directed    Notify physician for sudden gushing of fluid from the vagina (with or without continued leaking)   Complete by: As directed    Notify physician for sudden, constant, or occasional abdominal pain   Complete by: As directed    Notify physician if baby moving less than usual   Complete by: As directed       Allergies as of 03/12/2021       Reactions   Ampicillin Rash   Did it involve swelling of the face/tongue/throat, SOB, or low BP? No Did it involve sudden or severe rash/hives, skin peeling, or any reaction on the inside of your mouth or nose? No Did you need to seek medical attention at a hospital or doctor's  office? No When did it last happen?      3-4 years ago If all above answers are "NO", may proceed with cephalosporin use.   Morphine And Related Itching, Rash   Penicillins Rash   Did it involve  swelling of the face/tongue/throat, SOB, or low BP? No Did it involve sudden or severe rash/hives, skin peeling, or any reaction on the inside of your mouth or nose? No Did you need to seek medical attention at a hospital or doctor's office? No When did it last happen?      3-4 years ago If all above answers are "NO", may proceed with cephalosporin use.   Sulfa Antibiotics Rash        Medication List     TAKE these medications    cyclobenzaprine 10 MG tablet Commonly known as: FLEXERIL Take 0.5 tablets (5 mg total) by mouth at bedtime for 10 days.   prenatal multivitamin Tabs tablet Take 1 tablet by mouth daily at 12 noon.         Total time spent taking care of this patient: 45 minutes.  Signed: Imagene Riches, CNM  03/12/2021, 10:18 PM

## 2021-03-12 NOTE — OB Triage Note (Signed)
Discharge instructions and labor precautions thoroughly reviewed with CNM present with patient and significant other. Reminded to pick up prescriptions from pharmacy. All questions answered. Patient discharged home.

## 2021-03-15 ENCOUNTER — Encounter: Payer: Managed Care, Other (non HMO) | Admitting: Advanced Practice Midwife

## 2021-03-16 ENCOUNTER — Other Ambulatory Visit: Payer: Self-pay

## 2021-03-16 ENCOUNTER — Ambulatory Visit (INDEPENDENT_AMBULATORY_CARE_PROVIDER_SITE_OTHER): Payer: Managed Care, Other (non HMO) | Admitting: Obstetrics

## 2021-03-16 VITALS — BP 120/80 | Wt 172.0 lb

## 2021-03-16 DIAGNOSIS — Z3A36 36 weeks gestation of pregnancy: Secondary | ICD-10-CM

## 2021-03-16 DIAGNOSIS — O0993 Supervision of high risk pregnancy, unspecified, third trimester: Secondary | ICD-10-CM

## 2021-03-16 LAB — POCT URINALYSIS DIPSTICK OB
Glucose, UA: NEGATIVE
POC,PROTEIN,UA: NEGATIVE

## 2021-03-16 NOTE — Addendum Note (Signed)
Addended by: Quintella Baton D on: 03/16/2021 03:22 PM   Modules accepted: Orders

## 2021-03-16 NOTE — Progress Notes (Signed)
Routine Prenatal Care Visit  Subjective  Alexandria Orozco is a 35 y.o. [phone] at [redacted]w[redacted]d being seen today for ongoing prenatal care.  She is currently monitored for the following issues for this high-risk pregnancy and has Carcinoma in situ of exocervix; S/P conization of cervix; Supervision of high risk pregnancy in first trimester; Nausea and vomiting during pregnancy; Headache in pregnancy, antepartum, first trimester; History of preterm delivery, currently pregnant; Labor and delivery indication for care or intervention; Preterm uterine contractions in third trimester, antepartum; History of preterm delivery, currently pregnant in third trimester; Preterm uterine contractions, antepartum; Labor and delivery, indication for care; and [redacted] weeks gestation of pregnancy on their problem list.  ----------------------------------------------------------------------------------- Patient reports no bleeding, no leaking and occasional contractions.  She has been seen several times over the last few weeks at [facility] for labor checks. Her cervix has palpabel scar tissue, so they contracts, but has not dilated more than one cm. Contractions: Irregular. Vag. Bleeding: None.  Movement: Present. Leaking Fluid denies.  ----------------------------------------------------------------------------------- The following portions of the patient's history were reviewed and updated as appropriate: allergies, current medications, past family history, past medical history, past social history, past surgical history and problem list. Problem list updated.  Objective  Blood pressure 120/80, weight 172 lb (78 kg), last menstrual period 07/05/2020, unknown if currently breastfeeding. Pregravid weight 148 lb (67.1 kg) Total Weight Gain 24 lb (10.9 kg) Urinalysis: Urine Protein    Urine Glucose    Fetal Status:     Movement: Present     General:  Alert, oriented and cooperative. Patient is in no acute distress.  Skin: Skin is  warm and dry. No rash noted.   Cardiovascular: Normal heart rate noted  Respiratory: Normal respiratory effort, no problems with respiration noted  Abdomen: Soft, gravid, appropriate for gestational age. Pain/Pressure: Present     Pelvic:  Cervical exam performed      1cm/50%/ballotable. crvical scarring noted. Her ecrvix hurts when checked.  Extremities: Normal range of motion.     Mental Status: Normal mood and affect. Normal behavior. Normal judgment and thought content.   Assessment   35 y.o. [id number] at [redacted]w[redacted]d by  04/11/2021, by Last Menstrual Period presenting for routine prenatal visit  Plan   pregnancy5 Problems (from 07/05/20 to present)    Problem Noted Resolved   [redacted] weeks gestation of pregnancy 03/06/2021 by Rod Can, CNM No   Supervision of high risk pregnancy in first trimester 08/24/2020 by Orlie Pollen, CNM No   Overview Addendum 02/21/2021  8:40 AM by Homero Fellers, MD    Clinic [facility] Prenatal Labs  Dating LMP = [redacted]w[redacted]d Blood type: O pos  Genetic Screen NIPS: normal XX Antibody: negative  Anatomic [place] Normal Rubella: Immune Varicella: Immune  GTT  Third trimester: 91 RPR: NR  Rhogam N/A HBsAg: negative  TDaP vaccine                        Flu Shot: declines HIV: negative  Baby Food   breast                              GBS: POSITIVE  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 Please refer  to After Visit Summary for other counseling recommendations.   Return in about 1 week (around 03/23/2021) for return OB.  Imagene Riches, CNM  03/16/2021 3:06 PM

## 2021-03-20 LAB — STREP GP B CULTURE+RFLX: Strep Gp B Culture+Rflx: POSITIVE — AB

## 2021-03-20 LAB — STREP GP B SUSCEPTIBILITY

## 2021-03-28 ENCOUNTER — Ambulatory Visit (INDEPENDENT_AMBULATORY_CARE_PROVIDER_SITE_OTHER): Payer: Managed Care, Other (non HMO) | Admitting: Obstetrics and Gynecology

## 2021-03-28 ENCOUNTER — Other Ambulatory Visit: Payer: Self-pay

## 2021-03-28 VITALS — BP 132/87 | Wt 172.0 lb

## 2021-03-28 DIAGNOSIS — O09893 Supervision of other high risk pregnancies, third trimester: Secondary | ICD-10-CM

## 2021-03-28 DIAGNOSIS — Z3A38 38 weeks gestation of pregnancy: Secondary | ICD-10-CM

## 2021-03-28 DIAGNOSIS — O0991 Supervision of high risk pregnancy, unspecified, first trimester: Secondary | ICD-10-CM

## 2021-03-28 DIAGNOSIS — Z3480 Encounter for supervision of other normal pregnancy, unspecified trimester: Secondary | ICD-10-CM

## 2021-03-28 DIAGNOSIS — O0993 Supervision of high risk pregnancy, unspecified, third trimester: Secondary | ICD-10-CM

## 2021-03-28 DIAGNOSIS — Z9889 Other specified postprocedural states: Secondary | ICD-10-CM

## 2021-03-28 NOTE — Progress Notes (Signed)
Routine Prenatal Care Visit  Subjective  Alexandria Orozco is a 35 y.o. [phone] at [redacted]w[redacted]d being seen today for ongoing prenatal care.  She is currently monitored for the following issues for this low-risk pregnancy and has Carcinoma in situ of exocervix; S/P conization of cervix; Supervision of high risk pregnancy in first trimester; Nausea and vomiting during pregnancy; Headache in pregnancy, antepartum, first trimester; History of preterm delivery, currently pregnant; Labor and delivery indication for care or intervention; Preterm uterine contractions in third trimester, antepartum; History of preterm delivery, currently pregnant in third trimester; Preterm uterine contractions, antepartum; Labor and delivery, indication for care; and [redacted] weeks gestation of pregnancy on their problem list.  ----------------------------------------------------------------------------------- Patient reports no complaints.    .  .   . Denies leaking of fluid.  ----------------------------------------------------------------------------------- The following portions of the patient's history were reviewed and updated as appropriate: allergies, current medications, past family history, past medical history, past social history, past surgical history and problem list. Problem list updated.   Objective  Blood pressure 132/87, weight 172 lb (78 kg), last menstrual period 07/05/2020, unknown if currently breastfeeding. Pregravid weight 148 lb (67.1 kg) Total Weight Gain 24 lb (10.9 kg) Urinalysis:      Fetal Status:           General:  Alert, oriented and cooperative. Patient is in no acute distress.  Skin: Skin is warm and dry. No rash noted.   Cardiovascular: Normal heart rate noted  Respiratory: Normal respiratory effort, no problems with respiration noted  Abdomen: Soft, gravid, appropriate for gestational age.       Pelvic:  Cervical exam deferred        Extremities: Normal range of motion.     ental  Status: Normal mood and affect. Normal behavior. Normal judgment and thought content.     Assessment   35 y.o. [id number] at [redacted]w[redacted]d by  04/11/2021, by Last Menstrual Period presenting for routine prenatal visit  Plan   pregnancy5 Problems (from 07/05/20 to present)     Problem Noted Resolved   [redacted] weeks gestation of pregnancy 03/06/2021 by Rod Can, CNM No   Supervision of high risk pregnancy in first trimester 08/24/2020 by Orlie Pollen, CNM No   Overview Addendum 03/20/2021  2:46 PM by Imagene Riches, [place] Prenatal Labs  Dating LMP = [redacted]w[redacted]d Blood type: O pos  Genetic Screen NIPS: normal XX Antibody: negative  Anatomic [place] Normal Rubella: Immune Varicella: Immune  GTT  Third trimester: 91 RPR: NR  Rhogam N/A HBsAg: negative  TDaP vaccine                        Flu Shot: declines HIV: negative  Baby Food   breast                              GBS: POSITIVE- clindamycin resistant- use Vancomycin.  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    IOL 02/03/2021 05:00 AM  Return if symptoms worsen or fail to improve.  Malachy Mood, MD, [facility], [facility] 03/28/2021, 11:46 AM

## 2021-03-28 NOTE — Progress Notes (Signed)
ROB - no concerns. RM 2

## 2021-04-01 ENCOUNTER — Encounter: Payer: Self-pay | Admitting: Obstetrics and Gynecology

## 2021-04-02 ENCOUNTER — Other Ambulatory Visit
Admission: RE | Admit: 2021-04-02 | Discharge: 2021-04-02 | Disposition: A | Discharge status: Home / Self Care | Payer: Managed Care, Other (non HMO) | Source: Ambulatory Visit | Attending: Obstetrics and Gynecology | Admitting: Obstetrics and Gynecology

## 2021-04-02 ENCOUNTER — Other Ambulatory Visit: Payer: Self-pay

## 2021-04-02 DIAGNOSIS — Z20822 Contact with and (suspected) exposure to covid-19: Secondary | ICD-10-CM | POA: Insufficient documentation

## 2021-04-02 DIAGNOSIS — Z01812 Encounter for preprocedural laboratory examination: Secondary | ICD-10-CM | POA: Insufficient documentation

## 2021-04-02 LAB — SARS CORONAVIRUS 2 (TAT 6-24 HRS): SARS Coronavirus 2: NEGATIVE

## 2021-04-05 ENCOUNTER — Other Ambulatory Visit: Payer: Self-pay

## 2021-04-05 ENCOUNTER — Encounter: Payer: Self-pay | Admitting: Obstetrics and Gynecology

## 2021-04-05 ENCOUNTER — Inpatient Hospital Stay
Admission: EM | Admit: 2021-04-05 | Discharge: 2021-04-08 | DRG: 787 | Disposition: A | Discharge status: Home / Self Care | Payer: Managed Care, Other (non HMO) | Attending: Obstetrics and Gynecology | Admitting: Obstetrics and Gynecology

## 2021-04-05 ENCOUNTER — Inpatient Hospital Stay: Payer: Managed Care, Other (non HMO) | Admitting: Anesthesiology

## 2021-04-05 DIAGNOSIS — Z20822 Contact with and (suspected) exposure to covid-19: Secondary | ICD-10-CM | POA: Diagnosis present

## 2021-04-05 DIAGNOSIS — Z3480 Encounter for supervision of other normal pregnancy, unspecified trimester: Secondary | ICD-10-CM

## 2021-04-05 DIAGNOSIS — O99892 Other specified diseases and conditions complicating childbirth: Secondary | ICD-10-CM | POA: Diagnosis not present

## 2021-04-05 DIAGNOSIS — Z3A39 39 weeks gestation of pregnancy: Secondary | ICD-10-CM

## 2021-04-05 DIAGNOSIS — Z880 Allergy status to penicillin: Secondary | ICD-10-CM

## 2021-04-05 DIAGNOSIS — Z3490 Encounter for supervision of normal pregnancy, unspecified, unspecified trimester: Secondary | ICD-10-CM | POA: Diagnosis present

## 2021-04-05 DIAGNOSIS — Z8614 Personal history of Methicillin resistant Staphylococcus aureus infection: Secondary | ICD-10-CM | POA: Diagnosis not present

## 2021-04-05 DIAGNOSIS — O219 Vomiting of pregnancy, unspecified: Secondary | ICD-10-CM

## 2021-04-05 DIAGNOSIS — D62 Acute posthemorrhagic anemia: Secondary | ICD-10-CM | POA: Diagnosis not present

## 2021-04-05 DIAGNOSIS — O9081 Anemia of the puerperium: Secondary | ICD-10-CM | POA: Diagnosis not present

## 2021-04-05 DIAGNOSIS — O99824 Streptococcus B carrier state complicating childbirth: Secondary | ICD-10-CM | POA: Diagnosis present

## 2021-04-05 DIAGNOSIS — O99891 Other specified diseases and conditions complicating pregnancy: Secondary | ICD-10-CM | POA: Diagnosis not present

## 2021-04-05 DIAGNOSIS — O26891 Other specified pregnancy related conditions, first trimester: Secondary | ICD-10-CM

## 2021-04-05 DIAGNOSIS — O26893 Other specified pregnancy related conditions, third trimester: Secondary | ICD-10-CM | POA: Diagnosis present

## 2021-04-05 DIAGNOSIS — Z9889 Other specified postprocedural states: Secondary | ICD-10-CM | POA: Diagnosis not present

## 2021-04-05 LAB — CBC
HCT: 31.6 % — ABNORMAL LOW (ref 36.0–46.0)
Hemoglobin: 10.6 g/dL — ABNORMAL LOW (ref 12.0–15.0)
MCH: 30.6 pg (ref 26.0–34.0)
MCHC: 33.5 g/dL (ref 30.0–36.0)
MCV: 91.3 fL (ref 80.0–100.0)
Platelets: 215 10*3/uL (ref 150–400)
RBC: 3.46 MIL/uL — ABNORMAL LOW (ref 3.87–5.11)
RDW: 13.1 % (ref 11.5–15.5)
WBC: 8.1 10*3/uL (ref 4.0–10.5)
nRBC: 0 % (ref 0.0–0.2)

## 2021-04-05 LAB — TYPE AND SCREEN
ABO/RH(D): O POS
Antibody Screen: NEGATIVE

## 2021-04-05 LAB — RPR: RPR Ser Ql: NONREACTIVE

## 2021-04-05 MED ORDER — LACTATED RINGERS IV SOLN
500.0000 mL | Freq: Once | INTRAVENOUS | Status: AC
  Administered 2021-04-06: 500 mL via INTRAVENOUS

## 2021-04-05 MED ORDER — DIPHENHYDRAMINE HCL 50 MG/ML IJ SOLN
12.5000 mg | INTRAMUSCULAR | Status: DC | PRN
  Administered 2021-04-05 – 2021-04-06 (×2): 12.5 mg via INTRAVENOUS
  Filled 2021-04-05 (×2): qty 1

## 2021-04-05 MED ORDER — BUPIVACAINE HCL (PF) 0.25 % IJ SOLN
INTRAMUSCULAR | Status: DC | PRN
  Administered 2021-04-05 (×2): 4 mL via EPIDURAL

## 2021-04-05 MED ORDER — PHENYLEPHRINE 40 MCG/ML (10ML) SYRINGE FOR IV PUSH (FOR BLOOD PRESSURE SUPPORT): 80.0000 ug | PREFILLED_SYRINGE | INTRAVENOUS | Status: DC | PRN

## 2021-04-05 MED ORDER — LACTATED RINGERS IV SOLN: INTRAVENOUS | Status: DC

## 2021-04-05 MED ORDER — OXYTOCIN 10 UNIT/ML IJ SOLN
INTRAMUSCULAR | Status: AC
  Filled 2021-04-05: qty 2

## 2021-04-05 MED ORDER — HYDROMORPHONE HCL 1 MG/ML IJ SOLN
INTRAMUSCULAR | Status: AC
  Filled 2021-04-05: qty 1

## 2021-04-05 MED ORDER — LIDOCAINE HCL (PF) 1 % IJ SOLN: 30.0000 mL | INTRAMUSCULAR | Status: DC | PRN

## 2021-04-05 MED ORDER — FENTANYL CITRATE (PF) 100 MCG/2ML IJ SOLN
25.0000 ug | Freq: Once | INTRAMUSCULAR | Status: DC
Start: 2021-04-05 — End: 2021-04-06

## 2021-04-05 MED ORDER — OXYTOCIN-SODIUM CHLORIDE 30-0.9 UT/500ML-% IV SOLN
1.0000 m[IU]/min | INTRAVENOUS | Status: DC
  Administered 2021-04-05: 1 m[IU]/min via INTRAVENOUS
  Filled 2021-04-05: qty 500

## 2021-04-05 MED ORDER — EPHEDRINE 5 MG/ML INJ: 10.0000 mg | INTRAVENOUS | Status: DC | PRN

## 2021-04-05 MED ORDER — AMMONIA AROMATIC IN INHA
RESPIRATORY_TRACT | Status: AC
  Filled 2021-04-05: qty 10

## 2021-04-05 MED ORDER — LIDOCAINE-EPINEPHRINE (PF) 1.5 %-1:200000 IJ SOLN
INTRAMUSCULAR | Status: DC | PRN
  Administered 2021-04-05: 3 mL via PERINEURAL

## 2021-04-05 MED ORDER — ONDANSETRON HCL 4 MG/2ML IJ SOLN: 4.0000 mg | Freq: Four times a day (QID) | INTRAMUSCULAR | Status: DC | PRN

## 2021-04-05 MED ORDER — FENTANYL CITRATE (PF) 100 MCG/2ML IJ SOLN
INTRAMUSCULAR | Status: AC
  Filled 2021-04-05: qty 2

## 2021-04-05 MED ORDER — SOD CITRATE-CITRIC ACID 500-334 MG/5ML PO SOLN: 30.0000 mL | ORAL | Status: DC | PRN

## 2021-04-05 MED ORDER — OXYTOCIN BOLUS FROM INFUSION: 333.0000 mL | Freq: Once | INTRAVENOUS | Status: DC

## 2021-04-05 MED ORDER — ACETAMINOPHEN 325 MG PO TABS: 650.0000 mg | ORAL_TABLET | ORAL | Status: DC | PRN

## 2021-04-05 MED ORDER — CEFAZOLIN SODIUM-DEXTROSE 1-4 GM/50ML-% IV SOLN
1.0000 g | Freq: Three times a day (TID) | INTRAVENOUS | Status: DC
  Administered 2021-04-05 – 2021-04-06 (×3): 1 g via INTRAVENOUS
  Filled 2021-04-05 (×4): qty 50

## 2021-04-05 MED ORDER — LIDOCAINE HCL (PF) 1 % IJ SOLN
INTRAMUSCULAR | Status: DC | PRN
  Administered 2021-04-05: 3 mL via SUBCUTANEOUS

## 2021-04-05 MED ORDER — HYDROMORPHONE HCL 1 MG/ML IJ SOLN
0.5000 mg | Freq: Once | INTRAMUSCULAR | Status: AC | PRN
  Administered 2021-04-05: 0.5 mg via INTRAVENOUS

## 2021-04-05 MED ORDER — LIDOCAINE HCL (PF) 1 % IJ SOLN
INTRAMUSCULAR | Status: AC
  Filled 2021-04-05: qty 30

## 2021-04-05 MED ORDER — LACTATED RINGERS IV SOLN
500.0000 mL | INTRAVENOUS | Status: DC | PRN
  Administered 2021-04-06: 1000 mL via INTRAVENOUS

## 2021-04-05 MED ORDER — CEFAZOLIN SODIUM-DEXTROSE 2-4 GM/100ML-% IV SOLN
2.0000 g | Freq: Once | INTRAVENOUS | Status: AC
Start: 2021-04-05 — End: 2021-04-05
  Administered 2021-04-05: 2 g via INTRAVENOUS
  Filled 2021-04-05: qty 100

## 2021-04-05 MED ORDER — FENTANYL 2.5 MCG/ML W/ROPIVACAINE 0.15% IN NS 100 ML EPIDURAL (ARMC)
EPIDURAL | Status: AC
  Filled 2021-04-05: qty 100

## 2021-04-05 MED ORDER — MISOPROSTOL 200 MCG PO TABS
ORAL_TABLET | ORAL | Status: AC
  Filled 2021-04-05: qty 4

## 2021-04-05 MED ORDER — OXYTOCIN-SODIUM CHLORIDE 30-0.9 UT/500ML-% IV SOLN
2.5000 [IU]/h | INTRAVENOUS | Status: DC
  Filled 2021-04-05: qty 500

## 2021-04-05 MED ORDER — TERBUTALINE SULFATE 1 MG/ML IJ SOLN: 0.2500 mg | Freq: Once | INTRAMUSCULAR | Status: DC | PRN

## 2021-04-05 MED ORDER — MISOPROSTOL 25 MCG QUARTER TABLET
25.0000 ug | ORAL_TABLET | ORAL | Status: DC | PRN
  Administered 2021-04-05 (×2): 25 ug via VAGINAL
  Filled 2021-04-05 (×2): qty 1

## 2021-04-05 MED ORDER — FENTANYL 2.5 MCG/ML W/ROPIVACAINE 0.15% IN NS 100 ML EPIDURAL (ARMC)
12.0000 mL/h | EPIDURAL | Status: DC
  Administered 2021-04-05 – 2021-04-06 (×3): 12 mL/h via EPIDURAL
  Filled 2021-04-05 (×2): qty 100

## 2021-04-05 NOTE — Progress Notes (Signed)
Labor Check  Subj:  Complaints: comfortable overall   Obj:  BP 121/75 (BP Location: Right Arm)   Pulse 71   Temp 98.4 F (36.9 C) (Oral)   Resp 18   Ht 5\' 1"  (1.549 m)   Wt 78 kg   LMP 07/05/2020   BMI 32.50 kg/m     Cervix: Dilation: 1.5 / Effacement (%): 90 / Station: -3   Foley catheter placed through cervix without difficulty. 40 mL instilled in balloon.  Baseline FHR: 145 beats/min   Variability: moderate   Accelerations: present   Decelerations: absent Contractions: present frequency: infrequent Overall assessment: cat 1  Female chaperone present for pelvic exam:   A/P: 35 y.o. [id number] female at [redacted]w[redacted]d with IOL.  1.  Labor: continue misoprostol, now with foley balloon to help break up cervical scar tissue  2.  FWB: reassuring, Overall assessment: category 1  3.  GBS positive - ancef per protocol  4.  Pain: prn 5.  Recheck: as needed.    Prentice Docker, MD, [facility], [facility] 04/05/2021 12:36 PM

## 2021-04-05 NOTE — Progress Notes (Signed)
Labor Check  Subj:  Complaints: comfortable with epidural   Obj:  BP 112/71   Pulse 63   Temp 98 F (36.7 C) (Oral)   Resp 14   Ht 5\' 1"  (1.549 m)   Wt 78 kg   LMP 07/05/2020   SpO2 100%   BMI 32.50 kg/m     Cervix: Dilation: 3 / Effacement (%): 90, 100 / Station: -2   Foley not able to be removed even at 3 cm.  Baseline FHR: 150 beats/min   Variability: moderate   Accelerations: present   Decelerations: absent Contractions: present frequency: 3-4 q 10 min Overall assessment: cat 1  Female chaperone present for pelvic exam:   A/P: 35 y.o. [id number] female at [redacted]w[redacted]d with IOL.  1.  Labor: start pitocin. Continue foley bulb until it comes out.   2.  FWB: reassuring, Overall assessment: category 1  3.  GBS positive - ancef  4.  Pain: epidural 5.  Recheck: 2 hours or PRN    Prentice Docker, MD, [place] 04/05/2021 6:55 PM

## 2021-04-05 NOTE — Anesthesia Procedure Notes (Signed)
Epidural Patient location during procedure: OB Start time: 04/05/2021 4:43 PM End time: 04/05/2021 4:48 PM  Staffing Anesthesiologist: Piscitello, Precious Haws, MD Resident/CRNA: Hedda Slade, CRNA Performed: anesthesiologist   Preanesthetic Checklist Completed: patient identified, IV checked, site marked, risks and benefits discussed, surgical consent, monitors and equipment checked, pre-op evaluation and timeout performed  Epidural Patient position: sitting Prep: ChloraPrep Patient monitoring: heart rate, continuous pulse ox and blood pressure Approach: midline Location: L3-L4 Injection technique: LOR saline  Needle:  Needle type: Tuohy  Needle gauge: 17 G Needle length: 9 cm and 9 Needle insertion depth: 6 cm Catheter type: closed end flexible Catheter size: 19 Gauge Catheter at skin depth: 10 cm Test dose: negative and 1.5% lidocaine with Epi 1:200 K  Assessment Sensory level: T10 Events: blood not aspirated, injection not painful, no injection resistance, no paresthesia and negative IV test  Additional Notes 1 attempt Pt. Evaluated and documentation done after procedure finished. Patient identified. Risks/Benefits/Options discussed with patient including but not limited to bleeding, infection, nerve damage, paralysis, failed block, incomplete pain control, headache, blood pressure changes, nausea, vomiting, reactions to medication both or allergic, itching and postpartum back pain. Confirmed with bedside nurse the patient's most recent platelet count. Confirmed with patient that they are not currently taking any anticoagulation, have any bleeding history or any family history of bleeding disorders. Patient expressed understanding and wished to proceed. All questions were answered. Sterile technique was used throughout the entire procedure. Please see nursing notes for vital signs. Test dose was given through epidural catheter and negative prior to continuing to dose epidural or  start infusion. Warning signs of high block given to the patient including shortness of breath, tingling/numbness in hands, complete motor block, or any concerning symptoms with instructions to call for help. Patient was given instructions on fall risk and not to get out of bed. All questions and concerns addressed with instructions to call with any issues or inadequate analgesia.    Patient tolerated the insertion well without immediate complications.Reason for block:procedure for pain

## 2021-04-05 NOTE — Anesthesia Preprocedure Evaluation (Addendum)
Anesthesia Evaluation  Patient identified by MRN, date of birth, ID band Patient awake    Reviewed: Allergy & Precautions, H&P , NPO status , Patient's Chart, lab work & pertinent test results  History of Anesthesia Complications Negative for: history of anesthetic complications  Airway Mallampati: III  TM Distance: >3 FB     Dental  (+) Chipped   Pulmonary neg sleep apnea, neg COPD,           Cardiovascular Exercise Tolerance: Good (-) hypertension(-) CAD, (-) Past MI, (-) Cardiac Stents and (-) CABG      Neuro/Psych  Headaches, neg Seizures Anxiety    GI/Hepatic Neg liver ROS, GERD  ,  Endo/Other  negative endocrine ROS  Renal/GU negative Renal ROS     Musculoskeletal negative musculoskeletal ROS (+)   Abdominal   Peds  Hematology negative hematology ROS (+)   Anesthesia Other Findings   Reproductive/Obstetrics (+) Pregnancy                            Anesthesia Physical  Anesthesia Plan  ASA: 2  Anesthesia Plan: Spinal and Epidural   Post-op Pain Management:    Induction:   PONV Risk Score and Plan: 2 and Ondansetron  Airway Management Planned: Natural Airway  Additional Equipment:   Intra-op Plan:   Post-operative Plan:   Informed Consent: I have reviewed the patients History and Physical, chart, labs and discussed the procedure including the risks, benefits and alternatives for the proposed anesthesia with the patient or authorized representative who has indicated his/her understanding and acceptance.     Dental advisory given  Plan Discussed with: CRNA and Anesthesiologist  Anesthesia Plan Comments:        Anesthesia Quick Evaluation

## 2021-04-06 ENCOUNTER — Encounter: Payer: Managed Care, Other (non HMO) | Admitting: Obstetrics and Gynecology

## 2021-04-06 ENCOUNTER — Encounter: Payer: Self-pay | Admitting: Obstetrics and Gynecology

## 2021-04-06 ENCOUNTER — Encounter
Admission: EM | Disposition: A | Discharge status: Home / Self Care | Payer: Self-pay | Source: Home / Self Care | Attending: Obstetrics and Gynecology

## 2021-04-06 DIAGNOSIS — O99891 Other specified diseases and conditions complicating pregnancy: Secondary | ICD-10-CM

## 2021-04-06 DIAGNOSIS — O99892 Other specified diseases and conditions complicating childbirth: Secondary | ICD-10-CM | POA: Diagnosis not present

## 2021-04-06 DIAGNOSIS — Z3A39 39 weeks gestation of pregnancy: Secondary | ICD-10-CM

## 2021-04-06 DIAGNOSIS — Z9889 Other specified postprocedural states: Secondary | ICD-10-CM

## 2021-04-06 SURGERY — Surgical Case
Anesthesia: Epidural

## 2021-04-06 MED ORDER — GENTAMICIN SULFATE 40 MG/ML IJ SOLN
5.0000 mg/kg | INTRAVENOUS | Status: AC
  Administered 2021-04-06: 390 mg via INTRAVENOUS
  Filled 2021-04-06 (×2): qty 9.75

## 2021-04-06 MED ORDER — BUPIVACAINE ON-Q PAIN PUMP (FOR ORDER SET NO CHG)
INJECTION | Status: DC
  Filled 2021-04-06: qty 1

## 2021-04-06 MED ORDER — METHYLERGONOVINE MALEATE 0.2 MG/ML IJ SOLN
INTRAMUSCULAR | Status: DC | PRN
  Administered 2021-04-06: .2 mg via INTRAMUSCULAR

## 2021-04-06 MED ORDER — OXYTOCIN-SODIUM CHLORIDE 30-0.9 UT/500ML-% IV SOLN: 2.5000 [IU]/h | INTRAVENOUS | Status: AC

## 2021-04-06 MED ORDER — DIBUCAINE (PERIANAL) 1 % EX OINT: 1.0000 "application " | TOPICAL_OINTMENT | CUTANEOUS | Status: DC | PRN

## 2021-04-06 MED ORDER — FENTANYL CITRATE (PF) 100 MCG/2ML IJ SOLN
INTRAMUSCULAR | Status: AC
  Filled 2021-04-06: qty 2

## 2021-04-06 MED ORDER — BUPIVACAINE HCL (PF) 0.5 % IJ SOLN
INTRAMUSCULAR | Status: DC | PRN
  Administered 2021-04-06: 10 mL

## 2021-04-06 MED ORDER — AMMONIA AROMATIC IN INHA
RESPIRATORY_TRACT | Status: AC
  Filled 2021-04-06: qty 10

## 2021-04-06 MED ORDER — BISACODYL 10 MG RE SUPP: 10.0000 mg | Freq: Every day | RECTAL | Status: DC | PRN

## 2021-04-06 MED ORDER — NALBUPHINE HCL 10 MG/ML IJ SOLN: 5.0000 mg | Freq: Once | INTRAMUSCULAR | Status: DC | PRN

## 2021-04-06 MED ORDER — FENTANYL CITRATE (PF) 100 MCG/2ML IJ SOLN
INTRAMUSCULAR | Status: DC | PRN
  Administered 2021-04-06: 20 ug via INTRATHECAL

## 2021-04-06 MED ORDER — CLINDAMYCIN PHOSPHATE 900 MG/50ML IV SOLN
INTRAVENOUS | Status: AC
  Filled 2021-04-06: qty 50

## 2021-04-06 MED ORDER — ACETAMINOPHEN 325 MG PO TABS
650.0000 mg | ORAL_TABLET | Freq: Four times a day (QID) | ORAL | Status: AC
  Administered 2021-04-06 – 2021-04-07 (×4): 650 mg via ORAL
  Filled 2021-04-06 (×4): qty 2

## 2021-04-06 MED ORDER — KETOROLAC TROMETHAMINE 30 MG/ML IJ SOLN
INTRAMUSCULAR | Status: AC
  Filled 2021-04-06: qty 1

## 2021-04-06 MED ORDER — ZOLPIDEM TARTRATE 5 MG PO TABS: 5.0000 mg | ORAL_TABLET | Freq: Every evening | ORAL | Status: DC | PRN

## 2021-04-06 MED ORDER — OXYCODONE HCL 5 MG PO TABS
5.0000 mg | ORAL_TABLET | ORAL | Status: DC | PRN
  Administered 2021-04-07: 5 mg via ORAL
  Filled 2021-04-06: qty 2
  Filled 2021-04-06 (×3): qty 1

## 2021-04-06 MED ORDER — METHYLERGONOVINE MALEATE 0.2 MG/ML IJ SOLN
INTRAMUSCULAR | Status: AC
  Filled 2021-04-06: qty 1

## 2021-04-06 MED ORDER — SOD CITRATE-CITRIC ACID 500-334 MG/5ML PO SOLN
30.0000 mL | ORAL | Status: AC
  Administered 2021-04-06: 30 mL via ORAL

## 2021-04-06 MED ORDER — SIMETHICONE 80 MG PO CHEW
80.0000 mg | CHEWABLE_TABLET | ORAL | Status: DC | PRN
  Administered 2021-04-06 – 2021-04-08 (×3): 80 mg via ORAL
  Filled 2021-04-06 (×2): qty 1

## 2021-04-06 MED ORDER — NALBUPHINE HCL 10 MG/ML IJ SOLN: 5.0000 mg | INTRAMUSCULAR | Status: DC | PRN

## 2021-04-06 MED ORDER — SODIUM CHLORIDE 0.9% FLUSH: 3.0000 mL | INTRAVENOUS | Status: DC | PRN

## 2021-04-06 MED ORDER — SODIUM CHLORIDE 0.9 % IV SOLN
INTRAVENOUS | Status: DC | PRN
  Administered 2021-04-06: 10 mL via EPIDURAL

## 2021-04-06 MED ORDER — ONDANSETRON HCL 4 MG/2ML IJ SOLN
INTRAMUSCULAR | Status: AC
  Filled 2021-04-06: qty 2

## 2021-04-06 MED ORDER — DIPHENHYDRAMINE HCL 25 MG PO CAPS: 25.0000 mg | ORAL_CAPSULE | Freq: Four times a day (QID) | ORAL | Status: DC | PRN

## 2021-04-06 MED ORDER — ONDANSETRON HCL 4 MG/2ML IJ SOLN: 4.0000 mg | Freq: Three times a day (TID) | INTRAMUSCULAR | Status: DC | PRN

## 2021-04-06 MED ORDER — DIPHENHYDRAMINE HCL 50 MG/ML IJ SOLN: 12.5000 mg | INTRAMUSCULAR | Status: DC | PRN

## 2021-04-06 MED ORDER — WITCH HAZEL-GLYCERIN EX PADS
1.0000 "application " | MEDICATED_PAD | CUTANEOUS | Status: DC | PRN
  Filled 2021-04-06: qty 100

## 2021-04-06 MED ORDER — FERROUS SULFATE 325 (65 FE) MG PO TABS
325.0000 mg | ORAL_TABLET | Freq: Two times a day (BID) | ORAL | Status: DC
  Administered 2021-04-07 – 2021-04-08 (×3): 325 mg via ORAL
  Filled 2021-04-06 (×3): qty 1

## 2021-04-06 MED ORDER — DIPHENHYDRAMINE HCL 25 MG PO CAPS: 25.0000 mg | ORAL_CAPSULE | ORAL | Status: DC | PRN

## 2021-04-06 MED ORDER — SODIUM CHLORIDE 0.9 % IV SOLN
INTRAVENOUS | Status: DC | PRN
  Administered 2021-04-06: 50 ug/min via INTRAVENOUS

## 2021-04-06 MED ORDER — HYDROMORPHONE HCL 1 MG/ML IJ SOLN: 0.2000 mg | INTRAMUSCULAR | Status: DC | PRN

## 2021-04-06 MED ORDER — BUPIVACAINE 0.25 % ON-Q PUMP DUAL CATH 400 ML
400.0000 mL | INJECTION | Status: DC
  Filled 2021-04-06: qty 400

## 2021-04-06 MED ORDER — LACTATED RINGERS IV SOLN: INTRAVENOUS | Status: DC

## 2021-04-06 MED ORDER — ONDANSETRON HCL 4 MG/2ML IJ SOLN
INTRAMUSCULAR | Status: DC | PRN
  Administered 2021-04-06: 4 mg via INTRAVENOUS

## 2021-04-06 MED ORDER — MENTHOL 3 MG MT LOZG
1.0000 | LOZENGE | OROMUCOSAL | Status: DC | PRN
  Filled 2021-04-06: qty 9

## 2021-04-06 MED ORDER — CLINDAMYCIN PHOSPHATE 900 MG/50ML IV SOLN: 900.0000 mg | INTRAVENOUS | Status: DC

## 2021-04-06 MED ORDER — IBUPROFEN 600 MG PO TABS
600.0000 mg | ORAL_TABLET | Freq: Four times a day (QID) | ORAL | Status: DC
  Administered 2021-04-07 – 2021-04-08 (×3): 600 mg via ORAL
  Filled 2021-04-06 (×3): qty 1

## 2021-04-06 MED ORDER — COCONUT OIL OIL
1.0000 "application " | TOPICAL_OIL | Status: DC | PRN
  Filled 2021-04-06 (×2): qty 120

## 2021-04-06 MED ORDER — SIMETHICONE 80 MG PO CHEW
80.0000 mg | CHEWABLE_TABLET | Freq: Three times a day (TID) | ORAL | Status: DC
  Administered 2021-04-07 – 2021-04-08 (×4): 80 mg via ORAL
  Filled 2021-04-06 (×5): qty 1

## 2021-04-06 MED ORDER — KETOROLAC TROMETHAMINE 30 MG/ML IJ SOLN: 30.0000 mg | Freq: Four times a day (QID) | INTRAMUSCULAR | Status: AC

## 2021-04-06 MED ORDER — OXYCODONE HCL 5 MG PO TABS
10.0000 mg | ORAL_TABLET | ORAL | Status: DC | PRN
  Administered 2021-04-07 (×2): 10 mg via ORAL

## 2021-04-06 MED ORDER — KETOROLAC TROMETHAMINE 30 MG/ML IJ SOLN
INTRAMUSCULAR | Status: DC | PRN
  Administered 2021-04-06: 30 mg via INTRAVENOUS

## 2021-04-06 MED ORDER — KETOROLAC TROMETHAMINE 30 MG/ML IJ SOLN
30.0000 mg | Freq: Four times a day (QID) | INTRAMUSCULAR | Status: AC
  Administered 2021-04-06 – 2021-04-07 (×3): 30 mg via INTRAVENOUS
  Filled 2021-04-06 (×3): qty 1

## 2021-04-06 MED ORDER — BUPIVACAINE HCL (PF) 0.75 % IJ SOLN
INTRAMUSCULAR | Status: DC | PRN
  Administered 2021-04-06: 1.4 mL

## 2021-04-06 MED ORDER — NALOXONE HCL 4 MG/10ML IJ SOLN
1.0000 ug/kg/h | INTRAVENOUS | Status: DC | PRN
  Filled 2021-04-06: qty 5

## 2021-04-06 MED ORDER — BUPIVACAINE HCL (PF) 0.5 % IJ SOLN
INTRAMUSCULAR | Status: AC
  Filled 2021-04-06: qty 30

## 2021-04-06 MED ORDER — FLEET ENEMA 7-19 GM/118ML RE ENEM: 1.0000 | ENEMA | Freq: Every day | RECTAL | Status: DC | PRN

## 2021-04-06 MED ORDER — OXYTOCIN-SODIUM CHLORIDE 30-0.9 UT/500ML-% IV SOLN
INTRAVENOUS | Status: DC | PRN
Start: 2021-04-06 — End: 2021-04-06
  Administered 2021-04-06: 30 [IU] via INTRAVENOUS

## 2021-04-06 MED ORDER — OXYCODONE HCL 5 MG PO TABS
5.0000 mg | ORAL_TABLET | ORAL | Status: DC | PRN
Start: 2021-04-06 — End: 2021-04-06

## 2021-04-06 MED ORDER — SENNOSIDES-DOCUSATE SODIUM 8.6-50 MG PO TABS
2.0000 | ORAL_TABLET | ORAL | Status: DC
  Administered 2021-04-06 – 2021-04-07 (×2): 2 via ORAL
  Filled 2021-04-06 (×2): qty 2

## 2021-04-06 MED ORDER — PHENYLEPHRINE HCL (PRESSORS) 10 MG/ML IV SOLN
INTRAVENOUS | Status: AC
  Filled 2021-04-06: qty 1

## 2021-04-06 MED ORDER — PRENATAL MULTIVITAMIN CH
1.0000 | ORAL_TABLET | Freq: Every day | ORAL | Status: DC
  Administered 2021-04-07: 1 via ORAL
  Filled 2021-04-06: qty 1

## 2021-04-06 MED ORDER — NALOXONE HCL 0.4 MG/ML IJ SOLN: 0.4000 mg | INTRAMUSCULAR | Status: DC | PRN

## 2021-04-06 SURGICAL SUPPLY — 30 items
BACTOSHIELD CHG 4% 4OZ (MISCELLANEOUS) ×1
CHLORAPREP W/TINT 26 (MISCELLANEOUS) ×4 IMPLANT
DERMABOND ADVANCED (GAUZE/BANDAGES/DRESSINGS) ×1
DERMABOND ADVANCED .7 DNX12 (GAUZE/BANDAGES/DRESSINGS) ×1 IMPLANT
DRESSING SURGICEL FIBRLLR 1X2 (HEMOSTASIS) ×2 IMPLANT
DRSG OPSITE POSTOP 4X10 (GAUZE/BANDAGES/DRESSINGS) ×2 IMPLANT
DRSG SURGICEL FIBRILLAR 1X2 (HEMOSTASIS) ×4
ELECT CAUTERY BLADE 6.4 (BLADE) ×2 IMPLANT
ELECT REM PT RETURN 9FT ADLT (ELECTROSURGICAL) ×2
ELECTRODE REM PT RTRN 9FT ADLT (ELECTROSURGICAL) ×1 IMPLANT
GLOVE SURG UNDER POLY LF SZ6.5 (GLOVE) ×4 IMPLANT
GOWN STRL REUS W/ TWL LRG LVL3 (GOWN DISPOSABLE) ×1 IMPLANT
GOWN STRL REUS W/ TWL XL LVL3 (GOWN DISPOSABLE) ×2 IMPLANT
GOWN STRL REUS W/TWL LRG LVL3 (GOWN DISPOSABLE) ×1
GOWN STRL REUS W/TWL XL LVL3 (GOWN DISPOSABLE) ×2
MANIFOLD NEPTUNE II (INSTRUMENTS) ×2 IMPLANT
MAT PREVALON FULL STRYKER (MISCELLANEOUS) ×2 IMPLANT
NS IRRIG 1000ML POUR BTL (IV SOLUTION) ×2 IMPLANT
PACK C SECTION AR (MISCELLANEOUS) ×2 IMPLANT
PAD OB MATERNITY 4.3X12.25 (PERSONAL CARE ITEMS) ×2 IMPLANT
PAD PREP 24X41 OB/GYN DISP (PERSONAL CARE ITEMS) ×2 IMPLANT
PENCIL SMOKE EVACUATOR (MISCELLANEOUS) ×2 IMPLANT
SCRUB CHG 4% DYNA-HEX 4OZ (MISCELLANEOUS) ×1 IMPLANT
SUT CHROMIC 0 CT 1 (SUTURE) ×2 IMPLANT
SUT MNCRL AB 4-0 PS2 18 (SUTURE) ×2 IMPLANT
SUT PLAIN 3-0 (SUTURE) ×2 IMPLANT
SUT VIC AB 0 CT1 36 (SUTURE) ×6 IMPLANT
SUT VIC AB 2-0 CT1 36 (SUTURE) ×2 IMPLANT
SUT VICRYL 0 27 CT2 27 ABS (SUTURE) ×2 IMPLANT
SYR 30ML LL (SYRINGE) ×4 IMPLANT

## 2021-04-06 NOTE — Discharge Instructions (Signed)
Discharge Instructions:   Follow-up Appointment: 1 week, please call the office to schedule  If there are any new medications, they have been ordered and will be available for pickup at the listed pharmacy on your way home from the hospital.   Call the office if you have any of the following: headache, visual changes, fever >101.0 F, chills, shortness of breath, breast concerns, excessive vaginal bleeding, incision drainage or problems, leg pain or redness, depression or any other concerns. If you have vaginal discharge with an odor, let your doctor know.   It is normal to bleed for up to 6 weeks. You should not soak through more than 1 pad in 1 hour. If you have a blood clot larger than your fist with continued bleeding, call your doctor.   After a c-section, you should expect a small amount of blood or clear fluid coming from the incision and abdominal cramping/soreness. Inspect your incision site daily. Stand in front of a mirror to look for any redness, incision opening, or discolored/odorness drainage. Take a shower daily and continue good hygiene. Use own towel and washcloth (do not share). Make sure your sheets on your bed are clean. No pets sleeping around your incision site. Dressing will be removed at your postpartum visit. If the dressing does become wet or soiled underneath, it is okay to remove it before your visit.    On-Q pump: You will remove on day 4 after insertion or if the ball becomes flat before day 4. You will remove on: 04/10/2021  Activity: Do not lift > 15 lbs for 6 weeks (do not lift anything heavier than your baby). No intercourse, tampons, swimming pools, hot tubs, baths (only showers) for 6 weeks.  No driving for 1-2 weeks. Do not drive while taking narcotic or opioid pain medication.  Continue taking your prenatal vitamin, especially if breastfeeding. Increase calories and fluids (water) while breastfeeding.   Your milk will come in, in the next couple of days (right  now it is colostrum). You may have a slight fever when your milk comes in, but it should go away on its own.  If it does not, and rises above 101 F please call the doctor. You will also feel achy and your breasts will be firm. They will also start to leak. If you are breastfeeding, continue as you have been and you can pump/express milk for comfort.   If you have too much milk, your breasts can become engorged, which could lead to mastitis. This is an infection of the milk ducts. It can be very painful and you will need to notify your doctor to obtain a prescription for antibiotics. You can also treat it with a shower or hot/cold compress.   For concerns about your baby, please call your pediatrician.  For breastfeeding concerns, the lactation consultant can be reached at [phone].   Postpartum blues (feelings of happy one minute and sad another minute) are normal for the first few weeks but if it gets worse let your doctor know.   Congratulations! We enjoyed caring for you and your new bundle of joy!

## 2021-04-06 NOTE — Op Note (Signed)
Cesarean Section Procedure Note 04/06/21  Pre-operative Diagnosis:  1. Arrest of Dilation after prior cervical conization Post-operative Diagnosis: same, delivered. Procedure: Primary Low Transverse Cesarean Section  Surgeon: Adrian Prows MD  Assistant(s): Rod Can CNM - No other skilled surgical assistant available. Anesthesia: Spinal Estimated Blood Loss: [id number] cc Complications: None; patient tolerated the procedure well.  Disposition: PACU - hemodynamically stable. Condition: stable   Findings: A female infant in the cephalic presentation. Amniotic fluid - clear  Birth weight: 7 lbs 8 oz Apgars of 8 and 9.  Intact placenta with a three-vessel cord. Grossly normal uterus, tubes and ovaries bilaterally.  No intraabdominal adhesions were noted.   Procedure Details    The patient was taken to operating room, identified as the correct patient and the procedure verified as C-Section Delivery. A time out was held and the above information confirmed. After induction of anesthesia, the patient was draped and prepped in the usual sterile manner. A Pfannenstiel incision was made and carried down through the subcutaneous tissue to the fascia. Fascial incision was made and extended transversely with the Mayo scissors. The fascia was separated from the underlying rectus tissue superiorly and inferiorly. The peritoneum was identified and entered bluntly. Peritoneal incision was extended longitudinally. A low transverse hysterotomy was made. The fetus was delivered atraumatically. The umbilical cord was clamped x2 and cut and the infant was handed to the awaiting pediatricians. The placenta was removed intact and appeared normal with a 3-vessel cord. The uterus was exteriorized and cleared of all clot and debris. The hysterotomy was closed with running sutures of 0 Vicryl suture.  A second imbricating layer was placed with the same suture. A suture was placed in the left aspect of the uterine  incision for hemostasis. The uterus was returned to the abdomen. Fibrillar was applied to both lateral aspects of the uterine incision. Excellent hemostasis was observed.   The pelvis was inspected and again, excellent hemostasis was noted. The peritoneum was closed with a running stitch of 2-0 Vicryl. The On Q Pain pump system was then placed.  Trocars were placed through the abdominal wall into the subfascial space and these were used to thread the silver soaker cathaters into place.The rectus muscles were inspected and were hemostatic. The rectus fascia was then reapproximated with running sutures of 0-vicryl, with careful placement not to incorporate the cathaters. Subcutaneous tissues are then irrigated with saline and hemostasis assured with the bovie. The subcutaneous fat was approximated with 3-0 plain and a running stitch.  The skin was closed with 4-0 monocryl suture in a subcuticular fashion followed by skin adhesive. The cathaters are flushed each with 5 mL of Bupivicaine and stabilized into place with dressing. Instrument, sponge, and needle counts were correct prior to the abdominal closure and at the conclusion of the case.  The patient tolerated the procedure well and was transferred to the recovery room in stable condition.   Rod Can assisted with this case. This was a high level case requiring a Physicist, medical. No other assistant was readily available. she assisted with retraction of tissue and application of fundal pressure during the case.    Homero Fellers MD [facility], [facility] 04/06/21 2:58 PM

## 2021-04-06 NOTE — Discharge Summary (Addendum)
Postpartum Discharge Summary  Date of Service updated: 04/08/2021     Patient Name: Alexandria Orozco DOB: 02/23/1986 MRN: [id number]  Date of admission: 04/05/2021 Delivery date:04/06/2021  Delivering provider: Adrian Prows R  Date of discharge: 04/08/2021  Admitting diagnosis: Encounter for elective induction of labor [Z34.90] Intrauterine pregnancy: [redacted]w[redacted]d    Secondary diagnosis:  Principal Problem:   Supervision of other normal pregnancy, antepartum Active Problems:   [redacted] weeks gestation of pregnancy   Encounter for elective induction of labor   Cesarean delivery delivered   Arrest of dilation, delivered, current hospitalization   Postpartum care following cesarean delivery   Encounter for care or examination of lactating mother  Additional problems: none    Discharge diagnosis: Term Pregnancy Delivered                                              Post partum procedures: none Augmentation: AROM, Pitocin, Cytotec, and IP Foley Complications: None  Hospital course: Induction of Labor With Cesarean Section   35y.o. yo [id number] 340w2das admitted to the hospital 04/05/2021 for induction of labor. Patient had a labor course significant for arrest of dilation at 4 cm. The patient went for cesarean section due to Arrest of Dilation. Delivery details are as follows: Membrane Rupture Time/Date: 5:15 AM ,04/06/2021   Delivery Method:C-Section, Low Transverse  Details of operation can be found in separate operative Note.    Patient had an uncomplicated postpartum course. She is tolerating regular diet, her pain is controlled with PO medication, she is ambulating and voiding without difficulty.  She reports breastfeeding is going well.  Patient is discharged home in stable condition on 04/08/21.      Newborn Data: Birth date:04/06/2021  Birth time:1:56 PM  Gender:Female  Living status:Living  Apgars:8 ,9  Weight:3420 g                                Magnesium Sulfate  received: No BMZ received: Yes Rhophylac:No MMR:N/A T-DaP:Given prenatally Flu: No Transfusion:No  Physical exam  Vitals:   04/07/21 1520 04/07/21 1929 04/08/21 0021 04/08/21 0755  BP: (!) 110/58 111/73 100/61 110/72  Pulse: (!) 114 79 (!) 53 60  Resp: 18 17 14 20   Temp: 99 F (37.2 C) 98.4 F (36.9 C) 97.8 F (36.6 C) 97.7 F (36.5 C)  TempSrc: Oral Oral  Oral  SpO2: 100% 100%  100%  Weight:      Height:       General: alert, cooperative, and no distress Lochia: appropriate Uterine Fundus: firm Incision: dressing is c/d/i, on q intact DVT Evaluation: No evidence of DVT seen on physical exam. Labs: Lab Results  Component Value Date   WBC 15.0 (H) 04/07/2021   HGB 8.3 (L) 04/07/2021   HCT 24.7 (L) 04/07/2021   MCV 92.5 04/07/2021   PLT 171 04/07/2021   CMP Latest Ref Rng & Units 02/27/2021  Glucose 70 - 99 mg/dL 84  BUN 6 - 20 mg/dL 10  Creatinine 0.44 - 1.00 mg/dL 0.53  Sodium 135 - 145 mmol/L 132(L)  Potassium 3.5 - 5.1 mmol/L 3.7  Chloride 98 - 111 mmol/L 104  CO2 22 - 32 mmol/L 20(L)  Calcium 8.9 - 10.3 mg/dL 8.3(L)  Total Protein 6.5 - 8.1 g/dL 6.9  Total Bilirubin 0.3 - 1.2 mg/dL 0.6  Alkaline Phos 38 - 126 U/L 128(H)  AST 15 - 41 U/L 17  ALT 0 - 44 U/L 13   Edinburgh Score: Edinburgh Postnatal Depression Scale Screening Tool 04/07/2021  I have been able to laugh and see the funny side of things. 0  I have looked forward with enjoyment to things. 0  I have blamed myself unnecessarily when things went wrong. 0  I have been anxious or worried for no good reason. 0  I have felt scared or panicky for no good reason. 0  Things have been getting on top of me. 0  I have been so unhappy that I have had difficulty sleeping. 0  I have felt sad or miserable. 0  I have been so unhappy that I have been crying. 0  The thought of harming myself has occurred to me. 0  Edinburgh Postnatal Depression Scale Total 0      After visit meds:  Allergies as of  04/08/2021       Reactions   Ampicillin Rash   Did it involve swelling of the face/tongue/throat, SOB, or low BP? No Did it involve sudden or severe rash/hives, skin peeling, or any reaction on the inside of your mouth or nose? No Did you need to seek medical attention at a hospital or doctor's office? No When did it last happen?      3-4 years ago If all above answers are "NO", may proceed with cephalosporin use.   Morphine And Related Itching, Rash   Penicillins Rash   Did it involve swelling of the face/tongue/throat, SOB, or low BP? No Did it involve sudden or severe rash/hives, skin peeling, or any reaction on the inside of your mouth or nose? No Did you need to seek medical attention at a hospital or doctor's office? No When did it last happen?      3-4 years ago If all above answers are "NO", may proceed with cephalosporin use.   Sulfa Antibiotics Rash        Medication List     TAKE these medications    oxyCODONE 5 MG immediate release tablet Commonly known as: Oxy IR/ROXICODONE Take 1 tablet (5 mg total) by mouth every 6 (six) hours as needed for up to 5 days for moderate pain or severe pain.   prenatal multivitamin Tabs tablet Take 1 tablet by mouth daily at 12 noon.               Discharge Care Instructions  (From admission, onward)           Start     Ordered   04/08/21 0000  Discharge wound care:       Comments: Keep incision dry, clean.   04/08/21 0941             Discharge home in stable condition Infant Feeding: Breast Infant Disposition:home with mother Discharge instruction: per After Visit Summary and Postpartum booklet. Activity: Advance as tolerated. Pelvic rest for 6 weeks.  Diet: routine diet Anticipated Birth Control:  considering mini pill or mirena iud Postpartum Appointment:1 week Additional Postpartum F/U:  6 week follow up Future Appointments: No future appointments.  Follow up Visit:  Follow-up Information      Schuman, Christanna R, MD Follow up in 1 week(s).   Specialty: Obstetrics and Gynecology Why: For incision check Contact information: [address]. [place] [address] [phone]  04/08/2021 Rod Can, CNM

## 2021-04-06 NOTE — Progress Notes (Signed)
Labor Check  Subj:  Complaints: comfortable with epidural   Obj:  BP 110/71   Pulse 67   Temp 98.2 F (36.8 C) (Oral)   Resp 14   Ht '5\' 1"'$  (1.549 m)   Wt 78 kg   LMP 07/05/2020   SpO2 99%   BMI 32.50 kg/m  Dose (milli-units/min) Oxytocin: 14 milli-units/min  Cervix: Dilation: 4.5 / Effacement (%): 90 / Station: -2  Baseline FHR: 150 beats/min   Variability: moderate   Accelerations: present   Decelerations: absent Contractions: present frequency: 4-5 q 10 (MVUs ~200-220) Overall assessment: cat 1  Female chaperone present for pelvic exam:   A/P: 35 y.o. [id number] female at 53w2dwith IOL, s/p CKC.  1.  Labor: continue pit per protocol  2.  FWB: reassuring, Overall assessment: category 1  3.  GBS pos - ancef q 8  4.  Pain: epidural - well controlled 5.  Recheck: 2hrs/prn    SPrentice Docker MD, [facility], [facility] 04/06/2021 7:05 AM

## 2021-04-06 NOTE — Progress Notes (Signed)
Labor Check  Subj:  Complaints: comfortable with epidural   Obj:  BP 110/71   Pulse 67   Temp 98.2 F (36.8 C) (Oral)   Resp 14   Ht '5\' 1"'$  (1.549 m)   Wt 78 kg   LMP 07/05/2020   SpO2 99%   BMI 32.50 kg/m  Dose (milli-units/min) Oxytocin: 13 milli-units/min  Cervix: Dilation: 4 / Effacement (%): 90 / Station: -2   Cervix still with band of scar tissue.  Attempted to digitally massage and gently stretch the tissue.  Utilized a  ring forceps to obtain a little more tension. In doing so, AROM occurred with return of clear fluid.  There is still a small band, especially posteriorly, that palpates somewhat softer.    IUPC placed without difficulty.  Baseline FHR: 150 beats/min   Variability: moderate   Accelerations: present   Decelerations: absent Contractions: present frequency: 4 q 10 min Overall assessment: cat 1  Female chaperone present for pelvic exam:   A/P: 35 y.o. [id number] female at 50w2dwith IOL, history of CKC.  1.  Labor: titrate pitocin until adequate  2.  FWB: reassuring, Overall assessment: category 1  3.  GBS positive - ancef  4.  Pain: epidural 5.  Recheck: 2 hours/prn    SPrentice Docker MD, [facility], [facility] 04/06/2021 5:54 AM

## 2021-04-06 NOTE — Lactation Note (Signed)
This note was copied from a baby's chart. Lactation Consultation Note  Patient Name: Alexandria Orozco Today's Date: 04/06/2021   Age:33 hours  Maternal Data  Low supply with other children, had to supp formula  Feeding  I did not observe a feeding, baby had breastfed at 36 for 30 min on left breast, mom stated baby latched easily and nursed well, she heard swallows  LATCH Score                    Lactation Tools Discussed/Used    Interventions    Discharge    Consult Status  Followup 04-07-2021    Ferol Luz 04/06/2021, 8:04 PM

## 2021-04-06 NOTE — Transfer of Care (Signed)
Immediate Anesthesia Transfer of Care Note  Patient: Alexandria Orozco  Procedure(s) Performed: CESAREAN SECTION  Patient Location: LDR 3  Anesthesia Type:spinal  Level of Consciousness: awake, alert  and oriented  Airway & Oxygen Therapy: Patient Spontanous Breathing  Post-op Assessment: Report given to RN and Post -op Vital signs reviewed and stable  Post vital signs: Reviewed and stable  Last Vitals:  Vitals Value Taken Time  BP 118/82 04/06/21 1508  Temp    Pulse 47 04/06/21 1508  Resp 16 04/06/21 1508  SpO2 97 % 04/06/21 1508  Vitals shown include unvalidated device data.  Last Pain:  Vitals:   04/06/21 1508  TempSrc:   PainSc: 0-No pain      Patients Stated Pain Goal: 0 ([id number])  Complications: No notable events documented.

## 2021-04-06 NOTE — Progress Notes (Signed)
  Labor Progress Note   35 y.o. year old [id number] at 40w2dweeks gestation admitted for induction of labor  Subjective:  She is upset and frustrated with her situation. This labor has been different than her prior labors which were fast. She has only made cervical advancement with manipulation. She feels frustrated that Dr. JGlennon Macis now off call because he worked more with her cervix. She has not been able to be comfortable with her epidural and is experiencing pain. She has already had her epidural replaced and bolused. It is her son's birthday today and she wanted to be home with him. She does not want to labor all day in bed without having a baby, but also does not want to have a cesarean section.  Objective:   Vitals:   04/06/21 1141 04/06/21 1146  BP:    Pulse:    Resp:    Temp:    SpO2: 100% 100%    Abd: gravid Extr: no edema SVE: 4/90/-3  EFM: 150 bpm baseline, moderate variability, 15x15 accelerations, no decelerations. Toco: every 2-3 minutes   Assessment & Plan:  35y.o. year old [id number] 343w2deeks gestation admitted for elective induction Pregnancy and Labor/Delivery Management  1. Pain management: inadequate with epidural 2. FWB: FHT category 1 3. ID: GBS Positive 4. Labor management: I discussed options with the patient regarding her labor management. Since she has had a cervical conization there is likely scar tissue that is limiting her cervical change.  We discussed that since her fetal heart tracing is reassuring she could continue to labor and see if with time her cervix makes change.  I offered that we could discontinue Pitocin and trial Cytotec. I also offered hourly vaginal exams with massage of the cervix to see if the scar tissue will break down any further.  I also offered cesarean section for arrest of dilation. She will consider he options and decide how she wants to proceed.  All discussed with patient, see orders  ChAdrian ProwsD [facility] 04/06/2021 12:15 PM

## 2021-04-06 NOTE — Anesthesia Procedure Notes (Signed)
Epidural Patient location during procedure: OB Start time: 04/06/2021 9:42 AM End time: 04/06/2021 9:45 AM  Staffing Anesthesiologist: Emmie Niemann, MD Resident/CRNA: Hedda Slade, CRNA Performed: resident/CRNA   Preanesthetic Checklist Completed: patient identified, IV checked, site marked, risks and benefits discussed, surgical consent, monitors and equipment checked, pre-op evaluation and timeout performed  Epidural Patient position: sitting Prep: ChloraPrep Patient monitoring: heart rate, continuous pulse ox and blood pressure Approach: midline Location: L3-L4 Injection technique: LOR air  Needle:  Needle type: Tuohy  Needle gauge: 17 G Needle length: 9 cm and 9 Needle insertion depth: 6 cm Catheter type: closed end flexible Catheter size: 19 Gauge Catheter at skin depth: 11 cm Test dose: negative and 1.5% lidocaine with Epi 1:200 K  Assessment Events: blood not aspirated, injection not painful, no injection resistance, no paresthesia and negative IV test  Additional Notes 1 attempt Pt. Evaluated and documentation done after procedure finished. Patient identified. Risks/Benefits/Options discussed with patient including but not limited to bleeding, infection, nerve damage, paralysis, failed block, incomplete pain control, headache, blood pressure changes, nausea, vomiting, reactions to medication both or allergic, itching and postpartum back pain. Confirmed with bedside nurse the patient's most recent platelet count. Confirmed with patient that they are not currently taking any anticoagulation, have any bleeding history or any family history of bleeding disorders. Patient expressed understanding and wished to proceed. All questions were answered. Sterile technique was used throughout the entire procedure. Please see nursing notes for vital signs. Test dose was given through epidural catheter and negative prior to continuing to dose epidural or start infusion. Warning signs of  high block given to the patient including shortness of breath, tingling/numbness in hands, complete motor block, or any concerning symptoms with instructions to call for help. Patient was given instructions on fall risk and not to get out of bed. All questions and concerns addressed with instructions to call with any issues or inadequate analgesia.    Patient tolerated the insertion well without immediate complications.Reason for block:procedure for pain

## 2021-04-07 LAB — CBC
HCT: 24.7 % — ABNORMAL LOW (ref 36.0–46.0)
Hemoglobin: 8.3 g/dL — ABNORMAL LOW (ref 12.0–15.0)
MCH: 31.1 pg (ref 26.0–34.0)
MCHC: 33.6 g/dL (ref 30.0–36.0)
MCV: 92.5 fL (ref 80.0–100.0)
Platelets: 171 10*3/uL (ref 150–400)
RBC: 2.67 MIL/uL — ABNORMAL LOW (ref 3.87–5.11)
RDW: 13.1 % (ref 11.5–15.5)
WBC: 15 10*3/uL — ABNORMAL HIGH (ref 4.0–10.5)
nRBC: 0 % (ref 0.0–0.2)

## 2021-04-07 NOTE — Progress Notes (Addendum)
Obstetric Postpartum/PostOperative Daily Progress Note Subjective:  35 y.o. [id number] post-operative day # 1 status post primary cesarean delivery.  She is ambulating, is tolerating po, is voiding spontaneously.  Her pain is well controlled on PO pain medications. Her lochia is less than menses. She reports breastfeeding is going well.   Medications SCHEDULED MEDICATIONS   acetaminophen  650 mg Oral Q6H   ferrous sulfate  325 mg Oral BID WC   ibuprofen  600 mg Oral Q6H   ketorolac  30 mg Intravenous Q6H   Or   ketorolac  30 mg Intramuscular Q6H   prenatal multivitamin  1 tablet Oral Q1200   senna-docusate  2 tablet Oral Q24H   simethicone  80 mg Oral TID PC    MEDICATION INFUSIONS   bupivacaine ON-Q pain pump     lactated ringers 125 mL/hr at 04/07/21 0352   naLOXone [facility]: [facility] Rhonda W) adult infusion for PRURITIS     oxytocin Stopped (04/07/21 0352)    PRN MEDICATIONS  bisacodyl, coconut oil, witch hazel-glycerin **AND** dibucaine, diphenhydrAMINE **OR** diphenhydrAMINE, diphenhydrAMINE, HYDROmorphone (DILAUDID) injection, menthol-cetylpyridinium, nalbuphine **OR** nalbuphine, nalbuphine **OR** nalbuphine, naloxone **AND** sodium chloride flush, naLOXone (NARCAN) adult infusion for PRURITIS, ondansetron (ZOFRAN) IV, [DISCONTINUED] oxyCODONE **OR** oxyCODONE, oxyCODONE, simethicone, sodium phosphate, zolpidem    Objective:   Vitals:   04/06/21 1945 04/07/21 0034 04/07/21 0402 04/07/21 0757  BP: 118/70 105/67 (!) 95/50 101/68  Pulse: 61 63 62 60  Resp: '20 20 20 18  '$ Temp: 99.2 F (37.3 C) 98.4 F (36.9 C) 98.5 F (36.9 C) 98 F (36.7 C)  TempSrc: Oral Oral Oral Oral  SpO2: 99% 98% 98% 99%  Weight:      Height:        Current Vital Signs 24h Vital Sign Ranges  T 98 F (36.7 C) Temp  Avg: 98.5 F (36.9 C)  Min: 97.6 F (36.4 C)  Max: 99.2 F (37.3 C)  BP 101/68 BP  Min: 95/50  Max: 129/83  HR 60 Pulse  Avg: 60.8  Min: 45  Max: 74  RR 18 Resp  Avg: 16.9  Min: 12  Max: 26  SaO2 99 %   (room air) SpO2  Avg: 99.4 %  Min: 93 %  Max: 100 %       24 Hour I/O Current Shift I/O  Time Ins Outs 07/22 0701 - 07/23 0700 In: 682.8 [I.V.:682.8] Out: 3350 [Urine:2225] No intake/output data recorded.   General: NAD Pulmonary: no increased work of breathing Abdomen: non-distended, non-tender, fundus firm at level of umbilicus Inc: Clean/dry/intact, on q intact Extremities: no edema, no erythema, no tenderness  Labs:  Recent Labs  Lab 04/05/21 0627 04/07/21 0452  WBC 8.1 15.0*  HGB 10.6* 8.3*  HCT 31.6* 24.7*  PLT 215 171     Assessment:   35 y.o. [id number] postoperative day # 1 status post primary cesarean section, lactating  Plan:  1) Acute blood loss anemia - hemodynamically stable and asymptomatic - po ferrous sulfate  2) O POS / Rubella 8.13 (12/09 1556)/ Varicella Immune  3) TDAP status given antepartum  4) Breastfeeding/Contraception =  Mini pill or Mirena IUD  5) Disposition: continue current care   Rod Can, CNM 04/07/2021 8:56 AM

## 2021-04-08 MED ORDER — OXYCODONE HCL 5 MG PO TABS: 5.0000 mg | ORAL_TABLET | Freq: Four times a day (QID) | ORAL | 0 refills | Status: AC | PRN

## 2021-04-08 NOTE — Progress Notes (Signed)
Patient discharged with infant. Discharge instructions, prescriptions, and follow up appointments given to and reviewed with patient. Patient verbalized understanding. Will be escorted out by axillary.  °

## 2021-04-08 NOTE — Anesthesia Post-op Follow-up Note (Signed)
  Anesthesia Pain Follow-up Note  Patient: Alexandria Orozco  Day #: 2  Date of Follow-up: 04/08/2021 Time: 9:02 AM  Last Vitals:  Vitals:   04/08/21 0021 04/08/21 0755  BP: 100/61 110/72  Pulse: (!) 53 60  Resp: 14 20  Temp: 36.6 C 36.5 C  SpO2:  100%    Level of Consciousness: alert  Pain: mild   Side Effects:None  Catheter Site Exam:clean     Plan: D/C from anesthesia care at surgeon's request  Milinda Pointer

## 2021-04-08 NOTE — Anesthesia Postprocedure Evaluation (Signed)
Anesthesia Post Note  Patient: Alexandria Orozco  Procedure(s) Performed: [place]  Patient location during evaluation: Mother Baby Anesthesia Type: Spinal Level of consciousness: awake Pain management: pain level controlled Vital Signs Assessment: post-procedure vital signs reviewed and stable Respiratory status: spontaneous breathing Cardiovascular status: blood pressure returned to baseline Postop Assessment: no headache and no backache Anesthetic complications: no   No notable events documented.   Last Vitals:  Vitals:   04/08/21 0021 04/08/21 0755  BP: 100/61 110/72  Pulse: (!) 53 60  Resp: 14 20  Temp: 36.6 C 36.5 C  SpO2:  100%    Last Pain:  Vitals:   04/08/21 0755  TempSrc: Oral  PainSc:                  Milinda Pointer

## 2021-04-09 ENCOUNTER — Encounter: Payer: Self-pay | Admitting: Obstetrics and Gynecology

## 2021-04-09 ENCOUNTER — Telehealth: Payer: Self-pay

## 2021-04-09 NOTE — Telephone Encounter (Signed)
FMLA/DISABILITY form for [employer] filled out.  Will get signature, fax and process.

## 2021-04-11 ENCOUNTER — Ambulatory Visit (INDEPENDENT_AMBULATORY_CARE_PROVIDER_SITE_OTHER): Payer: Managed Care, Other (non HMO) | Admitting: Obstetrics and Gynecology

## 2021-04-11 ENCOUNTER — Other Ambulatory Visit: Payer: Self-pay

## 2021-04-11 ENCOUNTER — Ambulatory Visit: Payer: Managed Care, Other (non HMO) | Admitting: Obstetrics and Gynecology

## 2021-04-11 ENCOUNTER — Encounter: Payer: Self-pay | Admitting: Obstetrics and Gynecology

## 2021-04-11 DIAGNOSIS — Z98891 History of uterine scar from previous surgery: Secondary | ICD-10-CM

## 2021-04-11 NOTE — Progress Notes (Signed)
Postpartum Visit  Chief Complaint:  Chief Complaint  Patient presents with   Gynecologic Exam    History of Present Illness: Patient is a 35 y.o. Alexandria Orozco presents for postpartum visit.  Date of delivery: 04/06/2021 Type of delivery: Cesarean Section  Pregnancy or labor problems: Arrest of dilation  Lochia: light Post partum depression/anxiety noted:  no Edinburgh Post-Partum Depression Score:   0   Date of last PAP: 2021 NIL  Any problems since the delivery:  no  Newborn Details:  SINGLETON :  1. Baby Gender:female.  Infant Status: Infant doing well at home with mother.  Review of Systems: Review of Systems  Constitutional:  Negative for chills, fever, malaise/fatigue and weight loss.  HENT:  Negative for congestion, hearing loss and sinus pain.   Eyes:  Negative for blurred vision and double vision.  Respiratory:  Negative for cough, sputum production, shortness of breath and wheezing.   Cardiovascular:  Negative for chest pain, palpitations, orthopnea and leg swelling.  Gastrointestinal:  Negative for abdominal pain, constipation, diarrhea, nausea and vomiting.  Genitourinary:  Negative for dysuria, flank pain, frequency, hematuria and urgency.  Musculoskeletal:  Negative for back pain, falls and joint pain.  Skin:  Negative for itching and rash.  Neurological:  Negative for dizziness and headaches.  Psychiatric/Behavioral:  Negative for depression, substance abuse and suicidal ideas. The patient is not nervous/anxious.     Past Medical History:  Past Medical History:  Diagnosis Date   Anxiety    Cancer ([place])    GERD (gastroesophageal reflux disease)    WITH PREGNANCY ONLY   History of methicillin resistant staphylococcus aureus (MRSA) 2018   Migraines    MIGRAINES   S/P conization of cervix 11/23/2018   S/P LEEP 12/14/2018    Past Surgical History:  Past Surgical History:  Procedure Laterality Date   CERVICAL CONIZATION W/BX N/A 11/23/2018   Procedure: COLD  KNIFE CONIZATION CERVIX WITH BIOPSY;  Surgeon: Rubie Maid, MD;  Location: [facility];  Service: Gynecology;  Laterality: N/A;   CESAREAN SECTION  04/06/2021   Procedure: CESAREAN SECTION;  Surgeon: Homero Fellers, MD;  Location: [facility];  Service: Obstetrics;;   CHOLECYSTECTOMY     ESOPHAGOGASTRODUODENOSCOPY (EGD) WITH PROPOFOL N/A 08/06/2017   Procedure: ESOPHAGOGASTRODUODENOSCOPY (EGD) WITH PROPOFOL;  Surgeon: Lucilla Lame, MD;  Location: [place];  Service: Endoscopy;  Laterality: N/A;   TONSILLECTOMY      Family History:  Family History  Problem Relation Age of Onset   Diabetes Mother    Heart attack Father    Diabetes Paternal Uncle    Diabetes Maternal Grandmother    Pancreatitis Maternal Grandmother    Breast cancer Neg Hx    Ovarian cancer Neg Hx    Colon cancer Neg Hx     Social History:  Social History   Socioeconomic History   Marital status: Married    Spouse name: Not on file   Number of children: Not on file   Years of education: Not on file   Highest education level: Not on file  Occupational History   Not on file  Tobacco Use   Smoking status: Never   Smokeless tobacco: Never  Vaping Use   Vaping Use: Never used  Substance and Sexual Activity   Alcohol use: No   Drug use: No   Sexual activity: Yes    Birth control/protection: None    Comment: undecided  Other Topics Concern   Not on file  Social History Narrative  Lives at home, independent   Social Determinants of Health   Financial Resource Strain: Not on file  Food Insecurity: Not on file  Transportation Needs: Not on file  Physical Activity: Not on file  Stress: Not on file  Social Connections: Not on file  Intimate Partner Violence: Not on file    Allergies:  Allergies  Allergen Reactions   Ampicillin Rash    Did it involve swelling of the face/tongue/throat, SOB, or low BP? No Did it involve sudden or severe rash/hives, skin peeling, or any reaction on the  inside of your mouth or nose? No Did you need to seek medical attention at a hospital or doctor's office? No When did it last happen?      3-4 years ago If all above answers are "NO", may proceed with cephalosporin use.   Morphine And Related Itching and Rash   Penicillins Rash    Did it involve swelling of the face/tongue/throat, SOB, or low BP? No Did it involve sudden or severe rash/hives, skin peeling, or any reaction on the inside of your mouth or nose? No Did you need to seek medical attention at a hospital or doctor's office? No When did it last happen?      3-4 years ago If all above answers are "NO", may proceed with cephalosporin use.    Sulfa Antibiotics Rash    Medications: Prior to Admission medications   Medication Sig Start Date End Date Taking? Authorizing Provider  oxyCODONE (OXY IR/ROXICODONE) 5 MG immediate release tablet Take 1 tablet (5 mg total) by mouth every 6 (six) hours as needed for up to 5 days for moderate pain or severe pain. 04/08/21 04/13/21 Yes Rod Can, CNM  Prenatal Vit-Fe Fumarate-FA (PRENATAL MULTIVITAMIN) TABS tablet Take 1 tablet by mouth daily at 12 noon.   Yes [provider]    Physical Exam Vitals:  Vitals:   04/11/21 1109  BP: 128/72    Physical Exam Constitutional:      Appearance: Normal appearance. She is well-developed.  HENT:     Head: Normocephalic and atraumatic.  Eyes:     Extraocular Movements: Extraocular movements intact.     Pupils: Pupils are equal, round, and reactive to light.  Neck:     Thyroid: No thyromegaly.  Cardiovascular:     Rate and Rhythm: Normal rate and regular rhythm.     Heart sounds: Normal heart sounds.  Pulmonary:     Effort: Pulmonary effort is normal.     Breath sounds: Normal breath sounds.  Abdominal:     General: Bowel sounds are normal. There is no distension.     Palpations: Abdomen is soft. There is no mass.     Comments: Incisions is clean, dry, and intact. Healing well   Musculoskeletal:     Cervical back: Neck supple.  Neurological:     Mental Status: She is alert and oriented to person, place, and time.  Skin:    General: Skin is warm and dry.  Psychiatric:        Behavior: Behavior normal.        Thought Content: Thought content normal.        Judgment: Judgment normal.  Vitals reviewed.    Assessment: 35 y.o. Alexandria Orozco presenting for 6 week postpartum visit  Plan: Problem List Items Addressed This Visit       Other   Postpartum care following cesarean delivery - Primary   Other Visit Diagnoses     S/P cesarean section  1) Contraception-  not discussed, per discharge summary: considering mini pill or mirena iud  2)  Pap: ASCCP guidelines and rational discussed.    3) Patient underwent screening for postpartum depression with no concerns noted  - Follow up in 4 weeks  Adrian Prows MD, [place], [facility] 04/11/2021 11:29 AM

## 2021-04-12 ENCOUNTER — Ambulatory Visit: Payer: Managed Care, Other (non HMO) | Admitting: Obstetrics and Gynecology

## 2021-04-16 NOTE — H&P (Signed)
OB History & Physical   History of Present Illness:  Chief Complaint: Elective induction of labor  HPI:  Alexandria Orozco is a 35 y.o. [phone] female at 71w2ddated by LMP consistent with a 7 week ultrasound.  Her pregnancy has been complicated by history of preterm delivery x 2, G1 at 35 weeks, G2 at 39 weeks, G3 at 35 weeks .    She denies vaginal bleeding, leaking fluid, and contractions. SHe notes +FM  Total weight gain for pregnancy: 10.9 kg   Obstetrical Problem List: pregnancy5 Problems (from 07/05/20 to 04/08/21)     Problem Noted Resolved   Postpartum care following cesarean delivery 04/08/2021 by GRod Can CNM No   Encounter for care or examination of lactating mother 04/08/2021 by GRod Can CNM No   Supervision of other normal pregnancy, antepartum 08/24/2020 by VOrlie Pollen CNM No   Overview Addendum 03/20/2021  2:46 PM by FImagene Riches CKelsoPrenatal Labs  Dating LMP = 730w1dlood type: O pos  Genetic Screen NIPS: normal XX Antibody: negative  Anatomic US[place]ormal Rubella: Immune Varicella: Immune  GTT  Third trimester: 91 RPR: NR  Rhogam N/A HBsAg: negative  TDaP vaccine                        Flu Shot: declines HIV: negative  Baby Food   breast                              GBS: POSITIVE- clindamycin resistant- use Vancomycin.  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, deep margins clear, 08/24/2020 NILM  CBB     CS/VBAC N/A   Support Person Alexandria Orozco  Hx PTL, single dose Betamethasone 02/19/2021      Nausea and vomiting during pregnancy 08/24/2020 by VeOrlie PollenCNM No   Headache in pregnancy, antepartum, first trimester 08/24/2020 by VeOrlie PollenCNM No   [redacted] weeks gestation of pregnancy 03/06/2021 by GlRod CanCNM 04/01/2021 by StMalachy MoodMD        Maternal Medical History:   Past Medical History:  Diagnosis Date   Anxiety    [facility]   GERD (gastroesophageal reflux disease)    WITH PREGNANCY ONLY   History of methicillin resistant staphylococcus aureus (MRSA) 2018   Migraines    MIGRAINES   S/P conization of cervix 11/23/2018   S/P LEEP 12/14/2018    Past Surgical History:  Procedure Laterality Date   CERVICAL CONIZATION W/BX N/A 11/23/2018   Procedure: COLD KNIFE CONIZATION CERVIX WITH BIOPSY;  Surgeon: ChRubie MaidMD;  Location: [facility];  Service: Gynecology;  Laterality: N/A;   CESAREAN SECTION  04/06/2021   Procedure: CESAREAN SECTION;  Surgeon: ScHomero FellersMD;  Location: [facility];  Service: Obstetrics;;   CHOLECYSTECTOMY     ESOPHAGOGASTRODUODENOSCOPY (EGD) WITH PROPOFOL N/A 08/06/2017   Procedure: ESOPHAGOGASTRODUODENOSCOPY (EGD) WITH PROPOFOL;  Surgeon: WoLucilla LameMD;  [place] Service: [place];  Laterality: N/A;   TONSILLECTOMY      Allergies  Allergen Reactions   Ampicillin Rash    Did it involve swelling of the face/tongue/throat, SOB, or low BP? No Did it involve sudden or severe rash/hives, skin peeling, or any reaction on the inside of your mouth or nose? No Did you need to seek medical  attention at a hospital or doctor's office? No When did it last happen?      3-4 years ago If all above answers are "NO", may proceed with cephalosporin use.   Morphine And Related Itching and Rash   Penicillins Rash    Did it involve swelling of the face/tongue/throat, SOB, or low BP? No Did it involve sudden or severe rash/hives, skin peeling, or any reaction on the inside of your mouth or nose? No Did you need to seek medical attention at a hospital or doctor's office? No When did it last happen?      3-4 years ago If all above answers are "NO", may proceed with cephalosporin use.    Sulfa Antibiotics Rash    Prior to Admission medications   Medication Sig Start Date End Date Taking? Authorizing Provider  Prenatal Vit-Fe Fumarate-FA (PRENATAL MULTIVITAMIN) TABS  tablet Take 1 tablet by mouth daily at 12 noon.    [provider]    OB History  Gravida Para Term Preterm AB Living  '5 4 2 2 1 4  '$ SAB IAB Ectopic Multiple Live Births  1     0 4    # Outcome Date GA Lbr Len/2nd Weight Sex Delivery Anes PTL Lv  5 Term 04/06/21 [redacted]w[redacted]d 3420 g F CS-LTranv EPI, Spinal  LIV  4 SAB 04/06/19          3 Preterm 04/06/18 358w0d 00:05 2940 g M Vag-Spont EPI  LIV  2 Term 08/08/16   2977 g F Vag-Spont EPI N LIV  1 Preterm 07/10/07   2523 g M Vag-Spont EPI Y LIV    Prenatal care site: [facility]  Social History: She  reports that she has never smoked. She has never used smokeless tobacco. She reports that she does not drink alcohol and does not use drugs.  Family History: family history includes Diabetes in her maternal grandmother, mother, and paternal uncle; Heart attack in her father; Pancreatitis in her maternal grandmother.   Review of Systems:  Review of Systems  Constitutional: Negative.   HENT: Negative.    Eyes: Negative.   Respiratory: Negative.    Cardiovascular: Negative.   Gastrointestinal: Negative.   Genitourinary: Negative.   Musculoskeletal: Negative.   Skin: Negative.   Neurological: Negative.   Psychiatric/Behavioral: Negative.      Physical Exam:  BP 110/72 (BP Location: Right Arm)   Pulse 60   Temp 97.7 F (36.5 C) (Oral)   Resp 20   Ht '5\' 1"'$  (1.549 m)   Wt 78 kg   LMP 07/05/2020   SpO2 100%   Breastfeeding Unknown   BMI 32.50 kg/m   Physical Exam Constitutional:      General: She is not in acute distress.    Appearance: Normal appearance. She is well-developed.  HENT:     Head: Normocephalic and atraumatic.  Eyes:     General: No scleral icterus.    Conjunctiva/sclera: Conjunctivae normal.  Cardiovascular:     Rate and Rhythm: Normal rate and regular rhythm.     Heart sounds: No murmur heard.   No friction rub. No gallop.  Pulmonary:     Effort: Pulmonary effort is normal. No respiratory  distress.     Breath sounds: Normal breath sounds. No wheezing or rales.  Abdominal:     General: Bowel sounds are normal. There is no distension.     Palpations: Abdomen is soft. There is mass (gravid uterus).     Tenderness:  There is no abdominal tenderness. There is no guarding or rebound.  Musculoskeletal:        General: Normal range of motion.     Cervical back: Normal range of motion and neck supple.  Neurological:     General: No focal deficit present.     Mental Status: She is alert and oriented to person, place, and time.     Cranial Nerves: No cranial nerve deficit.  Skin:    General: Skin is warm and dry.     Findings: No erythema.  Psychiatric:        Mood and Affect: Mood normal.        Behavior: Behavior normal.        Judgment: Judgment normal.      Lab Results  Component Value Date   [place] NEGATIVE 04/02/2021  ]  Assessment:  JANEEKA PUHL is a 35 y.o. [phone] female at 32w2dwith elective IOL.   Plan:  Admit to Labor & Delivery  CBC, T&S, Clrs, IVF GBS positive - ancef per protocol.   Fetwal well-being: reassuring    SPrentice Docker MD 04/16/2021 1:19 PM

## 2021-04-19 ENCOUNTER — Telehealth: Payer: Self-pay

## 2021-04-19 NOTE — Telephone Encounter (Signed)
Pt calling; her FMLA paperwork should say her leave begins 03/20/21 not 04/06/21; please amend and fax back.  [phone]  spoke c pt; adv recv'd more paperwork on her which did say 03/20/21 and it has been faxed.

## 2021-05-07 NOTE — Telephone Encounter (Signed)
Patient is scheduled  for 05/17/21 with CRS

## 2021-05-17 ENCOUNTER — Encounter: Payer: Self-pay | Admitting: Obstetrics and Gynecology

## 2021-05-17 ENCOUNTER — Ambulatory Visit (INDEPENDENT_AMBULATORY_CARE_PROVIDER_SITE_OTHER): Payer: Managed Care, Other (non HMO) | Admitting: Obstetrics and Gynecology

## 2021-05-17 ENCOUNTER — Other Ambulatory Visit: Payer: Self-pay

## 2021-05-17 DIAGNOSIS — F53 Postpartum depression: Secondary | ICD-10-CM

## 2021-05-17 DIAGNOSIS — Z98891 History of uterine scar from previous surgery: Secondary | ICD-10-CM

## 2021-05-17 DIAGNOSIS — O99345 Other mental disorders complicating the puerperium: Secondary | ICD-10-CM

## 2021-05-17 DIAGNOSIS — Z30011 Encounter for initial prescription of contraceptive pills: Secondary | ICD-10-CM

## 2021-05-17 MED ORDER — SERTRALINE HCL 50 MG PO TABS: 50.0000 mg | ORAL_TABLET | Freq: Every day | ORAL | 3 refills | Status: DC

## 2021-05-17 MED ORDER — NORETHIN ACE-ETH ESTRAD-FE 1-20 MG-MCG PO TABS: 1.0000 | ORAL_TABLET | Freq: Every day | ORAL | 11 refills | Status: DC

## 2021-05-17 NOTE — Progress Notes (Signed)
Postpartum Visit  Chief Complaint:  Chief Complaint  Patient presents with   Postpartum Care    History of Present Illness: Patient is a 35 y.o. [id number] presents for postpartum visit.  Date of delivery: 04/06/2021 Type of delivery: Cesarean section  Pregnancy or labor problems: Arrest of dilation  Breast Feeding:  no Lochia: none Post partum depression/anxiety noted:  no Edinburgh Post-Partum Depression Score: 14  Date of last PAP: 2021 NIL   Any problems since the delivery:  reports she has been having worsening anxiety.  She reports that prior to pregnancy she was on citalopram for anxiety.  She does not feel like this fully improved her anxiety but did manage it somewhat.   Newborn Details:  SINGLETON :  1. Baby Gender:female.  Infant Status: Infant doing well at home with mother.   Review of Systems: ROS  Past Medical History:  Past Medical History:  Diagnosis Date   Anxiety    Cancer ([place])    GERD (gastroesophageal reflux disease)    WITH PREGNANCY ONLY   History of methicillin resistant staphylococcus aureus (MRSA) 2018   Migraines    MIGRAINES   S/P conization of cervix 11/23/2018   S/P LEEP 12/14/2018    Past Surgical History:  Past Surgical History:  Procedure Laterality Date   CERVICAL CONIZATION W/BX N/A 11/23/2018   Procedure: COLD KNIFE CONIZATION CERVIX WITH BIOPSY;  Surgeon: Rubie Maid, MD;  Location: [facility];  Service: Gynecology;  Laterality: N/A;   CESAREAN SECTION  04/06/2021   Procedure: CESAREAN SECTION;  Surgeon: Homero Fellers, MD;  Location: [facility];  Service: Obstetrics;;   CHOLECYSTECTOMY     ESOPHAGOGASTRODUODENOSCOPY (EGD) WITH PROPOFOL N/A 08/06/2017   Procedure: ESOPHAGOGASTRODUODENOSCOPY (EGD) WITH PROPOFOL;  Surgeon: Lucilla Lame, MD;  Location: [place];  Service: Endoscopy;  Laterality: N/A;   TONSILLECTOMY      Family History:  Family History  Problem Relation Age of Onset   Diabetes Mother    Heart  attack Father    Diabetes Paternal Uncle    Diabetes Maternal Grandmother    Pancreatitis Maternal Grandmother    Breast cancer Neg Hx    Ovarian cancer Neg Hx    Colon cancer Neg Hx     Social History:  Social History   Socioeconomic History   Marital status: Married    Spouse name: Not on file   Number of children: Not on file   Years of education: Not on file   Highest education level: Not on file  Occupational History   Not on file  Tobacco Use   Smoking status: Never   Smokeless tobacco: Never  Vaping Use   Vaping Use: Never used  Substance and Sexual Activity   Alcohol use: No   Drug use: No   Sexual activity: Yes    Birth control/protection: None    Comment: undecided  Other Topics Concern   Not on file  Social History Narrative   Lives at home, independent   Social Determinants of Health   Financial Resource Strain: Not on file  Food Insecurity: Not on file  Transportation Needs: Not on file  Physical Activity: Not on file  Stress: Not on file  Social Connections: Not on file  Intimate Partner Violence: Not on file    Allergies:  Allergies  Allergen Reactions   Ampicillin Rash    Did it involve swelling of the face/tongue/throat, SOB, or low BP? No Did it involve sudden or severe rash/hives, skin peeling,  or any reaction on the inside of your mouth or nose? No Did you need to seek medical attention at a hospital or doctor's office? No When did it last happen?      3-4 years ago If all above answers are "NO", may proceed with cephalosporin use.   Morphine And Related Itching and Rash   Penicillins Rash    Did it involve swelling of the face/tongue/throat, SOB, or low BP? No Did it involve sudden or severe rash/hives, skin peeling, or any reaction on the inside of your mouth or nose? No Did you need to seek medical attention at a hospital or doctor's office? No When did it last happen?      3-4 years ago If all above answers are "NO", may proceed  with cephalosporin use.    Sulfa Antibiotics Rash    Medications: Prior to Admission medications   Medication Sig Start Date End Date Taking? Authorizing Provider  norethindrone-ethinyl estradiol-FE (JUNEL FE 1/20) 1-20 MG-MCG tablet Take 1 tablet by mouth daily. 05/17/21  Yes Mettie Roylance R, MD  sertraline (ZOLOFT) 50 MG tablet Take 1 tablet (50 mg total) by mouth daily. 05/17/21  Yes Marianny Goris, Stefanie Libel, MD  Prenatal Vit-Fe Fumarate-FA (PRENATAL MULTIVITAMIN) TABS tablet Take 1 tablet by mouth daily at 12 noon.    [provider]    Physical Exam Vitals:  Vitals:   05/17/21 1431  BP: 120/70    Physical Exam Constitutional:      Appearance: Normal appearance. She is well-developed.  HENT:     Head: Normocephalic and atraumatic.  Eyes:     Extraocular Movements: Extraocular movements intact.     Pupils: Pupils are equal, round, and reactive to light.  Neck:     Thyroid: No thyromegaly.  Cardiovascular:     Rate and Rhythm: Normal rate and regular rhythm.     Heart sounds: Normal heart sounds.  Pulmonary:     Effort: Pulmonary effort is normal.     Breath sounds: Normal breath sounds.  Abdominal:     General: Bowel sounds are normal. There is no distension.     Palpations: Abdomen is soft. There is no mass.     Comments: Incision is clean dry and intact  Musculoskeletal:     Cervical back: Neck supple.  Neurological:     Mental Status: She is alert and oriented to person, place, and time.  Skin:    General: Skin is warm and dry.  Psychiatric:        Behavior: Behavior normal.        Thought Content: Thought content normal.        Judgment: Judgment normal.  Vitals reviewed.    Assessment: 35 y.o. [id number] presenting for 6 week postpartum visit  Plan: Problem List Items Addressed This Visit       Other   Postpartum care following cesarean delivery - Primary   Other Visit Diagnoses     Postpartum depression       Relevant Medications    sertraline (ZOLOFT) 50 MG tablet   Encounter for BCP (birth control pills) initial prescription       Relevant Medications   norethindrone-ethinyl estradiol-FE (JUNEL FE 1/20) 1-20 MG-MCG tablet   S/P cesarean section           1) Contraception-  rx for Junel sent  2)  Pap: up to date  3) Patient underwent screening for postpartum depression with concerns noted. Will start zoloft 50 mg. She can  increase to 100 mg in 2 weeks if not having symptoms improvement. Will follow up in 4 weeks. Provided with list of local therapists.  Discussed the rest of the therapy with the patient.  She reports is not interested in this [place] fusion at this time.  4) - Follow up in 4 weeks    Adrian Prows MD, [place], [facility] 05/17/2021 2:51 PM

## 2021-05-17 NOTE — Patient Instructions (Signed)
[facility] Same Day Access Hours:  Monday, Wednesday and Friday, 8am - 3pm Walk-In Crisis Hours: 7 days/wk, 8am - 8pm [address], [place], [place] [address] [phone]  [employer] [phone]  Mobile Crisis Unit [phone]   Therapists/Counselors/Psychologists  [facility]ari [facility] [address] [facility] [address] [place], [place] [address] [phone]  Karen [place], [place]    [phone]        [place], [place] [address]        Peggye Ley, [place] [phone] [place], [place] [address]  Lady Deutscher, [place]        [phone]        [id number]      [place], [place] [address]        Bettey Mare, [place] [phone] [address] [place], [place] [address]   Dimas Aguas, LMFT       [phone]        [address], [place] [address]        Miguel Dibble [phone] [address] [place], [place] [address]  Rhona Raider        [phone]        [address]       [place], [place] [address]        Sena Hitch [phone] [address] [place], [place] [address]  Lestine Box, [place]       [phone]        [address]       [place], [place] [address]        Marjie Skiff, [place] [phone] [address]  [place], [place] [address]   Einar Gip        [phone]        [address] [place], [place] [address]         [place] [phone] [email or web address]   Sation Konchella       [phone]        [address], [place] [address]        [place] [phone] [email or web address]     Perinatal Mood and Anxiety Disorder Perinatal mood and anxiety disorder (PMAD) is a mental health condition that happens when a person feels excessive  sadness, anger, or worry and tension (anxiety) during pregnancy or during the first few months after the birth. This condition can last a few months or may continue for years if left untreated. PMAD may cause serious problems for the mother, her baby, or the father if not properly managed. Depression and anxiety can interfere with the ability to take care of the baby. It also may affect work, school, relationships, and other everyday activities. Having the baby blues is considered normal. Mild to moderate levels of sadness, exhaustion, and generally struggling with being a parent are considered the blues. Many parents experience these during the first 1-2 weeks after giving birth. If these symptoms become worse or last too long, it may be PMAD. What are the causes? The exact cause of this condition is not known. It may result from a combination of hormone changes and biological, social, and psychological factors. What increases the risk? The following  factors may make you more likely to develop this condition: Having a personal or family history of depression, anxiety, or mood disorders. Experiencing a stressful life event during pregnancy, such as the death of a loved one. Having additional life stress, such as being a single parent. Having thyroid problems. What are the signs or symptoms? Symptoms of this condition include: Physical symptoms, such as: Panic attacks. These are intense episodes of fear or discomfort that may also cause sweating, nausea, shortness of breath, or fear of dying. They usually last 5-15 minutes but can last longer. Performing repetitive tasks to relieve stress or worry (obsessive compulsive disorder, or OCD). Problems with appetite or sleep. Emotional symptoms, such as: Excessive worry about problems or feeling like something bad will happen (generalized anxiety disorder). Phobias, which are fears of certain objects or situations. Separation anxiety, or fear and stress  about leaving certain people or loved ones. Behavioral symptoms, such as: Depression, or lack of motivation and energy. Intense mood swings involving emotional highs and lows. Feeling out of control or like you are going crazy. Having difficulty bonding with your baby. Some people also have trouble relaxing, problems concentrating, problems sleeping, frequent nightmares, and disturbing thoughts. PMAD can be different for everyone and can affect men as well as women. How is this diagnosed? This condition is diagnosed based on a physical exam and mental evaluation. In some cases, your health care provider may use an anxiety or depression screening tool. This includes a list of questions that can help a health care provider diagnose PMAD. You may be referred to a mental health expert who specializes in treating PMAD. How is this treated? This condition may be treated with: Talk therapy with a mental health professional. This may be family therapy, marriage therapy, cognitive behavioral therapy, or interpersonal therapy. Medicines. Your health care provider will discuss medicines that are safe to use during pregnancy and breastfeeding. Stress reduction therapies, such as mindfulness, deep breathing, or guided muscle relaxation. Support groups, early childhood education, or other groups to help with being a parent. Follow these instructions at home: Lifestyle Do not use any products that contain nicotine or tobacco. These products include cigarettes, chewing tobacco, and vaping devices, such as e-cigarettes. If you need help quitting, ask your health care provider. Do not drink alcohol when you are pregnant. It is also safest not to drink alcohol if you are breastfeeding. After your baby is born, if you drink alcohol: Limit how much you have to 0-1 drink a day. Be aware of how much alcohol is in your drink. In the [place]., one drink equals one 12 oz bottle of beer (355 mL), one 5 oz glass of wine (148  mL), or one 1 oz glass of hard liquor (44 mL). Consider joining a support group for new mothers. Ask your health care provider for recommendations. Take good care of yourself. Make sure you: Get as much rest as possible. Talk with your partner about sharing the responsibility of getting up with your baby if possible. Make sleep a priority. Eat a healthy diet. This includes plenty of fruits and vegetables, whole grains, and lean proteins. Exercise regularly, as told by your health care provider. Ask your health care provider what exercises are safe for you. Talk with your partner about making sure you both have opportunities to exercise. General instructions Take over-the-counter and prescription medicines only as told by your health care provider. Talk with your partner or family members about your feelings during pregnancy. Share your  concerns, needs, or anxieties with each other. Do not be afraid to ask for help. Find a mental health professional, if needed. Ask for help with tasks or chores when you need it. Ask friends and family members to provide meals, watch your children, or help with cleaning. If friends or family are not able to help, consider finding a licensed child care provider or professional house cleaner if needed. Let your partner know what you need. He or she may be struggling too. Keep all follow-up visits. This is important. Contact a health care provider if: You or people close to you notice that you have symptoms of anxiety or depression. Your symptoms of anxiety or depression get worse. You take medicines and have side effects that are uncomfortable or difficult to tolerate. Get help right away if: You feel like hurting yourself, your baby, or someone else. If you feel like you may hurt yourself or others, or have thoughts about taking your own life, get help right away. Go to your nearest emergency department or: Call your local emergency services (911 in the [place].). Call  a suicide crisis helpline, such as the [facility] at 781 101 2766. This is open 24 hours a day in the [place]. Text the Crisis Text Line at 7781627841 (in the [place] Summary Perinatal mood and anxiety disorder (PMAD) is when a woman or her partner feels excessive sadness, anger, or worry and tension (anxiety) during pregnancy or during the first few months after the birth. PMAD may include depression, intense mood swings, panic attacks, separation anxiety, phobias, or generalized anxiety. PMAD can cause problems for the mother, the baby, or the father if not properly managed. This condition is treated with medicines, talk therapy, stress reduction therapies, or a combination of treatments. Talk with your partner or family members about your concerns or fears. Ask for help when you need it. This information is not intended to replace advice given to you by your health care provider. Make sure you discuss any questions you have with your health care provider. Document Revised: 02/25/2020 Document Reviewed: 02/25/2020 Elsevier Patient Education  [place].

## 2021-06-15 ENCOUNTER — Ambulatory Visit: Payer: Managed Care, Other (non HMO) | Admitting: Obstetrics and Gynecology

## 2021-06-27 ENCOUNTER — Other Ambulatory Visit: Payer: Self-pay

## 2021-06-27 ENCOUNTER — Encounter: Payer: Self-pay | Admitting: Obstetrics and Gynecology

## 2021-06-27 ENCOUNTER — Ambulatory Visit (INDEPENDENT_AMBULATORY_CARE_PROVIDER_SITE_OTHER): Payer: Managed Care, Other (non HMO) | Admitting: Obstetrics and Gynecology

## 2021-06-27 VITALS — BP 134/80 | Wt 165.0 lb

## 2021-06-27 DIAGNOSIS — F53 Postpartum depression: Secondary | ICD-10-CM | POA: Diagnosis not present

## 2021-06-27 DIAGNOSIS — F419 Anxiety disorder, unspecified: Secondary | ICD-10-CM

## 2021-06-27 MED ORDER — BUSPIRONE HCL 5 MG PO TABS
10.0000 mg | ORAL_TABLET | Freq: Two times a day (BID) | ORAL | 0 refills | Status: AC
Start: 2021-06-27 — End: 2021-07-27

## 2021-06-27 NOTE — Progress Notes (Signed)
Obstetrics & Gynecology Office Visit   Chief Complaint:  Chief Complaint  Patient presents with   Follow-up  Postpartum depression  History of Present Illness: 35 y.o. [id number] female who presents in follow up for postpartum depression. She was started on Zoloft 50 mg on 05/17/2021 with the option of increasing to 100mg , if no significant improvement in 1-2 weeks. Her EPDS score was 14. Today her EPDS is 8 (zero on self harm).  She is currently taking 50 mg of zoloft.   She is not breastfeeding.  She is not seeing a therapist.  Anxiety seems to be the biggest component.   Past Medical History:  Diagnosis Date   Anxiety    Cancer ([place])    GERD (gastroesophageal reflux disease)    WITH PREGNANCY ONLY   History of methicillin resistant staphylococcus aureus (MRSA) 2018   Migraines    MIGRAINES   S/P conization of cervix 11/23/2018   S/P LEEP 12/14/2018    Past Surgical History:  Procedure Laterality Date   CERVICAL CONIZATION W/BX N/A 11/23/2018   Procedure: COLD KNIFE CONIZATION CERVIX WITH BIOPSY;  Surgeon: Rubie Maid, MD;  Location: [facility];  Service: Gynecology;  Laterality: N/A;   CESAREAN SECTION  04/06/2021   Procedure: CESAREAN SECTION;  Surgeon: Homero Fellers, MD;  Location: [facility];  Service: Obstetrics;;   CHOLECYSTECTOMY     ESOPHAGOGASTRODUODENOSCOPY (EGD) WITH PROPOFOL N/A 08/06/2017   Procedure: ESOPHAGOGASTRODUODENOSCOPY (EGD) WITH PROPOFOL;  Surgeon: Lucilla Lame, MD;  Location: [facility];  Service: Endoscopy;  Laterality: N/A;   TONSILLECTOMY      Gynecologic History: Patient's last menstrual period was 06/13/2021.  Obstetric History: [id number]  Family History  Problem Relation Age of Onset   Diabetes Mother    Heart attack Father    Diabetes Paternal Uncle    Diabetes Maternal Grandmother    Pancreatitis Maternal Grandmother    Breast cancer Neg Hx    Ovarian cancer Neg Hx    Colon cancer Neg Hx     Social History   Socioeconomic  History   Marital status: Married    Spouse name: Not on file   Number of children: Not on file   Years of education: Not on file   Highest education level: Not on file  Occupational History   Not on file  Tobacco Use   Smoking status: Never   Smokeless tobacco: Never  Vaping Use   Vaping Use: Never used  Substance and Sexual Activity   Alcohol use: No   Drug use: No   Sexual activity: Yes    Birth control/protection: None    Comment: undecided  Other Topics Concern   Not on file  Social History Narrative   Lives at home, independent   Social Determinants of Health   Financial Resource Strain: Not on file  Food Insecurity: Not on file  Transportation Needs: Not on file  Physical Activity: Not on file  Stress: Not on file  Social Connections: Not on file  Intimate Partner Violence: Not on file    Allergies  Allergen Reactions   Ampicillin Rash    Did it involve swelling of the face/tongue/throat, SOB, or low BP? No Did it involve sudden or severe rash/hives, skin peeling, or any reaction on the inside of your mouth or nose? No Did you need to seek medical attention at a hospital or doctor's office? No When did it last happen?      3-4 years ago If all above answers are "  NO", may proceed with cephalosporin use.   Morphine And Related Itching and Rash   Penicillins Rash    Did it involve swelling of the face/tongue/throat, SOB, or low BP? No Did it involve sudden or severe rash/hives, skin peeling, or any reaction on the inside of your mouth or nose? No Did you need to seek medical attention at a hospital or doctor's office? No When did it last happen?      3-4 years ago If all above answers are "NO", may proceed with cephalosporin use.    Sulfa Antibiotics Rash    Prior to Admission medications   Medication Sig Start Date End Date Taking? Authorizing Provider  norethindrone-ethinyl estradiol-FE (JUNEL FE 1/20) 1-20 MG-MCG tablet Take 1 tablet by mouth daily.  05/17/21   Schuman, Stefanie Libel, MD  Prenatal Vit-Fe Fumarate-FA (PRENATAL MULTIVITAMIN) TABS tablet Take 1 tablet by mouth daily at 12 noon.    [provider]  sertraline (ZOLOFT) 50 MG tablet Take 1 tablet (50 mg total) by mouth daily. 05/17/21   Homero Fellers, MD    Review of Systems  Constitutional: Negative.   HENT: Negative.    Eyes: Negative.   Respiratory: Negative.    Cardiovascular: Negative.   Gastrointestinal: Negative.   Genitourinary: Negative.   Musculoskeletal: Negative.   Skin: Negative.   Neurological: Negative.   Psychiatric/Behavioral:  Positive for depression. Negative for hallucinations, memory loss, substance abuse and suicidal ideas. The patient is nervous/anxious. The patient does not have insomnia.     Physical Exam BP 134/80   Wt 165 lb (74.8 kg)   LMP 06/13/2021   BMI 30.97 kg/m  Patient's last menstrual period was 06/13/2021. Physical Exam Constitutional:      General: She is not in acute distress.    Appearance: Normal appearance.  HENT:     Head: Normocephalic and atraumatic.  Eyes:     General: No scleral icterus.    Conjunctiva/sclera: Conjunctivae normal.  Neurological:     General: No focal deficit present.     Mental Status: She is alert and oriented to person, place, and time.     Cranial Nerves: No cranial nerve deficit.  Psychiatric:        Mood and Affect: Mood normal.        Behavior: Behavior normal.        Judgment: Judgment normal.    Female chaperone present for pelvic and breast  portions of the physical exam  Assessment: 35 y.o. [id number] female here for  1. Postpartum depression   2. Anxiety      Plan: Problem List Items Addressed This Visit   None Visit Diagnoses     Postpartum depression    -  Primary   Relevant Medications   busPIRone (BUSPAR) 5 MG tablet   Anxiety       Relevant Medications   busPIRone (BUSPAR) 5 MG tablet      Overall, she is improving.  However, she is not quite where  she wants to be.  Zoloft is causing somnolence.  However, she elects to continue Zoloft at current dose and add buspirone.  We will start buspirone building to 10 mg twice a day.  We will have her follow-up in 6 weeks to assess response.  A total of 22 minutes were spent face-to-face with the patient as well as preparation, review, communication, and documentation during this encounter.    Prentice Docker, MD 06/27/2021 2:25 PM

## 2021-08-11 ENCOUNTER — Other Ambulatory Visit: Payer: Self-pay | Admitting: Obstetrics and Gynecology

## 2021-08-11 DIAGNOSIS — F53 Postpartum depression: Secondary | ICD-10-CM

## 2021-08-15 ENCOUNTER — Ambulatory Visit: Payer: Managed Care, Other (non HMO) | Admitting: Obstetrics and Gynecology

## 2021-08-16 ENCOUNTER — Other Ambulatory Visit: Payer: Self-pay

## 2021-08-16 ENCOUNTER — Encounter: Payer: Self-pay | Admitting: Obstetrics and Gynecology

## 2021-08-16 ENCOUNTER — Ambulatory Visit (INDEPENDENT_AMBULATORY_CARE_PROVIDER_SITE_OTHER): Payer: Managed Care, Other (non HMO) | Admitting: Obstetrics and Gynecology

## 2021-08-16 DIAGNOSIS — F53 Postpartum depression: Secondary | ICD-10-CM | POA: Diagnosis not present

## 2021-08-16 MED ORDER — SERTRALINE HCL 50 MG PO TABS: 50.0000 mg | ORAL_TABLET | Freq: Every day | ORAL | 11 refills | Status: DC

## 2021-08-16 MED ORDER — BUSPIRONE HCL 10 MG PO TABS: 10.0000 mg | ORAL_TABLET | Freq: Two times a day (BID) | ORAL | 11 refills | Status: DC

## 2021-08-16 NOTE — Progress Notes (Signed)
Obstetrics & Gynecology Office Visit   Chief Complaint:  Chief Complaint  Patient presents with   Follow-up    Anxiety - no concerns. RM 2    History of Present Illness: The patient is a 35 y.o. female presenting follow up for symptoms of anxiety and depression.  The patient is currently taking Zoloft 50mg  for the management of her symptoms.  She has not had any recent situational stressors.  She reports good improvement in symptoms. She denies anhedonia, insomnia, risk taking behavior, irritability, agorophobia, feelings of guilt, feelings of worthlessness, suicidal ideation, homicidal ideation, auditory hallucinations, and visual hallucinations. Symptoms have improved since last visit.     Review of Systems: Review of Systems  Constitutional: Negative.   Gastrointestinal:  Negative for nausea and vomiting.  Neurological:  Negative for headaches.  Psychiatric/Behavioral: Negative.      Past Medical History:  Past Medical History:  Diagnosis Date   Anxiety    Cancer ([place])    GERD (gastroesophageal reflux disease)    WITH PREGNANCY ONLY   History of methicillin resistant staphylococcus aureus (MRSA) 2018   Migraines    MIGRAINES   S/P conization of cervix 11/23/2018   S/P LEEP 12/14/2018    Past Surgical History:  Past Surgical History:  Procedure Laterality Date   CERVICAL CONIZATION W/BX N/A 11/23/2018   Procedure: COLD KNIFE CONIZATION CERVIX WITH BIOPSY;  Surgeon: Rubie Maid, MD;  Location: [facility];  Service: Gynecology;  Laterality: N/A;   CESAREAN SECTION  04/06/2021   Procedure: CESAREAN SECTION;  Surgeon: Homero Fellers, MD;  Location: [facility];  Service: Obstetrics;;   CHOLECYSTECTOMY     ESOPHAGOGASTRODUODENOSCOPY (EGD) WITH PROPOFOL N/A 08/06/2017   Procedure: ESOPHAGOGASTRODUODENOSCOPY (EGD) WITH PROPOFOL;  Surgeon: Lucilla Lame, MD;  Location: [facility];  Service: Endoscopy;  Laterality: N/A;   TONSILLECTOMY      Gynecologic History: No  LMP recorded.  Obstetric History: [id number]  Family History:  Family History  Problem Relation Age of Onset   Diabetes Mother    Heart attack Father    Diabetes Paternal Uncle    Diabetes Maternal Grandmother    Pancreatitis Maternal Grandmother    Breast cancer Neg Hx    Ovarian cancer Neg Hx    Colon cancer Neg Hx     Social History:  Social History   Socioeconomic History   Marital status: Married    Spouse name: Not on file   Number of children: Not on file   Years of education: Not on file   Highest education level: Not on file  Occupational History   Not on file  Tobacco Use   Smoking status: Never   Smokeless tobacco: Never  Vaping Use   Vaping Use: Never used  Substance and Sexual Activity   Alcohol use: No   Drug use: No   Sexual activity: Yes    Birth control/protection: Pill    Comment: undecided  Other Topics Concern   Not on file  Social History Narrative   Lives at home, independent   Social Determinants of Health   Financial Resource Strain: Not on file  Food Insecurity: Not on file  Transportation Needs: Not on file  Physical Activity: Not on file  Stress: Not on file  Social Connections: Not on file  Intimate Partner Violence: Not on file    Allergies:  Allergies  Allergen Reactions   Ampicillin Rash    Did it involve swelling of the face/tongue/throat, SOB, or low  BP? No Did it involve sudden or severe rash/hives, skin peeling, or any reaction on the inside of your mouth or nose? No Did you need to seek medical attention at a hospital or doctor's office? No When did it last happen?      3-4 years ago If all above answers are "NO", may proceed with cephalosporin use.   Morphine And Related Itching and Rash   Penicillins Rash    Did it involve swelling of the face/tongue/throat, SOB, or low BP? No Did it involve sudden or severe rash/hives, skin peeling, or any reaction on the inside of your mouth or nose? No Did you need to seek  medical attention at a hospital or doctor's office? No When did it last happen?      3-4 years ago If all above answers are "NO", may proceed with cephalosporin use.    Sulfa Antibiotics Rash    Medications: Prior to Admission medications   Medication Sig Start Date End Date Taking? Authorizing Provider  Butalbital-APAP-Caffeine 50-325-40 MG capsule Take 1-2 capsules by mouth every 6 (six) hours as needed. 06/25/21  Yes [provider]  norethindrone-ethinyl estradiol-FE (JUNEL FE 1/20) 1-20 MG-MCG tablet Take 1 tablet by mouth daily. 05/17/21  Yes Schuman, Christanna R, MD  sertraline (ZOLOFT) 50 MG tablet Take 1 tablet (50 mg total) by mouth daily. 05/17/21  Yes Schuman, Stefanie Libel, MD  Prenatal Vit-Fe Fumarate-FA (PRENATAL MULTIVITAMIN) TABS tablet Take 1 tablet by mouth daily at 12 noon. Patient not taking: Reported on 08/16/2021    [provider]    Physical Exam Vitals:  Vitals:   08/16/21 1642  BP: 134/90   No LMP recorded.  General: NAD HEENT: normocephalic, anicteric Pulmonary: No increased work of breathing Neurologic: Grossly intact Psychiatric: mood appropriate, affect full    GAD 7 : Generalized Anxiety Score 08/16/2021 04/12/2019 06/08/2018  Nervous, Anxious, on Edge 1 0 3  Control/stop worrying 1 0 2  Worry too much - different things 1 0 2  Trouble relaxing 1 0 1  Restless 2 0 2  Easily annoyed or irritable 2 0 3  Afraid - awful might happen 1 0 1  Total GAD 7 Score 9 0 14  Anxiety Difficulty Not difficult at all - Somewhat difficult    Depression screen [facility] 2/9 08/16/2021 04/12/2019 06/08/2018  Decreased Interest 0 0 0  Down, Depressed, Hopeless 0 0 0  PHQ - 2 Score 0 0 0  Altered sleeping 0 0 0  Tired, decreased energy 1 3 1   Change in appetite 1 0 0  Feeling bad or failure about yourself  0 0 0  Trouble concentrating 1 0 0  Moving slowly or fidgety/restless 0 0 0  Suicidal thoughts 0 0 0  PHQ-9 Score 3 3 1   Difficult doing  work/chores Not difficult at all Not difficult at all -    Depression screen [facility] 2/9 08/16/2021 04/12/2019 06/08/2018 05/20/2018  Decreased Interest 0 0 0 0  Down, Depressed, Hopeless 0 0 0 0  PHQ - 2 Score 0 0 0 0  Altered sleeping 0 0 0 0  Tired, decreased energy 1 3 1  0  Change in appetite 1 0 0 0  Feeling bad or failure about yourself  0 0 0 0  Trouble concentrating 1 0 0 0  Moving slowly or fidgety/restless 0 0 0 0  Suicidal thoughts 0 0 0 0  PHQ-9 Score 3 3 1  0  Difficult doing work/chores Not difficult at all  Not difficult at all - -     Assessment: 35 y.o. [id number] medication follow up anxiety and depression  Plan: Problem List Items Addressed This Visit   None Visit Diagnoses     Postpartum depression       Relevant Medications   sertraline (ZOLOFT) 50 MG tablet   busPIRone (BUSPAR) 10 MG tablet       1) Anxiety/Depression  - continue Zoloft at 50mg , good response  2) Thyroid and B12 screen has not been obtained previously  3) A total of 15 minutes were spent in face-to-face contact with the patient during this encounter with over half of that time devoted to counseling and coordination of care.  4) Return in about 6 weeks (around 09/27/2021) for medication follow up phone.    Malachy Mood, MD, [facility], [facility] 08/16/2021, 5:02 PM

## 2021-09-27 ENCOUNTER — Ambulatory Visit (INDEPENDENT_AMBULATORY_CARE_PROVIDER_SITE_OTHER): Payer: Managed Care, Other (non HMO) | Admitting: Obstetrics and Gynecology

## 2021-09-27 ENCOUNTER — Encounter: Payer: Self-pay | Admitting: Obstetrics and Gynecology

## 2021-09-27 ENCOUNTER — Other Ambulatory Visit: Payer: Self-pay

## 2021-09-27 VITALS — BP 148/89 | Ht 61.0 in | Wt 172.0 lb

## 2021-09-27 DIAGNOSIS — F32A Depression, unspecified: Secondary | ICD-10-CM | POA: Diagnosis not present

## 2021-09-27 DIAGNOSIS — F419 Anxiety disorder, unspecified: Secondary | ICD-10-CM

## 2021-09-27 MED ORDER — BUSPIRONE HCL 15 MG PO TABS: 15.0000 mg | ORAL_TABLET | Freq: Two times a day (BID) | ORAL | 11 refills | Status: DC

## 2021-09-27 NOTE — Progress Notes (Signed)
Obstetrics & Gynecology Office Visit   Chief Complaint:  Chief Complaint  Patient presents with   Follow-up    Pp depression - RM 2    History of Present Illness: The patient is a 35 y.o. female presenting follow up for symptoms of anxiety and depression.  The patient is currently taking Zoloft 50mg   daily and Buspar 10mg  bidfor the management of her symptoms.  She has not had any recent situational stressors.  She reports good improvement in anxiety symptoms with buspar.  She denies anhedonia, day time somnolence, insomnia, risk taking behavior, irritability, social anxiety, agorophobia, feelings of guilt, feelings of worthlessness, suicidal ideation, homicidal ideation, auditory hallucinations, and visual hallucinations. Symptoms have improved since last visit.     The patient does have a pre-existing history of depression and anxiety.  She  does not a prior history of suicide attempts.    Review of Systems: Review of Systems  Constitutional: Negative.   Gastrointestinal: Negative.   Genitourinary: Negative.     Past Medical History:  Past Medical History:  Diagnosis Date   Anxiety    Cancer ([place])    GERD (gastroesophageal reflux disease)    WITH PREGNANCY ONLY   History of methicillin resistant staphylococcus aureus (MRSA) 2018   Migraines    MIGRAINES   S/P conization of cervix 11/23/2018   S/P LEEP 12/14/2018    Past Surgical History:  Past Surgical History:  Procedure Laterality Date   CERVICAL CONIZATION W/BX N/A 11/23/2018   Procedure: COLD KNIFE CONIZATION CERVIX WITH BIOPSY;  Surgeon: Rubie Maid, MD;  Location: [facility];  Service: Gynecology;  Laterality: N/A;   CESAREAN SECTION  04/06/2021   Procedure: CESAREAN SECTION;  Surgeon: Homero Fellers, MD;  Location: [facility];  Service: Obstetrics;;   CHOLECYSTECTOMY     ESOPHAGOGASTRODUODENOSCOPY (EGD) WITH PROPOFOL N/A 08/06/2017   Procedure: ESOPHAGOGASTRODUODENOSCOPY (EGD) WITH PROPOFOL;  Surgeon: Lucilla Lame, MD;  Location: [facility];  Service: Endoscopy;  Laterality: N/A;   TONSILLECTOMY      Gynecologic History: No LMP recorded.  Obstetric History: [id number]  Family History:  Family History  Problem Relation Age of Onset   Diabetes Mother    Heart attack Father    Diabetes Paternal Uncle    Diabetes Maternal Grandmother    Pancreatitis Maternal Grandmother    Breast cancer Neg Hx    Ovarian cancer Neg Hx    Colon cancer Neg Hx     Social History:  Social History   Socioeconomic History   Marital status: Married    Spouse name: Not on file   Number of children: Not on file   Years of education: Not on file   Highest education level: Not on file  Occupational History   Not on file  Tobacco Use   Smoking status: Never   Smokeless tobacco: Never  Vaping Use   Vaping Use: Never used  Substance and Sexual Activity   Alcohol use: No   Drug use: No   Sexual activity: Yes    Birth control/protection: Pill    Comment: undecided  Other Topics Concern   Not on file  Social History Narrative   Lives at home, independent   Social Determinants of Health   Financial Resource Strain: Not on file  Food Insecurity: Not on file  Transportation Needs: Not on file  Physical Activity: Not on file  Stress: Not on file  Social Connections: Not on file  Intimate Partner Violence: Not on  file    Allergies:  Allergies  Allergen Reactions   Ampicillin Rash    Did it involve swelling of the face/tongue/throat, SOB, or low BP? No Did it involve sudden or severe rash/hives, skin peeling, or any reaction on the inside of your mouth or nose? No Did you need to seek medical attention at a hospital or doctor's office? No When did it last happen?      3-4 years ago If all above answers are "NO", may proceed with cephalosporin use.   Morphine And Related Itching and Rash   Penicillins Rash    Did it involve swelling of the face/tongue/throat, SOB, or low BP? No Did it  involve sudden or severe rash/hives, skin peeling, or any reaction on the inside of your mouth or nose? No Did you need to seek medical attention at a hospital or doctor's office? No When did it last happen?      3-4 years ago If all above answers are NO, may proceed with cephalosporin use.    Sulfa Antibiotics Rash    Medications: Prior to Admission medications   Medication Sig Start Date End Date Taking? Authorizing Provider  busPIRone (BUSPAR) 10 MG tablet Take 1 tablet (10 mg total) by mouth 2 (two) times daily. 08/16/21   Malachy Mood, MD  Butalbital-APAP-Caffeine [phone] MG capsule Take 1-2 capsules by mouth every 6 (six) hours as needed. 06/25/21   [provider]  norethindrone-ethinyl estradiol-FE (JUNEL FE 1/20) 1-20 MG-MCG tablet Take 1 tablet by mouth daily. 05/17/21   Schuman, Stefanie Libel, MD  Prenatal Vit-Fe Fumarate-FA (PRENATAL MULTIVITAMIN) TABS tablet Take 1 tablet by mouth daily at 12 noon. Patient not taking: Reported on 08/16/2021    [provider]  sertraline (ZOLOFT) 50 MG tablet Take 1 tablet (50 mg total) by mouth daily. 08/16/21   Malachy Mood, MD    Physical Exam Vitals:  Vitals:   09/27/21 1633  BP: (!) 148/89   No LMP recorded.  General: NAD HEENT: normocephalic, anicteric Pulmonary: No increased work of breathing Neurologic: Grossly intact Psychiatric: mood appropriate, affect full    GAD 7 : Generalized Anxiety Score 09/27/2021 08/16/2021 04/12/2019 06/08/2018  Nervous, Anxious, on Edge 1 1 0 3  Control/stop worrying 0 1 0 2  Worry too much - different things 1 1 0 2  Trouble relaxing 1 1 0 1  Restless 0 2 0 2  Easily annoyed or irritable 1 2 0 3  Afraid - awful might happen 1 1 0 1  Total GAD 7 Score 5 9 0 14  Anxiety Difficulty Not difficult at all Not difficult at all - Somewhat difficult    Depression screen [facility] 2/9 09/27/2021 08/16/2021 04/12/2019  Decreased Interest 0 0 0  Down, Depressed, Hopeless 0 0 0  PHQ  - 2 Score 0 0 0  Altered sleeping 1 0 0  Tired, decreased energy 1 1 3   Change in appetite 0 1 0  Feeling bad or failure about yourself  0 0 0  Trouble concentrating 0 1 0  Moving slowly or fidgety/restless 0 0 0  Suicidal thoughts 0 0 0  PHQ-9 Score 2 3 3   Difficult doing work/chores Not difficult at all Not difficult at all Not difficult at all    Depression screen [facility] 2/9 09/27/2021 08/16/2021 04/12/2019 06/08/2018 05/20/2018  Decreased Interest 0 0 0 0 0  Down, Depressed, Hopeless 0 0 0 0 0  PHQ - 2 Score 0 0 0 0 0  Altered  sleeping 1 0 0 0 0  Tired, decreased energy 1 1 3 1  0  Change in appetite 0 1 0 0 0  Feeling bad or failure about yourself  0 0 0 0 0  Trouble concentrating 0 1 0 0 0  Moving slowly or fidgety/restless 0 0 0 0 0  Suicidal thoughts 0 0 0 0 0  PHQ-9 Score 2 3 3 1  0  Difficult doing work/chores Not difficult at all Not difficult at all Not difficult at all - -     Assessment: 36 y.o. [id number] follow up anxiety depression  Plan: Problem List Items Addressed This Visit   None Visit Diagnoses     Anxiety and depression    -  Primary   Relevant Medications   busPIRone (BUSPAR) 15 MG tablet       1) Anxiety/Depression - good overall imrpovement on GAD-7 PHQ-9 - maintain Zoloft at 50mg  - Increase buspar to 15mg  as still some anxiety noted  2) Thyroid and B12 screen has not been obtained previously  3) Return in about 11 months (around 08/27/2022) for [place], MD, [place], [facility] 09/27/2021, 4:39 PM

## 2023-12-04 ENCOUNTER — Ambulatory Visit: Payer: Managed Care, Other (non HMO) | Admitting: Nurse Practitioner

## 2023-12-18 ENCOUNTER — Encounter: Payer: Self-pay | Admitting: Nurse Practitioner

## 2023-12-18 ENCOUNTER — Ambulatory Visit (INDEPENDENT_AMBULATORY_CARE_PROVIDER_SITE_OTHER): Payer: Managed Care, Other (non HMO) | Admitting: Nurse Practitioner

## 2023-12-18 VITALS — BP 102/60 | HR 70 | Temp 98.4°F | Ht 61.0 in | Wt 174.6 lb

## 2023-12-18 DIAGNOSIS — Z3041 Encounter for surveillance of contraceptive pills: Secondary | ICD-10-CM | POA: Insufficient documentation

## 2023-12-18 DIAGNOSIS — F32A Depression, unspecified: Secondary | ICD-10-CM | POA: Diagnosis not present

## 2023-12-18 DIAGNOSIS — E669 Obesity, unspecified: Secondary | ICD-10-CM

## 2023-12-18 DIAGNOSIS — G43009 Migraine without aura, not intractable, without status migrainosus: Secondary | ICD-10-CM | POA: Insufficient documentation

## 2023-12-18 DIAGNOSIS — Z124 Encounter for screening for malignant neoplasm of cervix: Secondary | ICD-10-CM

## 2023-12-18 DIAGNOSIS — Z0000 Encounter for general adult medical examination without abnormal findings: Secondary | ICD-10-CM | POA: Diagnosis not present

## 2023-12-18 DIAGNOSIS — Z13220 Encounter for screening for lipoid disorders: Secondary | ICD-10-CM

## 2023-12-18 DIAGNOSIS — F419 Anxiety disorder, unspecified: Secondary | ICD-10-CM

## 2023-12-18 DIAGNOSIS — Z01419 Encounter for gynecological examination (general) (routine) without abnormal findings: Secondary | ICD-10-CM | POA: Insufficient documentation

## 2023-12-18 LAB — POCT URINE PREGNANCY: Preg Test, Ur: NEGATIVE

## 2023-12-18 MED ORDER — JUNEL FE 1/20 1-20 MG-MCG PO TABS: 1.0000 | ORAL_TABLET | Freq: Every day | ORAL | 3 refills | Status: DC

## 2023-12-18 MED ORDER — ESCITALOPRAM OXALATE 5 MG PO TABS: 5.0000 mg | ORAL_TABLET | Freq: Every day | ORAL | 0 refills | Status: DC

## 2023-12-18 NOTE — Progress Notes (Signed)
 Alexandria Dicker, NP-C Phone: [phone]  Alexandria Orozco is a 38 y.o. female who presents today to establish care and for annual exam.   Discussed the use of AI scribe software for clinical note transcription with the patient, who gave verbal consent to proceed.  History of Present Illness   Alexandria Orozco is a 38 year old female who presents for a routine physical exam and management of anxiety.  She has a history of anxiety and depression, previously managed with Zoloft and Buspar. Zoloft caused drowsiness, and she does not recall the effectiveness of Buspar. She is currently not on any medication for these conditions and feels she still needs something for anxiety management.  She is prescribed Wegovy and Phentermine for weight loss through a clinic. She has experienced significant weight fluctuation, losing about 50 pounds but regaining 30 pounds after discontinuing the medication. She attributes some weight challenges to dietary habits, particularly her consumption of Dr. Reino Orozco.  She has a history of irregular menstrual cycles, previously regulated by birth control. She has not taken birth control for about three weeks and reports her last period was in February. Her periods are irregular, with bleeding every other week in January.  She has experienced migraines since childhood, with headaches three to four times a week and migraines one to two times a month. She uses butalbital for migraine relief, which she takes when the migraines become severe.  Her past medical history includes gallbladder removal,  a C-section, and a cervical procedure following abnormal Pap smears. Family history is significant for her father recently passing from lung cancer. Her mother is diabetic. Socially, she does not smoke, drinks alcohol very rarely, and does not use drugs. She does not exercise regularly and prefers cooking at home. No chest pain, shortness of breath, abdominal pain, constipation,  diarrhea, burning when urinating, painful sex, dizziness, or trouble swallowing. Sleeps well for the most part despite having four children.      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   S/P conization of cervix 11/23/2018   Well woman exam with routine gynecological exam 12/18/2023   Anxiety and depression 12/18/2023   Surveillance for birth control, oral contraceptives 12/18/2023   Migraine without aura and without status migrainosus, not intractable 12/18/2023   Resolved Ambulatory Problems    Diagnosis Date Noted   Syncope and collapse 04/10/2016   Abdominal pain affecting pregnancy 04/11/2016   Indication for care in labor and delivery, antepartum 05/30/2016   Labor and delivery, indication for care 06/13/2016   History of preterm labor 06/14/2016   [redacted] weeks gestation of pregnancy 08/07/2016   Abdominal pain 07/20/2017   Abdominal pain, epigastric    Gastritis without bleeding    History of preterm delivery 11/24/2017   HSIL (high grade squamous intraepithelial lesion) on Pap smear of cervix 11/24/2017   Labor and delivery, indication for care 04/05/2018   Supervision of other normal pregnancy, antepartum 08/24/2020   Nausea and vomiting during pregnancy 08/24/2020   Headache in pregnancy, antepartum, first trimester 08/24/2020   History of preterm delivery, currently pregnant 10/12/2020   Labor and delivery indication for care or intervention 02/19/2021   Preterm uterine contractions in third trimester, antepartum    History of preterm delivery, currently pregnant in third trimester    Preterm uterine contractions, antepartum 02/21/2021   Labor and delivery, indication for care 03/06/2021   [redacted] weeks gestation of pregnancy 03/06/2021   Supervision  of high-risk pregnancy, third trimester    Encounter for elective induction of labor 04/05/2021   Cesarean delivery delivered    Arrest of dilation, delivered, current  hospitalization    Postpartum care following cesarean delivery 04/08/2021   Encounter for care or examination of lactating mother 04/08/2021   Past Medical History:  Diagnosis Date   Allergy    Anxiety    Cancer (HCC)    GERD (gastroesophageal reflux disease)    History of methicillin resistant staphylococcus aureus (MRSA) 2018   Migraines    S/P LEEP 12/14/2018    Family History  Problem Relation Age of Onset   Diabetes Mother    Varicose Veins Mother    Heart attack Father    Cancer Father    COPD Father    Diabetes Paternal Uncle    Diabetes Maternal Grandmother    Pancreatitis Maternal Grandmother    Stroke Maternal Grandmother    Diabetes Maternal Grandfather    Kidney disease Maternal Grandfather    Varicose Veins Maternal Grandfather    Vision loss Paternal Actor    ADD / ADHD Son    Breast cancer Neg Hx    Ovarian cancer Neg Hx    Colon cancer Neg Hx     Social History   Socioeconomic History   Marital status: Married    Spouse name: Not on file   Number of children: Not on file   Years of education: Not on file   Highest education level: Some college, no degree  Occupational History   Not on file  Tobacco Use   Smoking status: Never   Smokeless tobacco: Never  Vaping Use   Vaping status: Never Used  Substance and Sexual Activity   Alcohol use: No   Drug use: No   Sexual activity: Yes    Birth control/protection: Pill, Other-see comments, None    Comment: Current prescription ran out of refills  Other Topics Concern   Not on file  Social History Narrative   Lives at home, independent   Social Drivers of Health   Financial Resource Strain: Medium Risk (12/11/2023)   Overall Financial Resource Strain (CARDIA)    Difficulty of Paying Living Expenses: Somewhat hard  Food Insecurity: No Food Insecurity (12/11/2023)   Hunger Vital Sign    Worried About Running Out of Food in the Last Year: Never true    Ran Out of Food in the Last Year:  Never true  Transportation Needs: No Transportation Needs (12/11/2023)   PRAPARE - Administrator, Civil Service (Medical): No    Lack of Transportation (Non-Medical): No  Physical Activity: Unknown (12/11/2023)   Exercise Vital Sign    Days of Exercise per Week: 0 days    Minutes of Exercise per Session: Not on file  Stress: Stress Concern Present (12/11/2023)   [employer] of Occupational Health - Occupational Stress Questionnaire    Feeling of Stress : Rather much  Social Connections: Moderately Isolated (12/11/2023)   Social Connection and Isolation Panel [NHANES]    Frequency of Communication with Friends and Family: Once a week    Frequency of Social Gatherings with Friends and Family: Once a week    Attends Religious Services: More than 4 times per year    Active Member of [employer] or Organizations: No    Attends Engineer, structural: Not on file    Marital Status: Married  Catering manager Violence: Not on file    ROS  General:  Negative for unexplained weight loss, fever Skin: Negative for new or changing mole, sore that won't heal HEENT: Negative for trouble hearing, trouble seeing, ringing in ears, mouth sores, hoarseness, change in voice, dysphagia. CV:  Negative for chest pain, dyspnea, edema, palpitations Resp: Negative for cough, dyspnea, hemoptysis GI: Negative for nausea, vomiting, diarrhea, constipation, abdominal pain, melena, hematochezia. GU: Negative for dysuria, incontinence, urinary hesitance, hematuria, vaginal or penile discharge, polyuria, sexual difficulty, lumps in testicle or breasts MSK: Negative for muscle cramps or aches, joint pain or swelling Neuro: Negative for headaches, weakness, numbness, dizziness, passing out/fainting Psych: Negative for memory problems  Objective  Physical Exam Vitals:   12/18/23 0946  BP: 102/60  Pulse: 70  Temp: 98.4 F (36.9 C)  SpO2: 97%    BP Readings from Last 3 Encounters:  12/18/23  102/60  09/27/21 (!) 148/89  08/16/21 134/90   Wt Readings from Last 3 Encounters:  12/18/23 174 lb 9.6 oz (79.2 kg)  09/27/21 172 lb (78 kg)  08/16/21 168 lb (76.2 kg)    Physical Exam Exam conducted with a chaperone present Donavan Foil, CMA).  Constitutional:      General: She is not in acute distress.    Appearance: Normal appearance.  HENT:     Head: Normocephalic.     Right Ear: Tympanic membrane normal.     Left Ear: Tympanic membrane normal.     Nose: Nose normal.     Mouth/Throat:     Mouth: Mucous membranes are moist.     Pharynx: Oropharynx is clear.  Eyes:     Conjunctiva/sclera: Conjunctivae normal.     Pupils: Pupils are equal, round, and reactive to light.  Neck:     Thyroid: No thyromegaly.  Cardiovascular:     Rate and Rhythm: Normal rate and regular rhythm.     Heart sounds: Normal heart sounds.  Pulmonary:     Effort: Pulmonary effort is normal.     Breath sounds: Normal breath sounds.  Abdominal:     General: Abdomen is flat. Bowel sounds are normal.     Palpations: Abdomen is soft. There is no mass.     Tenderness: There is no abdominal tenderness.  Genitourinary:    General: Normal vulva.     Labia:        Right: No rash or lesion.        Left: No rash or lesion.      Vagina: Normal. No vaginal discharge or bleeding.     Cervix: Normal. No discharge, lesion or cervical bleeding.     Uterus: Normal.   Musculoskeletal:        General: Normal range of motion.  Lymphadenopathy:     Cervical: No cervical adenopathy.  Skin:    General: Skin is warm and dry.     Findings: No rash.  Neurological:     General: No focal deficit present.     Mental Status: She is alert.  Psychiatric:        Mood and Affect: Mood normal.        Behavior: Behavior normal.     Assessment/Plan:   Well woman exam with routine gynecological exam Assessment & Plan: Physical exam complete. We will order lab work as outlined and contact patient with results.  She is due for a Pap smear and has a family history of lung cancer. Tetanus vaccine is up to date. She declines flu and COVID vaccines. Discussed weight management with DGUYQI and phentermine, along with  a healthy diet and exercise. Perform a Pap smear today. Encourage lifestyle modifications for weight management. Continue routine dental and eye exams. Return to care in 4-6 weeks.   Orders: -     CBC with Differential/Platelet -     Comprehensive metabolic panel with GFR  Anxiety and depression Assessment & Plan: Anxiety is more prominent than depression. PHQ- 6 and GAD- 12 today. Zoloft caused drowsiness and Buspar was ineffective. Lexapro is recommended for anxiety with once-daily dosing. Effects are expected in 4-6 weeks. Start Lexapro 5 mg daily and schedule a follow-up in 4-6 weeks to assess response. Counseled patient on common side effects. Encouraged to contact if worsening symptoms, unusual behavior changes or suicidal thoughts occur.   Orders: -     TSH -     VITAMIN D 25 Hydroxy (Vit-D Deficiency, Fractures) -     Escitalopram Oxalate; Take 1 tablet (5 mg total) by mouth daily.  Dispense: 90 tablet; Refill: 0  Migraine without aura and without status migrainosus, not intractable Assessment & Plan: She experiences headaches 3-4 times weekly and migraines 1-2 times monthly, managed with butalbital. Address triggers such as stress, dehydration, and allergies. Encourage lifestyle modifications to address these triggers and continue butalbital as needed. We will continue to monitor for worsening or changing symptoms.    Surveillance for birth control, oral contraceptives Assessment & Plan: She experiences irregular cycles after discontinuing birth control. Negative pregnancy test in office. BP well controlled. Denies history of DVT/PE. She does not smoke. OCP refilled. Advised daily, consistent use.   Orders: -     Junel FE 1/20; Take 1 tablet by mouth daily.  Dispense: 84 tablet;  Refill: 3 -     POCT urine pregnancy  Obesity (BMI 30-39.9) -     Hemoglobin A1c  Lipid screening -     Lipid panel  Screening for cervical cancer -     IGP, Aptima HPV    Return in about 4 weeks (around 01/15/2024) for Anxiety/Depression.   Alexandria Dicker, NP-C [facility] - [facility]

## 2023-12-19 LAB — COMPREHENSIVE METABOLIC PANEL WITH GFR
ALT: 15 IU/L (ref 0–32)
AST: 17 IU/L (ref 0–40)
Albumin: 4.4 g/dL (ref 3.9–4.9)
Alkaline Phosphatase: 61 IU/L (ref 44–121)
BUN/Creatinine Ratio: 13 (ref 9–23)
BUN: 11 mg/dL (ref 6–20)
Bilirubin Total: 0.4 mg/dL (ref 0.0–1.2)
CO2: 21 mmol/L (ref 20–29)
Calcium: 9.4 mg/dL (ref 8.7–10.2)
Chloride: 104 mmol/L (ref 96–106)
Creatinine, Ser: 0.85 mg/dL (ref 0.57–1.00)
Globulin, Total: 2.7 g/dL (ref 1.5–4.5)
Glucose: 90 mg/dL (ref 70–99)
Potassium: 4.2 mmol/L (ref 3.5–5.2)
Sodium: 141 mmol/L (ref 134–144)
Total Protein: 7.1 g/dL (ref 6.0–8.5)
eGFR: 92 mL/min/{1.73_m2} (ref >59)

## 2023-12-19 LAB — CBC WITH DIFFERENTIAL/PLATELET
Basophils Absolute: 0 10*3/uL (ref 0.0–0.2)
Basos: 1 %
EOS (ABSOLUTE): 0.1 10*3/uL (ref 0.0–0.4)
Eos: 2 %
Hematocrit: 41.1 % (ref 34.0–46.6)
Hemoglobin: 13.4 g/dL (ref 11.1–15.9)
Immature Grans (Abs): 0 10*3/uL (ref 0.0–0.1)
Immature Granulocytes: 0 %
Lymphocytes Absolute: 2.6 10*3/uL (ref 0.7–3.1)
Lymphs: 44 %
MCH: 30.6 pg (ref 26.6–33.0)
MCHC: 32.6 g/dL (ref 31.5–35.7)
MCV: 94 fL (ref 79–97)
Monocytes Absolute: 0.6 10*3/uL (ref 0.1–0.9)
Monocytes: 9 %
Neutrophils Absolute: 2.6 10*3/uL (ref 1.4–7.0)
Neutrophils: 44 %
Platelets: 344 10*3/uL (ref 150–450)
RBC: 4.38 x10E6/uL (ref 3.77–5.28)
RDW: 12.6 % (ref 11.7–15.4)
WBC: 5.9 10*3/uL (ref 3.4–10.8)

## 2023-12-19 LAB — HEMOGLOBIN A1C
Est. average glucose Bld gHb Est-mCnc: 111 mg/dL
Hgb A1c MFr Bld: 5.5 % (ref 4.8–5.6)

## 2023-12-19 LAB — VITAMIN D 25 HYDROXY (VIT D DEFICIENCY, FRACTURES): Vit D, 25-Hydroxy: 15.9 ng/mL — ABNORMAL LOW (ref 30.0–100.0)

## 2023-12-19 LAB — LIPID PANEL
Chol/HDL Ratio: 3.4 ratio (ref 0.0–4.4)
Cholesterol, Total: 193 mg/dL (ref 100–199)
HDL: 57 mg/dL (ref >39)
LDL Chol Calc (NIH): 117 mg/dL — ABNORMAL HIGH (ref 0–99)
Triglycerides: 109 mg/dL (ref 0–149)
VLDL Cholesterol Cal: 19 mg/dL (ref 5–40)

## 2023-12-19 LAB — TSH: TSH: 1.17 u[IU]/mL (ref 0.450–4.500)

## 2023-12-24 ENCOUNTER — Encounter: Payer: Self-pay | Admitting: Nurse Practitioner

## 2023-12-24 NOTE — Assessment & Plan Note (Signed)
 She experiences headaches 3-4 times weekly and migraines 1-2 times monthly, managed with butalbital. Address triggers such as stress, dehydration, and allergies. Encourage lifestyle modifications to address these triggers and continue butalbital as needed. We will continue to monitor for worsening or changing symptoms.

## 2023-12-24 NOTE — Assessment & Plan Note (Signed)
 Anxiety is more prominent than depression. PHQ- 6 and GAD- 12 today. Zoloft caused drowsiness and Buspar was ineffective. Lexapro is recommended for anxiety with once-daily dosing. Effects are expected in 4-6 weeks. Start Lexapro 5 mg daily and schedule a follow-up in 4-6 weeks to assess response. Counseled patient on common side effects. Encouraged to contact if worsening symptoms, unusual behavior changes or suicidal thoughts occur.

## 2023-12-24 NOTE — Assessment & Plan Note (Signed)
 She experiences irregular cycles after discontinuing birth control. Negative pregnancy test in office. BP well controlled. Denies history of DVT/PE. She does not smoke. OCP refilled. Advised daily, consistent use.

## 2023-12-24 NOTE — Assessment & Plan Note (Signed)
 Physical exam complete. We will order lab work as outlined and contact patient with results. She is due for a Pap smear and has a family history of lung cancer. Tetanus vaccine is up to date. She declines flu and COVID vaccines. Discussed weight management with JYNWGN and phentermine, along with a healthy diet and exercise. Perform a Pap smear today. Encourage lifestyle modifications for weight management. Continue routine dental and eye exams. Return to care in 4-6 weeks.

## 2024-01-12 ENCOUNTER — Encounter: Payer: Self-pay | Admitting: Nurse Practitioner

## 2024-01-12 LAB — IGP, APTIMA HPV: HPV Aptima: NEGATIVE

## 2024-01-22 ENCOUNTER — Encounter: Payer: Self-pay | Admitting: Nurse Practitioner

## 2024-01-22 ENCOUNTER — Ambulatory Visit (INDEPENDENT_AMBULATORY_CARE_PROVIDER_SITE_OTHER): Admitting: Nurse Practitioner

## 2024-01-22 VITALS — BP 110/76 | HR 65 | Temp 98.4°F | Ht 61.0 in | Wt 177.2 lb

## 2024-01-22 DIAGNOSIS — F32A Depression, unspecified: Secondary | ICD-10-CM | POA: Diagnosis not present

## 2024-01-22 DIAGNOSIS — F419 Anxiety disorder, unspecified: Secondary | ICD-10-CM

## 2024-01-22 MED ORDER — ESCITALOPRAM OXALATE 10 MG PO TABS: 10.0000 mg | ORAL_TABLET | Freq: Every day | ORAL | 3 refills | Status: AC

## 2024-01-22 NOTE — Progress Notes (Signed)
 Bluford Burkitt, NP-C Phone: [phone]  Alexandria Orozco is a 38 y.o. female who presents today for follow up.   Discussed the use of AI scribe software for clinical note transcription with the patient, who gave verbal consent to proceed.  History of Present Illness   Alexandria Orozco is a 38 year old female who presents for a follow-up visit regarding her Lexapro  treatment for anxiety.  She started Lexapro  approximately five weeks ago for anxiety management. The medication has provided some relief, as evidenced by an improvement in her anxiety score from twelve to five. However, she feels it is not yet fully effective. She experiences drowsiness as a side effect, particularly when taking the medication in the morning. She usually takes at night, however if she forgets, she takes it the next morning.  She prefers Lexapro  over Zoloft , which she was previously taking. Her mood varies, with some days being better than others, and she notes that her mood is influenced by her children's behavior. No headaches, nausea, or other side effects apart from the occasional drowsiness.      Social History   Tobacco Use  Smoking Status Never  Smokeless Tobacco Never    Current Outpatient Medications on File Prior to Visit  Medication Sig Dispense Refill   Butalbital -APAP-Caffeine  50-325-40 MG capsule Take 1-2 capsules by mouth every 6 (six) hours as needed.     norethindrone -ethinyl estradiol-FE (JUNEL  FE 1/20) 1-20 MG-MCG tablet Take 1 tablet by mouth daily. 84 tablet 3   No current facility-administered medications on file prior to visit.     ROS see history of present illness  Objective  Physical Exam Vitals:   01/22/24 1040  BP: 110/76  Pulse: 65  Temp: 98.4 F (36.9 C)  SpO2: 99%    BP Readings from Last 3 Encounters:  01/22/24 110/76  12/18/23 102/60  09/27/21 (!) 148/89   Wt Readings from Last 3 Encounters:  01/22/24 177 lb 3.2 oz (80.4 kg)  12/18/23 174 lb 9.6 oz (79.2  kg)  09/27/21 172 lb (78 kg)    Physical Exam Constitutional:      General: She is not in acute distress.    Appearance: Normal appearance.  HENT:     Head: Normocephalic.  Cardiovascular:     Rate and Rhythm: Normal rate and regular rhythm.     Heart sounds: Normal heart sounds.  Pulmonary:     Effort: Pulmonary effort is normal.     Breath sounds: Normal breath sounds.  Skin:    General: Skin is warm and dry.  Neurological:     General: No focal deficit present.     Mental Status: She is alert.  Psychiatric:        Mood and Affect: Mood normal.        Behavior: Behavior normal.      Assessment/Plan: Please see individual problem list.  Anxiety and depression Assessment & Plan: Symptoms have improved with Lexapro , reducing GAD score from 12 to 5. PHQ- 6 today. Drowsiness is manageable by adjusting the dosing schedule. She prefers Lexapro  over Zoloft . Increasing the dose to 10 mg is expected to enhance control. Increase Lexapro  to 10 mg daily, using two 5 mg tablets until the current supply is exhausted, then switch to one 10 mg tablet. Advise taking Lexapro  at bedtime to manage drowsiness. Set an alarm for consistent medication adherence. Monitor for behavioral changes or unusual thoughts and report if occurs.  Orders: -     Escitalopram  Oxalate; Take  1 tablet (10 mg total) by mouth at bedtime.  Dispense: 90 tablet; Refill: 3     Return in about 11 months (around 12/22/2024) for Annual Exam, sooner as needed.   Bluford Burkitt, NP-C [facility]

## 2024-01-22 NOTE — Assessment & Plan Note (Signed)
 Symptoms have improved with Lexapro , reducing GAD score from 12 to 5. PHQ- 6 today. Drowsiness is manageable by adjusting the dosing schedule. She prefers Lexapro  over Zoloft . Increasing the dose to 10 mg is expected to enhance control. Increase Lexapro  to 10 mg daily, using two 5 mg tablets until the current supply is exhausted, then switch to one 10 mg tablet. Advise taking Lexapro  at bedtime to manage drowsiness. Set an alarm for consistent medication adherence. Monitor for behavioral changes or unusual thoughts and report if occurs.

## 2024-09-27 ENCOUNTER — Encounter: Payer: Self-pay | Admitting: Nurse Practitioner

## 2024-09-27 DIAGNOSIS — N911 Secondary amenorrhea: Secondary | ICD-10-CM

## 2024-09-29 ENCOUNTER — Ambulatory Visit: Payer: Self-pay | Admitting: Internal Medicine

## 2024-09-29 LAB — BETA HCG QUANT (REF LAB): hCG Quant: 10597 m[IU]/mL

## 2024-10-18 ENCOUNTER — Telehealth

## 2024-10-18 DIAGNOSIS — O3680X0 Pregnancy with inconclusive fetal viability, not applicable or unspecified: Secondary | ICD-10-CM

## 2024-10-18 DIAGNOSIS — Z8751 Personal history of pre-term labor: Secondary | ICD-10-CM | POA: Insufficient documentation

## 2024-10-18 DIAGNOSIS — Z98891 History of uterine scar from previous surgery: Secondary | ICD-10-CM | POA: Insufficient documentation

## 2024-10-18 DIAGNOSIS — Z3480 Encounter for supervision of other normal pregnancy, unspecified trimester: Secondary | ICD-10-CM | POA: Insufficient documentation

## 2024-10-18 NOTE — Patient Instructions (Signed)
 First Trimester of Pregnancy: What to Know  The first trimester of pregnancy starts on the first day of your last monthly period until the end of week 13. This is months 1 through 3 of pregnancy. A week after a sperm fertilizes an egg, the egg will implant into the wall of the uterus and begin to develop into a baby. Body changes during your first trimester Your body goes through many changes during pregnancy. The changes usually return to normal after your baby is born. Physical changes Your breasts may grow larger and may hurt. The area around your nipples may get darker. Your periods will stop. Your hair and nails may grow faster. You may pee more often. Health changes You may tire easily. Your gums may bleed and may be sensitive when you brush and floss. You may not feel hungry. You may have heartburn. You may throw up or feel like you may throw up. You may want to eat some foods, but not others. You may have headaches. You may have trouble pooping (constipation). Other changes Your emotions may change from day to day. You may have more dreams. Follow these instructions at home: Medicines Talk to your health care provider if you're taking medicines. Ask if the medicines are safe to take during pregnancy. Your provider may change the medicines that you take. Do not take any medicines unless told to by your provider. Take a prenatal vitamin that has at least 600 micrograms (mcg) of folic acid. Do not use herbal medicines, illegal substances, or medicines that are not approved by your provider. Eating and drinking While you're pregnant your body needs extra food for your growing baby. Talk with your provider about what to eat while pregnant. Activity Most women are able to exercise during pregnancy. Exercises may need to change as your pregnancy goes on. Talk to your provider about your activities and exercise routines. Relieving pain and discomfort Wear a good, supportive bra if  your breasts hurt. Rest with your legs raised if you have leg cramps or low back pain. Safety Wear your seatbelt at all times when you're in a car. Talk to your provider if someone hits you, hurts you, or yells at you. Talk with your provider if you're feeling sad or have thoughts of hurting yourself. Lifestyle Certain things can be harmful while you're pregnant. Follow these rules: Do not use hot tubs, steam rooms, or saunas. Do not douche. Do not use tampons or scented pads. Do not drink alcohol,smoke, vape, or use products with nicotine or tobacco in them. If you need help quitting, talk with your provider. Avoid cat litter boxes and soil used by cats. These things carry germs that can cause harm to your pregnancy and your baby. General instructions Keep all follow-up visits. It helps you and your unborn baby stay as healthy as possible. Write down your questions. Take them to your visits. Your provider will: Talk with you about your overall health. Give you advice or refer you to specialists who can help with different needs, including: Prenatal education classes. Mental health and counseling. Foods and healthy eating. Ask for help if you need help with food. Call your dentist and ask to be seen. Brush your teeth with a soft toothbrush. Floss gently. Where to find more information American Pregnancy Association: americanpregnancy.org [employer]: acog.org Office on [employer] Health: [place] Contact a health care provider if: You feel dizzy, faint, or have a fever. You vomit or have watery poop (  diarrhea) for 2 days or more. You have abnormal discharge or bleeding from your vagina. You have pain when you pee or your pee smells bad. You have cramps, pain, or pressure in your belly area. Get help right away if: You have trouble breathing or chest pain. You have any kind of injury, such as from a fall or a car crash. These symptoms may  be an emergency. Get help right away. Call 911. Do not wait to see if the symptoms will go away. Do not drive yourself to the hospital. This information is not intended to replace advice given to you by your health care provider. Make sure you discuss any questions you have with your health care provider. Document Revised: 07/12/2024 Document Reviewed: 01/03/2023 Elsevier Patient Education  2025 [employer].  Common Medications Safe in Pregnancy  Acne:      Constipation:  Benzoyl Peroxide     Colace  Clindamycin      Dulcolax Suppository  Topica Erythromycin     Fibercon  Salicylic Acid      Metamucil         Miralax  AVOID:        Senakot   Accutane    Cough:  Retin-A       Cough Drops  Tetracycline      Phenergan  w/ Codeine  if Rx  Minocycline      Robitussin (Plain & DM)  Antibiotics:     Crabs/Lice:  Ceclor       RID  Cephalosporins    AVOID:  E-Mycins      Kwell  Keflex  Macrobid /Macrodantin    Diarrhea:  Penicillin      Kao-Pectate  Zithromax       Imodium AD         PUSH FLUIDS AVOID:       Cipro     Fever:  Tetracycline      Tylenol  (Regular or Extra  Minocycline       Strength)  Levaquin      Extra Strength-Do not          Exceed 8 tabs/24 hrs Caffeine:        200mg /day (equiv. To 1 cup of coffee or  approx. 3 12 oz sodas)         Gas: Cold/Hayfever:       Gas-X  Benadryl       Mylicon  Claritin       Phazyme  **Claritin-D        Chlor-Trimeton    Headaches:  Dimetapp      ASA-Free Excedrin  Drixoral-Non-Drowsy     Cold Compress  Mucinex (Guaifenasin)     Tylenol  (Regular or Extra  Sudafed/Sudafed-12 Hour     Strength)  **Sudafed PE Pseudoephedrine   Tylenol  Cold & Sinus     Vicks Vapor Rub  Zyrtec  **AVOID if Problems With Blood Pressure         Heartburn: Avoid lying down for at least 1 hour after meals  Aciphex      Maalox     Rash:  Milk of Magnesia     Benadryl     Mylanta       1% Hydrocortisone Cream  Pepcid  Pepcid Complete   Sleep  Aids:  Prevacid      Ambien    Prilosec       Benadryl   Rolaids       Chamomile Tea  Tums (Limit 4/day)     Unisom  Tylenol  PM         Warm milk-add vanilla or  Hemorrhoids:       Sugar for taste  Anusol/Anusol H.C.  (RX: Analapram 2.5%)  Sugar Substitutes:  Hydrocortisone OTC     Ok in moderation  Preparation H      Tucks        Vaseline lotion applied to tissue with wiping    Herpes:     Throat:  Acyclovir      Oragel  Famvir  Valtrex     Vaccines:         Flu Shot Leg Cramps:       *Gardasil  Benadryl       Hepatitis A         Hepatitis B Nasal Spray:       Pneumovax  Saline Nasal Spray     Polio Booster         Tetanus Nausea:       Tuberculosis test or PPD  Vitamin B6 25 mg TID   AVOID:    Dramamine      *Gardasil  Emetrol       Live Poliovirus  Ginger Root 250 mg QID    MMR (measles, mumps &  High Complex Carbs @ Bedtime    rebella)  Sea Bands-Accupressure    Varicella (Chickenpox)  Unisom 1/2 tab TID     *No known complications           If received before Pain:         Known pregnancy;   Darvocet       Resume series after  Lortab        Delivery  Percocet    Yeast:   Tramadol      Femstat  Tylenol  3      Gyne-lotrimin  Ultram       Monistat  Vicodin           MISC:         All Sunscreens           Hair Coloring/highlights          Insect Repellant's          (Including DEET)         Mystic Tans   Commonly Asked Questions During Pregnancy  Cats: A parasite can be excreted in cat feces.  To avoid exposure you need to have another person empty the little box.  If you must empty the litter box you will need to wear gloves.  Wash your hands after handling your cat.  This parasite can also be found in raw or undercooked meat so this should also be avoided.  Colds, Sore Throats, Flu: Please check your medication sheet to see what you can take for symptoms.  If your symptoms are unrelieved by these medications please call the office.  Dental Work: Most  any dental work agricultural consultant recommends is permitted.  X-rays should only be taken during the first trimester if absolutely necessary.  Your abdomen should be shielded with a lead apron during all x-rays.  Please notify your provider prior to receiving any x-rays.  Novocaine is fine; gas is not recommended.  If your dentist requires a note from us  prior to dental work please call the office and we will provide one for you.  Exercise: Exercise is an important part of staying healthy during your pregnancy.  You may continue most exercises you were accustomed to prior to pregnancy.  Later  in your pregnancy you will most likely notice you have difficulty with activities requiring balance like riding a bicycle.  It is important that you listen to your body and avoid activities that put you at a higher risk of falling.  Adequate rest and staying well hydrated are a must!  If you have questions about the safety of specific activities ask your provider.    Exposure to Children with illness: Try to avoid obvious exposure; report any symptoms to us  when noted,  If you have chicken pos, red measles or mumps, you should be immune to these diseases.   Please do not take any vaccines while pregnant unless you have checked with your OB provider.  Fetal Movement: After 28 weeks we recommend you do kick counts twice daily.  Lie or sit down in a calm quiet environment and count your baby movements kicks.  You should feel your baby at least 10 times per hour.  If you have not felt 10 kicks within the first hour get up, walk around and have something sweet to eat or drink then repeat for an additional hour.  If count remains less than 10 per hour notify your provider.  Fumigating: Follow your pest control agent's advice as to how long to stay out of your home.  Ventilate the area well before re-entering.  Hemorrhoids:   Most over-the-counter preparations can be used during pregnancy.  Check your medication to see what is  safe to use.  It is important to use a stool softener or fiber in your diet and to drink lots of liquids.  If hemorrhoids seem to be getting worse please call the office.   Hot Tubs:  Hot tubs Jacuzzis and saunas are not recommended while pregnant.  These increase your internal body temperature and should be avoided.  Intercourse:  Sexual intercourse is safe during pregnancy as long as you are comfortable, unless otherwise advised by your provider.  Spotting may occur after intercourse; report any bright red bleeding that is heavier than spotting.  Labor:  If you know that you are in labor, please go to the hospital.  If you are unsure, please call the office and let us  help you decide what to do.  Lifting, straining, etc:  If your job requires heavy lifting or straining please check with your provider for any limitations.  Generally, you should not lift items heavier than that you can lift simply with your hands and arms (no back muscles)  Painting:  Paint fumes do not harm your pregnancy, but may make you ill and should be avoided if possible.  Latex or water based paints have less odor than oils.  Use adequate ventilation while painting.  Permanents & Hair Color:  Chemicals in hair dyes are not recommended as they cause increase hair dryness which can increase hair loss during pregnancy.   Highlighting and permanents are allowed.  Dye may be absorbed differently and permanents may not hold as well during pregnancy.  Sunbathing:  Use a sunscreen, as skin burns easily during pregnancy.  Drink plenty of fluids; avoid over heating.  Tanning Beds:  Because their possible side effects are still unknown, tanning beds are not recommended.  Ultrasound Scans:  Routine ultrasounds are performed at approximately 20 weeks.  You will be able to see your baby's general anatomy an if you would like to know the gender this can usually be determined as well.  If it is questionable when you conceived you may  also receive  an ultrasound early in your pregnancy for dating purposes.  Otherwise ultrasound exams are not routinely performed unless there is a medical necessity.  Although you can request a scan we ask that you pay for it when conducted because insurance does not cover  patient request scans.  Work: If your pregnancy proceeds without complications you may work until your due date, unless your physician or employer advises otherwise.  Round Ligament Pain/Pelvic Discomfort:  Sharp, shooting pains not associated with bleeding are fairly common, usually occurring in the second trimester of pregnancy.  They tend to be worse when standing up or when you remain standing for long periods of time.  These are the result of pressure of certain pelvic ligaments called round ligaments.  Rest, Tylenol  and heat seem to be the most effective relief.  As the womb and fetus grow, they rise out of the pelvis and the discomfort improves.  Please notify the office if your pain seems different than that described.  It may represent a more serious condition.

## 2024-10-18 NOTE — Progress Notes (Signed)
 New OB Intake  I connected with  Alexandria Orozco on 10/18/24 at 10:15 AM EST by MyChart Video Visit and verified that I am speaking with the correct person using two identifiers. Nurse is located at [facility] and pt is located at home.  I discussed the limitations, risks, security and privacy concerns of performing an evaluation and management service by telephone and the availability of in person appointments. I also discussed with the patient that there may be a patient responsible charge related to this service. The patient expressed understanding and agreed to proceed.  I explained I am completing New OB Intake today. We discussed her EDD of 05/12/25 that is based on LMP of 08/05/24. Pt is G6/P4. I reviewed her allergies, medications, Medical/Surgical/OB history, and appropriate screenings. There are cats in the home: no. Based on history, this is a/an pregnancy uncomplicated . Her obstetrical history is significant for N/A.  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  Anatomy US  Explained first scheduled US  will be 10/22/24. Anatomy US  will be scheduled around [redacted] weeks gestational age.  Labs Discussed genetic screening with patient. Patient desires genetic testing to be drawn at new OB visit. Discussed possible labs to be drawn at new OB appointment.  COVID Vaccine Patient has not had COVID vaccine.   Social Determinants of Health Food Insecurity: denies food insecurity Transportation: Patient denies transportation  needs. Childcare: Discussed no children allowed at ultrasound appointments.   First visit review I reviewed new OB appt with pt. I explained she will have blood work and pap smear/pelvic exam if indicated. Explained pt will be seen by Jinnie Cookey, CNM at first visit; encounter routed to appropriate provider.   Beola Skeens, CMA 10/18/2024  10:43 AM

## 2024-10-22 ENCOUNTER — Other Ambulatory Visit

## 2024-10-22 DIAGNOSIS — O3680X0 Pregnancy with inconclusive fetal viability, not applicable or unspecified: Secondary | ICD-10-CM

## 2024-11-02 ENCOUNTER — Encounter: Admitting: Licensed Practical Nurse

## 2024-11-16 ENCOUNTER — Encounter: Admitting: Certified Nurse Midwife

## 2024-12-23 ENCOUNTER — Encounter: Admitting: Nurse Practitioner
